# Patient Record
Sex: Female | Born: 1995 | Race: Black or African American | Hispanic: No | Marital: Single
Health system: Midwestern US, Community
[De-identification: ages and names within clinical notes are randomized; demographics above are authoritative.]

## PROBLEM LIST (undated history)

## (undated) DIAGNOSIS — F319 Bipolar disorder, unspecified: Secondary | ICD-10-CM

## (undated) DIAGNOSIS — F329 Major depressive disorder, single episode, unspecified: Secondary | ICD-10-CM

## (undated) DIAGNOSIS — F32A Depression, unspecified: Secondary | ICD-10-CM

## (undated) DIAGNOSIS — F913 Oppositional defiant disorder: Secondary | ICD-10-CM

## (undated) DIAGNOSIS — F909 Attention-deficit hyperactivity disorder, unspecified type: Secondary | ICD-10-CM

## (undated) DIAGNOSIS — I10 Essential (primary) hypertension: Secondary | ICD-10-CM

## (undated) DIAGNOSIS — F419 Anxiety disorder, unspecified: Secondary | ICD-10-CM

## (undated) DIAGNOSIS — K219 Gastro-esophageal reflux disease without esophagitis: Secondary | ICD-10-CM

## (undated) HISTORY — PX: NO PAST SURGERIES: SHX2092

## (undated) HISTORY — DX: Gastro-esophageal reflux disease without esophagitis: K21.9

## (undated) HISTORY — DX: Essential (primary) hypertension: I10

---
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
## 2017-05-03 ENCOUNTER — Ambulatory Visit: Payer: Medicaid Other | Admitting: Family Medicine

## 2017-06-01 ENCOUNTER — Encounter: Payer: Self-pay | Admitting: Emergency Medicine

## 2017-06-01 ENCOUNTER — Other Ambulatory Visit: Payer: Self-pay

## 2017-06-01 ENCOUNTER — Emergency Department
Admission: EM | Admit: 2017-06-01 | Discharge: 2017-06-02 | Disposition: A | Discharge status: Home / Self Care | Payer: Medicaid Other | Attending: Emergency Medicine | Admitting: Emergency Medicine

## 2017-06-01 DIAGNOSIS — Z87891 Personal history of nicotine dependence: Secondary | ICD-10-CM | POA: Diagnosis not present

## 2017-06-01 DIAGNOSIS — R4689 Other symptoms and signs involving appearance and behavior: Secondary | ICD-10-CM | POA: Insufficient documentation

## 2017-06-01 DIAGNOSIS — F909 Attention-deficit hyperactivity disorder, unspecified type: Secondary | ICD-10-CM | POA: Diagnosis not present

## 2017-06-01 DIAGNOSIS — F3132 Bipolar disorder, current episode depressed, moderate: Secondary | ICD-10-CM | POA: Insufficient documentation

## 2017-06-01 DIAGNOSIS — Z008 Encounter for other general examination: Secondary | ICD-10-CM | POA: Diagnosis present

## 2017-06-01 LAB — URINE DRUG SCREEN, QUALITATIVE (ARMC ONLY)
Amphetamines, Ur Screen: NOT DETECTED
BARBITURATES, UR SCREEN: NOT DETECTED
Benzodiazepine, Ur Scrn: NOT DETECTED
CANNABINOID 50 NG, UR ~~LOC~~: NOT DETECTED
Cocaine Metabolite,Ur ~~LOC~~: NOT DETECTED
MDMA (Ecstasy)Ur Screen: NOT DETECTED
METHADONE SCREEN, URINE: NOT DETECTED
Opiate, Ur Screen: NOT DETECTED
Phencyclidine (PCP) Ur S: NOT DETECTED
TRICYCLIC, UR SCREEN: NOT DETECTED

## 2017-06-01 LAB — BASIC METABOLIC PANEL
Anion gap: 10 (ref 5–15)
BUN: 12 mg/dL (ref 6–20)
CALCIUM: 9.1 mg/dL (ref 8.9–10.3)
CHLORIDE: 103 mmol/L (ref 101–111)
CO2: 23 mmol/L (ref 22–32)
Creatinine, Ser: 0.71 mg/dL (ref 0.44–1.00)
GFR calc non Af Amer: >60 mL/min (ref >60)
Glucose, Bld: 94 mg/dL (ref 65–99)
Potassium: 3.9 mmol/L (ref 3.5–5.1)
Sodium: 136 mmol/L (ref 135–145)

## 2017-06-01 LAB — CBC WITH DIFFERENTIAL/PLATELET
BASOS PCT: 1 %
Basophils Absolute: 0 10*3/uL (ref 0–0.1)
Eosinophils Absolute: 0 10*3/uL (ref 0–0.7)
Eosinophils Relative: 0 %
HEMATOCRIT: 37 % (ref 35.0–47.0)
HEMOGLOBIN: 11.9 g/dL — AB (ref 12.0–16.0)
Lymphocytes Relative: 23 %
Lymphs Abs: 1.5 10*3/uL (ref 1.0–3.6)
MCH: 24.5 pg — ABNORMAL LOW (ref 26.0–34.0)
MCHC: 32.3 g/dL (ref 32.0–36.0)
MCV: 76.1 fL — ABNORMAL LOW (ref 80.0–100.0)
MONOS PCT: 8 %
Monocytes Absolute: 0.5 10*3/uL (ref 0.2–0.9)
NEUTROS ABS: 4.6 10*3/uL (ref 1.4–6.5)
NEUTROS PCT: 68 %
Platelets: 324 10*3/uL (ref 150–440)
RBC: 4.87 MIL/uL (ref 3.80–5.20)
RDW: 17.9 % — AB (ref 11.5–14.5)
WBC: 6.7 10*3/uL (ref 3.6–11.0)

## 2017-06-01 LAB — ETHANOL: Alcohol, Ethyl (B): <10 mg/dL (ref <10)

## 2017-06-01 NOTE — ED Provider Notes (Signed)
[facility] Emergency Department Provider Note ____________________________________________   First MD Initiated Contact with Patient 06/01/17 2225     (approximate)  I have reviewed the triage vital signs and the nursing notes.   HISTORY  Chief Complaint Behavioral Med evaluation    HPI Barbara Benton is a 22 y.o. female with history of bipolar, depression, and ADHD who presents a for evaluation of a behavioral disturbance.  The patient states she has been increasingly irritated and had an outburst today where she was yelling and being on the walls and counter at home due to frustration with her mother.  Patient states that her mother thinks she is not taking her medications, although she states she is taking them although some of them not as often as she is supposed to be.    She is here voluntarily and states she would like to be evaluated to help make sure that she is on appropriate doses of her medications.  She denies SI or HI.  She denies any acute medical symptoms.   Past Medical History:  Diagnosis Date  . ADHD (attention deficit hyperactivity disorder)   . Anxiety   . Bipolar disorder (HCC)   . Depression   . ODD (oppositional defiant disorder)     Patient Active Problem List   Diagnosis Date Noted  . 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 No pertinent surgical history.  Prior to Admission medications   Not on File    Allergies Patient has no known allergies.  No family history on file.  Social History Social History   Tobacco Use  . Smoking status: Former Games developer  . Smokeless tobacco: Never Used  Substance Use Topics  . Alcohol use: No  . Drug use: Yes    Types: Marijuana, Methamphetamines, MDMA (Ecstacy)    Comment: Per mom past hx: No use since 11/2012(?)    Review of Systems  Constitutional: No fever. Eyes: No redness. ENT: No neck pain. Cardiovascular:  Denies chest pain. Respiratory: Denies shortness of breath. Gastrointestinal: No vomiting.  Genitourinary: Negative for dysuria.  Musculoskeletal: Negative for back pain. Skin: Negative for rash. Neurological: Negative for headache.   ____________________________________________   PHYSICAL EXAM:  VITAL SIGNS: ED Triage Vitals  Enc Vitals Group     BP 06/01/17 2150 140/79     Pulse Rate 06/01/17 2150 82     Resp 06/01/17 2150 18     Temp 06/01/17 2150 98 F (36.7 C)     Temp Source 06/01/17 2150 Oral     SpO2 06/01/17 2150 99 %     Weight 06/01/17 2143 250 lb (113.4 kg)     Height 06/01/17 2143  (1.549 m)     Head Circumference --      Peak Flow --      Pain Score 06/01/17 2143 0     Pain Loc --      Pain Edu? --      Excl. in GC? --     Constitutional: Alert and oriented. Well appearing and in no acute distress. Eyes: Conjunctivae are normal.  Head: Atraumatic. Nose: No congestion/rhinnorhea. Mouth/Throat: Mucous membranes are moist.   Neck: Normal range of motion.  Cardiovascular:  Good peripheral circulation. Respiratory: Normal respiratory effort.  Gastrointestinal:  No distention.  Musculoskeletal:  Extremities warm and well perfused.  Neurologic:  Normal speech and language. No gross focal neurologic deficits are appreciated.  Skin:  Skin is  warm and dry. No rash noted. Psychiatric: Speech and behavior are normal.  ____________________________________________   LABS (all labs ordered are listed, but only abnormal results are displayed)  Labs Reviewed  CBC WITH DIFFERENTIAL/PLATELET - Abnormal; Notable for the following components:      Result Value   Hemoglobin 11.9 (*)    MCV 76.1 (*)    MCH 24.5 (*)    RDW 17.9 (*)    All other components within normal limits  BASIC METABOLIC PANEL  ETHANOL  URINE DRUG SCREEN, QUALITATIVE ([facility] ONLY)    ____________________________________________  EKG   ____________________________________________  RADIOLOGY    ____________________________________________   PROCEDURES  Procedure(s) performed: No  Procedures  Critical Care performed: No ____________________________________________   INITIAL IMPRESSION / ASSESSMENT AND PLAN / ED COURSE  Pertinent labs & imaging results that were available during my care of the patient were reviewed by me and considered in my medical decision making (see chart for details).  22 year old female with past medical history as noted above presents voluntarily for evaluation after a behavioral disturbance at home, and there is a question of whether she is fully compliant with her medications.  On exam, the patient is comfortable appearing, vital signs are normal, and the exam is unremarkable.  She is calm and cooperative at this time.  She denies SI or HI.  There is no evidence of acute danger to self or others.  Plan: Labs for medical clearance, [facility] tele-psych evaluation, and reassess.  ----------------------------------------- 10:56 PM on 06/01/2017 -----------------------------------------  Lab work-up is unremarkable.  Patient still pending UDS.  I am signing the patient out to the oncoming physician Dr. Dolores Frame.      ____________________________________________   FINAL CLINICAL IMPRESSION(S) / ED DIAGNOSES  Final diagnoses:  Behavior concern      NEW MEDICATIONS STARTED DURING THIS VISIT:  New Prescriptions   No medications on file     Note:  This document was prepared using Dragon voice recognition software and may include unintentional dictation errors.    Dionne Bucy, MD 06/01/17 2257

## 2017-06-01 NOTE — ED Notes (Signed)
BEHAVIORAL HEALTH ROUNDING  Patient sleeping: No.  Patient alert and oriented: yes  Behavior appropriate: Yes. ; If no, describe:  Nutrition and fluids offered: Yes  Toileting and hygiene offered: Yes  Sitter present: not applicable, Q 15 min safety rounds and observation.  Law enforcement present: Yes ODS  

## 2017-06-01 NOTE — ED Notes (Signed)

## 2017-06-01 NOTE — ED Notes (Signed)
Pt dressed out in triage at this time by this RN and Alyssa EDT. Pt has one pair of yellow sandals, one pair of gray hoop earrings, gray shirt with sports bra, and one pair jean Capris.

## 2017-06-01 NOTE — ED Triage Notes (Signed)
Pt arrives ambulatory to triage with c/o needing blood work to prove to her mother that she is taking her psych med's as scheduled. Per [employer], pt was having an outburst where she was yelling and banging on the walls at home. Pt is acting calm and cooperative at this time in triage. Pt is voluntary at this time.

## 2017-06-02 NOTE — Discharge Instructions (Addendum)
Continue all medicines as directed by your doctor.  Return to the ER for worsening symptoms, feelings of hurting yourself or others, or other concerns. °

## 2017-06-02 NOTE — ED Notes (Signed)
BEHAVIORAL HEALTH ROUNDING  Patient sleeping: No.  Patient alert and oriented: yes  Behavior appropriate: Yes. ; If no, describe:  Nutrition and fluids offered: Yes  Toileting and hygiene offered: Yes  Sitter present: not applicable, Q 15 min safety rounds and observation.  Law enforcement present: Yes ODS  

## 2017-06-02 NOTE — ED Provider Notes (Signed)
-----------------------------------------   2:28 AM on 06/02/2017 -----------------------------------------  Patient was evaluated by [facility] psychiatrist Dr. Rob Bunting who deems her psychiatrically stable for discharge with outpatient mental health follow-up.  No new medication recommendations.  Strict return precautions given.  Patient verbalizes understanding and agrees with plan of care.   Irean Hong, MD 06/02/17 [phone]

## 2017-06-02 NOTE — ED Notes (Signed)
Report given to SOC MD.  

## 2017-06-06 ENCOUNTER — Ambulatory Visit: Payer: Medicaid Other | Admitting: Family Medicine

## 2017-07-18 ENCOUNTER — Other Ambulatory Visit: Payer: Self-pay

## 2017-07-18 ENCOUNTER — Emergency Department
Admission: EM | Admit: 2017-07-18 | Discharge: 2017-07-18 | Disposition: A | Discharge status: Home / Self Care | Payer: Medicaid Other | Attending: Emergency Medicine | Admitting: Emergency Medicine

## 2017-07-18 ENCOUNTER — Ambulatory Visit (HOSPITAL_COMMUNITY)
Admission: EM | Admit: 2017-07-18 | Discharge: 2017-07-18 | Disposition: A | Discharge status: Home / Self Care | Payer: No Typology Code available for payment source | Source: Ambulatory Visit | Attending: Emergency Medicine | Admitting: Emergency Medicine

## 2017-07-18 DIAGNOSIS — F909 Attention-deficit hyperactivity disorder, unspecified type: Secondary | ICD-10-CM | POA: Diagnosis not present

## 2017-07-18 DIAGNOSIS — F419 Anxiety disorder, unspecified: Secondary | ICD-10-CM | POA: Diagnosis not present

## 2017-07-18 DIAGNOSIS — F4325 Adjustment disorder with mixed disturbance of emotions and conduct: Secondary | ICD-10-CM | POA: Diagnosis not present

## 2017-07-18 DIAGNOSIS — T7421XA Adult sexual abuse, confirmed, initial encounter: Secondary | ICD-10-CM

## 2017-07-18 DIAGNOSIS — F913 Oppositional defiant disorder: Secondary | ICD-10-CM | POA: Diagnosis not present

## 2017-07-18 DIAGNOSIS — F319 Bipolar disorder, unspecified: Secondary | ICD-10-CM | POA: Diagnosis not present

## 2017-07-18 DIAGNOSIS — T7621XA Adult sexual abuse, suspected, initial encounter: Secondary | ICD-10-CM | POA: Diagnosis present

## 2017-07-18 DIAGNOSIS — F17210 Nicotine dependence, cigarettes, uncomplicated: Secondary | ICD-10-CM | POA: Diagnosis not present

## 2017-07-18 DIAGNOSIS — Z0441 Encounter for examination and observation following alleged adult rape: Secondary | ICD-10-CM | POA: Insufficient documentation

## 2017-07-18 LAB — COMPREHENSIVE METABOLIC PANEL
ALBUMIN: 4.2 g/dL (ref 3.5–5.0)
ALT: 29 U/L (ref 0–44)
AST: 29 U/L (ref 15–41)
Alkaline Phosphatase: 125 U/L (ref 38–126)
Anion gap: 9 (ref 5–15)
BUN: 11 mg/dL (ref 6–20)
CHLORIDE: 101 mmol/L (ref 98–111)
CO2: 26 mmol/L (ref 22–32)
Calcium: 9.3 mg/dL (ref 8.9–10.3)
Creatinine, Ser: 0.76 mg/dL (ref 0.44–1.00)
GFR calc Af Amer: >60 mL/min (ref >60)
GLUCOSE: 115 mg/dL — AB (ref 70–99)
Potassium: 3.1 mmol/L — ABNORMAL LOW (ref 3.5–5.1)
Sodium: 136 mmol/L (ref 135–145)
Total Bilirubin: 1.2 mg/dL (ref 0.3–1.2)
Total Protein: 8.7 g/dL — ABNORMAL HIGH (ref 6.5–8.1)

## 2017-07-18 LAB — RAPID HIV SCREEN (HIV 1/2 AB+AG)
HIV 1/2 Antibodies: NONREACTIVE
HIV-1 P24 Antigen - HIV24: NONREACTIVE

## 2017-07-18 LAB — POCT PREGNANCY, URINE: PREG TEST UR: NEGATIVE

## 2017-07-18 MED ORDER — PROMETHAZINE HCL 25 MG PO TABS
25.0000 mg | ORAL_TABLET | Freq: Four times a day (QID) | ORAL | Status: DC | PRN
  Filled 2017-07-18: qty 1

## 2017-07-18 MED ORDER — ELVITEG-COBIC-EMTRICIT-TENOFAF 150-150-200-10 MG PO TABS: 1.0000 | ORAL_TABLET | Freq: Every day | ORAL | 0 refills | Status: DC

## 2017-07-18 MED ORDER — ONDANSETRON HCL 4 MG PO TABS
4.0000 mg | ORAL_TABLET | Freq: Once | ORAL | Status: AC
  Administered 2017-07-18: 4 mg via ORAL
  Filled 2017-07-18: qty 1

## 2017-07-18 MED ORDER — ULIPRISTAL ACETATE 30 MG PO TABS
30.0000 mg | ORAL_TABLET | Freq: Once | ORAL | Status: AC
  Administered 2017-07-18: 30 mg via ORAL
  Filled 2017-07-18: qty 1

## 2017-07-18 MED ORDER — METRONIDAZOLE 500 MG PO TABS
2000.0000 mg | ORAL_TABLET | Freq: Once | ORAL | Status: AC
  Administered 2017-07-18: 2000 mg via ORAL
  Filled 2017-07-18: qty 4

## 2017-07-18 MED ORDER — CEFTRIAXONE SODIUM 250 MG IJ SOLR
250.0000 mg | Freq: Once | INTRAMUSCULAR | Status: AC
  Administered 2017-07-18: 250 mg via INTRAMUSCULAR
  Filled 2017-07-18: qty 250

## 2017-07-18 MED ORDER — LIDOCAINE HCL (PF) 1 % IJ SOLN
0.9000 mL | Freq: Once | INTRAMUSCULAR | Status: AC
  Administered 2017-07-18: 0.9 mL
  Filled 2017-07-18: qty 5

## 2017-07-18 MED ORDER — AZITHROMYCIN 500 MG PO TABS
1000.0000 mg | ORAL_TABLET | Freq: Once | ORAL | Status: AC
  Administered 2017-07-18: 1000 mg via ORAL
  Filled 2017-07-18: qty 2

## 2017-07-18 MED ORDER — ELVITEG-COBIC-EMTRICIT-TENOFAF 150-150-200-10 MG PO TABS
1.0000 | ORAL_TABLET | Freq: Every day | ORAL | Status: DC
  Administered 2017-07-18: 1 via ORAL
  Filled 2017-07-18: qty 1
  Filled 2017-07-18: qty 28
  Filled 2017-07-18 (×2): qty 1

## 2017-07-18 MED ORDER — PROPRANOLOL HCL 20 MG PO TABS
20.0000 mg | ORAL_TABLET | Freq: Once | ORAL | Status: DC
  Filled 2017-07-18: qty 1

## 2017-07-18 NOTE — ED Notes (Signed)
Pt informed that SANE nurse will be to [facility] ED once finishing another case on another campus. Pt verbalized understanding. [employer] remains at bedside.

## 2017-07-18 NOTE — ED Notes (Signed)
SANE nurse reviewed discharge paperwork with pt. Pt verbalized understanding follow ups and need to to take medications.
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
## 2017-07-18 NOTE — SANE Note (Signed)
ON 07/18/2017, AT APPROXIMATELY 1616 HOURS, THE FOLLOWING VOUCHER NUMBERS WERE OBTAINED FROM THE ADVANCING ACCESS PROGRAM (AAP) / [facility] iASSIST PORTAL.  THE VOUCHER NUMBERS WERE THEN EMAILED TO THE [facility] FOR THE 28-DAY SUPPLY OF GENVOYA (HIV nPEP):  BIN:  [id number]  PCN:  [id number]  GROUP:  [id number]  MEMBER #:  [id number]

## 2017-07-18 NOTE — ED Notes (Signed)
SANE at bedside

## 2017-07-18 NOTE — ED Notes (Signed)
Pt inquiring about what medications she took. This RN contacted SANE RN, Vernona RiegerLaura, who is compiling med list to give to pt.

## 2017-07-18 NOTE — ED Notes (Signed)
Labs collected by SANE nurse.
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
## 2017-07-18 NOTE — Discharge Instructions (Signed)
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 treatment. Having sex can infect your sexual partner. Calcium may bind to this medicine and cause lung or kidney problems. Avoid calcium products while taking this medicine and for 48 hours after taking the last dose of this medicine. What side effects may I notice from receiving this medicine? Side effects that you should report to your doctor  or health care professional as soon as possible: -allergic reactions like skin rash, itching or hives, swelling of the face, lips, or tongue -breathing problems -fever, chills -irregular heartbeat -pain when passing urine -seizures -stomach pain, cramps -unusual bleeding, bruising -unusually weak or tired Side effects that usually do not require medical attention (report to your doctor or health care professional if they continue or are bothersome): -diarrhea -dizzy, drowsy -headache -nausea,  vomiting -pain, swelling, irritation where injected -stomach upset -sweating This list may not describe all possible side effects. Call your doctor for medical advice about side effects. You may report side effects to FDA at 1-800-FDA-1088. Where should I keep my medicine? Keep out of the reach of children. Store at room temperature below 25 degrees C (77 degrees F). Protect from light. Throw away any unused vials after the expiration date. NOTE: This sheet is a summary. It may not cover all possible information. If you have questions about this medicine, talk to your doctor, pharmacist, or health care provider.  2017 Elsevier/Gold Standard (2013-07-20 09:14:54)     Cobicistat; Elvitegravir; Emtricitabine; Tenofovir Alafenamide oral tablets   What is this medicine? COBICISTAT; ELVITEGRAVIR; EMTRICITABINE; TENOFOVIR ALAFENAMIDE (koe BIS i stat; el vye TEG ra veer; em tri SIT uh bean; te NOE fo veer) is three antiretroviral medicines and a medication booster in one tablet. It is used to treat HIV. This medicine is not a cure for HIV. It will not stop the spread of HIV to others. This medicine may be used for other purposes; ask your health care provider or pharmacist if you have questions. COMMON BRAND NAME(S): Genvoya  What should I tell my health care provider before I take this medicine? They need to know if you have any of these conditions: -kidney disease -liver disease -an unusual or allergic reaction to cobicistat, elvitegravir, emtricitabine, tenofovir, other medicines, foods, dyes, or preservatives -pregnant or trying to get pregnant -breast-feeding  How should I use this medicine? Take this medicine by mouth with a glass of water. Follow the directions on the prescription label. Take this medicine with food. Take your medicine at regular intervals. Do not take your medicine more often than directed. For your anti-HIV therapy to work as well as possible, take each dose exactly  as prescribed. Do not skip doses or stop your medicine even if you feel better. Skipping doses may make the HIV virus resistant to this medicine and other medicines. Do not stop taking except on your doctor's advice. Talk to your pediatrician regarding the use of this medicine in children. While this drug may be prescribed for selected conditions, precautions do apply. Overdosage: If you think you have taken too much of this medicine contact a poison control center or emergency room at once. NOTE: This medicine is only for you. Do not share this medicine with others.  What if I miss a dose? If you miss a dose, take it as soon as you can. If it is almost time for your next dose, take only that dose. Do not take double or extra doses.  What may interact with this medicine? Do not take this medicine with any of the following medications: -adefovir -alfuzosin -certain medicines for seizures like carbamazepine, phenobarbital, phenytoin -cisapride -lumacaftor; ivacaftor -lurasidone -medicines for cholesterol like lovastatin, simvastatin -medicines for headaches like dihydroergotamine, ergotamine, methylergonovine -midazolam -other antiviral medicines for HIV or AIDS -pimozide -rifampin -sildenafil -St. John's wort -triazolam This medicine may also interact with the following medications: -antacids -atorvastatin -bosentan -buprenorphine; naloxone -  certain antibiotics like clarithromycin, telithromycin, rifabutin, rifapentine -certain medications for anxiety or sleep like buspirone, clorazepate, diazepam, estazolam, flurazepam, zolpidem -certain medicines for blood pressure or heart disease like amlodipine, diltiazem, felodipine, metoprolol, nicardipine, nifedipine, timolol, verapamil -certain medicines for depression, anxiety, or psychiatric disturbances -certain medicines for erectile dysfunction like avanafil, sildenafil, tadalafil, vardenafil -certain medicines for fungal infection  like itraconazole, ketoconazole, voriconazole -colchicine -cyclosporine -dexamethasone -female hormones, like estrogens and progestins and birth control pills -fluticasone -medicines for infection like acyclovir, cidofovir, valacyclovir, ganciclovir, valganciclovir -medicines for irregular heart beat like amiodarone, bepridil, digoxin, disopyramide, dofetilide, flecainide, lidocaine, mexiletine, propafenone, quinidine -metformin -oxcarbazepine -phenothiazines like perphenazine, risperidone, thioridazine -salmeterol -sirolimus -tacrolimus -warfarin This list may not describe all possible interactions. Give your health care provider a list of all the medicines, herbs, non-prescription drugs, or dietary supplements you use. Also tell them if you smoke, drink alcohol, or use illegal drugs. Some items may interact with your medicine.  What should I watch for while using this medicine? Visit your doctor or health care professional for regular check ups. Discuss any new symptoms with your doctor. You will need to have important blood work done while on this medicine. HIV is spread to others through sexual or blood contact. Talk to your doctor about how to stop the spread of HIV. If you have hepatitis B, talk to your doctor if you plan to stop this medicine. The symptoms of hepatitis B may get worse if you stop this medicine. Birth control pills may not work properly while you are taking this medicine. Talk to your doctor about using an extra method of birth control. Women who can still have children must use a reliable form of barrier contraception, like a condom.  What side effects may I notice from receiving this medicine? Side effects that you should report to your doctor or health care professional as soon as possible: -allergic reactions like skin rash, itching or hives, swelling of the face, lips, or tongue -breathing problems -fast, irregular heartbeat -muscle pain or weakness -signs and  symptoms of kidney injury like trouble passing urine or change in the amount of urine -signs and symptoms of liver injury like dark yellow or brown urine; general ill feeling or flu-like symptoms; light-colored stools; loss of appetite; right upper belly pain; unusually weak or tired; yellowing of the eyes or skin Side effects that usually do not require medical attention (report to your doctor or health care professional if they continue or are bothersome): -diarrhea -headache -nausea -tiredness This list may not describe all possible side effects. Call your doctor for medical advice about side effects. You may report side effects to FDA at 1-800-FDA-1088.  Where should I keep my medicine? Keep out of the reach of children. Store at room temperature below 30 degrees C (86 degrees F). Throw away any unused medicine after the expiration date. NOTE: This sheet is a summary. It may not cover all possible information. If you have questions about this medicine, talk to your doctor, pharmacist, or health care provider.  2018 Elsevier/Gold Standard (2015-10-17 12:54:04)
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
## 2017-07-18 NOTE — Care Management (Signed)
[place] telephone call from [place], [phone]) SANE nurse representative.  Requesting the 21 day supply of medication from [facility]. Discussed with Shawnee KnappSheema, [facility] pharmacy representative. [facility] will dispense and be reimbursed. Gwenette GreetBrenda S Shondrika Hoque RN MSN CCM Care Management [phone]
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
## 2017-07-20 LAB — HEPATITIS C ANTIBODY

## 2017-07-20 LAB — RPR: RPR: NONREACTIVE

## 2017-07-20 LAB — HEPATITIS B SURFACE ANTIGEN: HEP B S AG: NEGATIVE

## 2017-07-27 DIAGNOSIS — F333 Major depressive disorder, recurrent, severe with psychotic symptoms: Secondary | ICD-10-CM | POA: Insufficient documentation

## 2017-07-27 DIAGNOSIS — F411 Generalized anxiety disorder: Secondary | ICD-10-CM | POA: Insufficient documentation

## 2017-09-10 ENCOUNTER — Encounter (HOSPITAL_COMMUNITY): Payer: Self-pay

## 2017-09-10 DIAGNOSIS — F17210 Nicotine dependence, cigarettes, uncomplicated: Secondary | ICD-10-CM | POA: Insufficient documentation

## 2017-09-10 DIAGNOSIS — N898 Other specified noninflammatory disorders of vagina: Secondary | ICD-10-CM | POA: Diagnosis present

## 2017-09-10 DIAGNOSIS — Z79899 Other long term (current) drug therapy: Secondary | ICD-10-CM | POA: Diagnosis not present

## 2017-09-10 NOTE — ED Triage Notes (Signed)
Pt also hasn't had a period on two months

## 2017-09-10 NOTE — ED Triage Notes (Signed)
Pt complains of vaginal itching a burning for several days Pt wants tested for everything including herpes, BV, yeast infection, and STD's

## 2017-09-11 ENCOUNTER — Emergency Department (HOSPITAL_COMMUNITY)
Admission: EM | Admit: 2017-09-11 | Discharge: 2017-09-11 | Disposition: A | Discharge status: Home / Self Care | Payer: Medicaid Other | Attending: Emergency Medicine | Admitting: Emergency Medicine

## 2017-09-11 DIAGNOSIS — N898 Other specified noninflammatory disorders of vagina: Secondary | ICD-10-CM

## 2017-09-11 LAB — WET PREP, GENITAL
Trich, Wet Prep: NONE SEEN
Yeast Wet Prep HPF POC: NONE SEEN

## 2017-09-11 MED ORDER — METRONIDAZOLE 0.75 % VA GEL: 1.0000 | Freq: Two times a day (BID) | VAGINAL | 0 refills | Status: DC

## 2017-09-11 NOTE — ED Provider Notes (Signed)
[facility]-EMERGENCY DEPT Provider Note   CSN: [id number] Arrival date & time: 09/10/17  2253     History   Chief Complaint Chief Complaint  Patient presents with  . Vaginal Itching    HPI Barbara RodriguezHeaven Benton is a 22 y.o. female.   22 year old female with a history of depression, anxiety, bipolar disorder, ODD, ADHD presents to the emergency department for complaints of vaginal itching and burning.  Triage note reports this has been present for the past several days.  Is requesting testing for STDs.  Of note, the patient has presented on 4 previous occasions this month to outside hospitals for similar complaints.  She was prophylactically treated for gonorrhea, chlamydia, trichomonas on 08/21/2017.  Testing from this evaluation was all negative.  States that she has not had a period in 2 months, but has had 4 negative pregnancy tests since the end of July.  Patient unable to participate in much of the initial encounter; desires to sleep instead.     Past Medical History:  Diagnosis Date  . ADHD (attention deficit hyperactivity disorder)   . Anxiety   . Bipolar disorder ([facility])   . Depression   . ODD (oppositional defiant disorder)     Patient Active Problem List   Diagnosis Date Noted  . 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 No pertinent surgical history.   OB History   None      Home Medications    Prior to Admission medications   Medication Sig Start Date End Date Taking? Authorizing Provider  elvitegravir-cobicistat-emtricitabine-tenofovir (GENVOYA) 150-150-200-10 MG TABS tablet Take 1 tablet by mouth daily with breakfast. 07/18/17  Yes Sharman CheekStafford, Phillip, MD  metroNIDAZOLE (METROGEL) 0.75 % vaginal gel Place 1 Applicatorful vaginally 2 (two) times daily. 09/11/17   Antony MaduraHumes, Pranshu Lyster, PA-C    Family History History reviewed. No pertinent family history.  Social History Social History    Tobacco Use  . Smoking status: Current Every Day Smoker    Packs/day: 0.50    Types: Cigarettes  . Smokeless tobacco: Never Used  Substance Use Topics  . Alcohol use: Yes    Comment: occ  . Drug use: Not Currently    Types: Marijuana, Methamphetamines, MDMA (Ecstacy)    Comment: Per mom past hx: No use since 11/2012(?)     Allergies   Patient has no known allergies.   Review of Systems Review of Systems Ten systems reviewed and are negative for acute change, except as noted in the HPI.    Physical Exam Updated Vital Signs BP 122/74 (BP Location: Right Arm)   Pulse 79   Temp 98.2 F (36.8 C) (Oral)   Resp 16   LMP 06/22/2017   SpO2 98%   Physical Exam  Constitutional: She is oriented to person, place, and time. She appears well-developed and well-nourished. No distress.  Obese, nontoxic. Will fall back to sleep when not stimulated.  HENT:  Head: Normocephalic and atraumatic.  Eyes: Conjunctivae and EOM are normal. No scleral icterus.  Neck: Normal range of motion.  Pulmonary/Chest: Effort normal. No respiratory distress.  Respirations even and unlabored  Genitourinary:  Genitourinary Comments: No external genital lesions.  No vaginal discharge at the introitus.  Musculoskeletal: Normal range of motion.  Neurological: She is alert and oriented to person, place, and time. She exhibits normal muscle tone. Coordination normal.  Moving all extremities spontaneously.  Skin: Skin is warm and dry. No rash noted. She is not  diaphoretic. No erythema. No pallor.  Psychiatric: She has a normal mood and affect. Her behavior is normal.  Nursing note and vitals reviewed.    ED Treatments / Results  Labs (all labs ordered are listed, but only abnormal results are displayed) Labs Reviewed  WET PREP, GENITAL - Abnormal; Notable for the following components:      Result Value   Clue Cells Wet Prep HPF POC PRESENT (*)    WBC, Wet Prep HPF POC FEW (*)    All other  components within normal limits  GC/CHLAMYDIA PROBE AMP ([place]) NOT AT [facility]    EKG None  Radiology No results found.  Procedures Procedures (including critical care time)  Medications Ordered in ED Medications - No data to display   Initial Impression / Assessment and Plan / ED Course  I have reviewed the triage vital signs and the nursing notes.  Pertinent labs & imaging results that were available during my care of the patient were reviewed by me and considered in my medical decision making (see chart for details).     22 year old female presents for complaints of vaginal irritation.  This is her fifth visit to an emergency department in the past month for similar complaints.  She has had 3 previously negative pregnancy tests, most recently on 08/30/2017.  She has no complaints of lower abdominal pain.  She does not contribute much to her history as she is only interested in sleeping.  Patient afebrile with stable, reassuring vital signs.  She has clue cells on her wet prep consistent with likely bacterial vaginosis.  No other concerning features to indicate need for prophylactic STD coverage.  She was treated for gonorrhea, chlamydia, trichomonas on 08/21/2017.  We will have her follow-up with a primary care doctor.  Return precautions provided. Patient discharged in stable condition with no unaddressed concerns.   Final Clinical Impressions(s) / ED Diagnoses   Final diagnoses:  Vaginal irritation    ED Discharge Orders         Ordered    metroNIDAZOLE (METROGEL) 0.75 % vaginal gel  2 times daily     09/11/17 0145           Antony Madura, PA-C 09/11/17 0153    Molpus, Jonny Ruiz, MD 09/11/17 [phone]

## 2017-09-12 ENCOUNTER — Encounter (HOSPITAL_COMMUNITY): Payer: Self-pay

## 2017-09-12 ENCOUNTER — Emergency Department (HOSPITAL_COMMUNITY)
Admission: EM | Admit: 2017-09-12 | Discharge: 2017-09-12 | Disposition: A | Discharge status: Home / Self Care | Payer: Medicaid Other | Attending: Emergency Medicine | Admitting: Emergency Medicine

## 2017-09-12 ENCOUNTER — Emergency Department (HOSPITAL_COMMUNITY): Payer: Medicaid Other

## 2017-09-12 ENCOUNTER — Other Ambulatory Visit: Payer: Self-pay

## 2017-09-12 DIAGNOSIS — Y929 Unspecified place or not applicable: Secondary | ICD-10-CM | POA: Insufficient documentation

## 2017-09-12 DIAGNOSIS — Z87891 Personal history of nicotine dependence: Secondary | ICD-10-CM | POA: Diagnosis not present

## 2017-09-12 DIAGNOSIS — Y939 Activity, unspecified: Secondary | ICD-10-CM | POA: Diagnosis not present

## 2017-09-12 DIAGNOSIS — Y999 Unspecified external cause status: Secondary | ICD-10-CM | POA: Insufficient documentation

## 2017-09-12 DIAGNOSIS — M542 Cervicalgia: Secondary | ICD-10-CM | POA: Insufficient documentation

## 2017-09-12 DIAGNOSIS — Z79899 Other long term (current) drug therapy: Secondary | ICD-10-CM | POA: Diagnosis not present

## 2017-09-12 NOTE — Discharge Instructions (Signed)
Take over the counter tylenol, as directed on packaging, as needed for discomfort. Call your regular medical doctor tomorrow to schedule a follow up appointment this week.  Return to the Emergency Department immediately if worsening.
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
## 2017-09-13 LAB — GC/CHLAMYDIA PROBE AMP (~~LOC~~) NOT AT ARMC
CHLAMYDIA, DNA PROBE: NEGATIVE
NEISSERIA GONORRHEA: NEGATIVE

## 2017-10-10 ENCOUNTER — Other Ambulatory Visit: Payer: Self-pay

## 2017-10-10 ENCOUNTER — Emergency Department (HOSPITAL_COMMUNITY)
Admission: EM | Admit: 2017-10-10 | Discharge: 2017-10-11 | Disposition: A | Discharge status: Home / Self Care | Payer: Medicaid Other | Attending: Emergency Medicine | Admitting: Emergency Medicine

## 2017-10-10 ENCOUNTER — Emergency Department (HOSPITAL_COMMUNITY): Payer: Medicaid Other

## 2017-10-10 DIAGNOSIS — Y939 Activity, unspecified: Secondary | ICD-10-CM | POA: Insufficient documentation

## 2017-10-10 DIAGNOSIS — S022XXA Fracture of nasal bones, initial encounter for closed fracture: Secondary | ICD-10-CM | POA: Insufficient documentation

## 2017-10-10 DIAGNOSIS — Y999 Unspecified external cause status: Secondary | ICD-10-CM | POA: Diagnosis not present

## 2017-10-10 DIAGNOSIS — S0083XA Contusion of other part of head, initial encounter: Secondary | ICD-10-CM | POA: Diagnosis not present

## 2017-10-10 DIAGNOSIS — S0003XA Contusion of scalp, initial encounter: Secondary | ICD-10-CM | POA: Diagnosis not present

## 2017-10-10 DIAGNOSIS — Z87891 Personal history of nicotine dependence: Secondary | ICD-10-CM | POA: Diagnosis not present

## 2017-10-10 DIAGNOSIS — Y929 Unspecified place or not applicable: Secondary | ICD-10-CM | POA: Diagnosis not present

## 2017-10-10 DIAGNOSIS — S00511A Abrasion of lip, initial encounter: Secondary | ICD-10-CM | POA: Insufficient documentation

## 2017-10-10 DIAGNOSIS — M7989 Other specified soft tissue disorders: Secondary | ICD-10-CM | POA: Diagnosis not present

## 2017-10-10 DIAGNOSIS — Z79899 Other long term (current) drug therapy: Secondary | ICD-10-CM | POA: Diagnosis not present

## 2017-10-10 DIAGNOSIS — S0990XA Unspecified injury of head, initial encounter: Secondary | ICD-10-CM | POA: Diagnosis present
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
## 2017-10-10 NOTE — ED Notes (Signed)
[employer] with pt at this time
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
## 2017-10-10 NOTE — ED Notes (Signed)
ED Provider at bedside. 

## 2017-10-11 ENCOUNTER — Encounter (HOSPITAL_COMMUNITY): Payer: Self-pay | Admitting: Emergency Medicine

## 2017-10-11 LAB — URINALYSIS, ROUTINE W REFLEX MICROSCOPIC
BILIRUBIN URINE: NEGATIVE
Glucose, UA: NEGATIVE mg/dL
Hgb urine dipstick: NEGATIVE
KETONES UR: 20 mg/dL — AB
Leukocytes, UA: NEGATIVE
NITRITE: NEGATIVE
PROTEIN: 30 mg/dL — AB
Specific Gravity, Urine: 1.029 (ref 1.005–1.030)
pH: 5 (ref 5.0–8.0)

## 2017-10-11 LAB — RAPID URINE DRUG SCREEN, HOSP PERFORMED
Amphetamines: NOT DETECTED
BARBITURATES: NOT DETECTED
Benzodiazepines: NOT DETECTED
COCAINE: NOT DETECTED
Opiates: NOT DETECTED
Tetrahydrocannabinol: NOT DETECTED

## 2017-10-11 MED ORDER — IBUPROFEN 800 MG PO TABS
800.0000 mg | ORAL_TABLET | Freq: Once | ORAL | Status: AC
  Administered 2017-10-11: 800 mg via ORAL
  Filled 2017-10-11: qty 1

## 2017-10-11 NOTE — ED Notes (Signed)
Discharge instructions discussed with Pt. Pt verbalized understanding. Pt stable and ambulatory.    
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
## 2017-10-11 NOTE — ED Notes (Signed)
Pt given sandwich and soda.  
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
## 2017-10-11 NOTE — Discharge Instructions (Signed)
[facility], [facility]cher provided.

## 2017-10-15 NOTE — SANE Note (Signed)
FNE attempted to contact patient for follow-up phone call.  FNE received automated message that the phone call could not be completed at this time and to try again later.

## 2017-10-28 ENCOUNTER — Emergency Department (HOSPITAL_COMMUNITY)
Admission: EM | Admit: 2017-10-28 | Discharge: 2017-10-28 | Disposition: A | Discharge status: Home / Self Care | Payer: Medicaid Other | Attending: Emergency Medicine | Admitting: Emergency Medicine

## 2017-10-28 ENCOUNTER — Encounter (HOSPITAL_COMMUNITY): Payer: Self-pay | Admitting: Emergency Medicine

## 2017-10-28 ENCOUNTER — Other Ambulatory Visit: Payer: Self-pay

## 2017-10-28 DIAGNOSIS — F909 Attention-deficit hyperactivity disorder, unspecified type: Secondary | ICD-10-CM | POA: Diagnosis not present

## 2017-10-28 DIAGNOSIS — F419 Anxiety disorder, unspecified: Secondary | ICD-10-CM

## 2017-10-28 DIAGNOSIS — G479 Sleep disorder, unspecified: Secondary | ICD-10-CM

## 2017-10-28 DIAGNOSIS — F17210 Nicotine dependence, cigarettes, uncomplicated: Secondary | ICD-10-CM | POA: Diagnosis not present

## 2017-10-28 MED ORDER — HYDROXYZINE HCL 25 MG PO TABS: 25.0000 mg | ORAL_TABLET | Freq: Three times a day (TID) | ORAL | 0 refills | Status: DC | PRN

## 2017-10-28 MED ORDER — HYDROXYZINE HCL 25 MG PO TABS
25.0000 mg | ORAL_TABLET | Freq: Once | ORAL | Status: AC
  Administered 2017-10-28: 25 mg via ORAL
  Filled 2017-10-28: qty 1

## 2017-10-28 NOTE — ED Triage Notes (Addendum)
Pt very vague with answers.  Reports anxiety for "a while."  Denies SI.  Denies auditory and visual hallucinations.  Friend with pt states she hasn't slept in 2 days and wants him to stay close to her.  States she didn't want the lights turned out at home.  Unsure of LMP.

## 2017-10-28 NOTE — Discharge Instructions (Signed)
Take the prescribed medication as directed.  Try and make sure to get some sleep. Follow-up with one the counseling centers if you want some additional help with your anxiety.  Can call to make an appt. Return to the ED for new or worsening symptoms, specifically if you have any thoughts of wanting to harm yourself or others, start seeing things or hearing voices, etc.
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
## 2017-11-01 ENCOUNTER — Emergency Department (HOSPITAL_COMMUNITY)
Admission: EM | Admit: 2017-11-01 | Discharge: 2017-11-01 | Disposition: A | Discharge status: Home / Self Care | Payer: Medicaid Other | Attending: Emergency Medicine | Admitting: Emergency Medicine

## 2017-11-01 ENCOUNTER — Encounter (HOSPITAL_COMMUNITY): Payer: Self-pay | Admitting: Emergency Medicine

## 2017-11-01 DIAGNOSIS — F17210 Nicotine dependence, cigarettes, uncomplicated: Secondary | ICD-10-CM | POA: Diagnosis not present

## 2017-11-01 DIAGNOSIS — Z711 Person with feared health complaint in whom no diagnosis is made: Secondary | ICD-10-CM

## 2017-11-01 DIAGNOSIS — Z79899 Other long term (current) drug therapy: Secondary | ICD-10-CM | POA: Diagnosis not present

## 2017-11-01 DIAGNOSIS — R4689 Other symptoms and signs involving appearance and behavior: Secondary | ICD-10-CM | POA: Diagnosis not present

## 2017-11-01 DIAGNOSIS — R1084 Generalized abdominal pain: Secondary | ICD-10-CM | POA: Diagnosis present

## 2017-11-01 LAB — CBC
HCT: 46.3 % — ABNORMAL HIGH (ref 36.0–46.0)
HEMOGLOBIN: 14.1 g/dL (ref 12.0–15.0)
MCH: 27.3 pg (ref 26.0–34.0)
MCHC: 30.5 g/dL (ref 30.0–36.0)
MCV: 89.7 fL (ref 80.0–100.0)
NRBC: 0 % (ref 0.0–0.2)
Platelets: 360 10*3/uL (ref 150–400)
RBC: 5.16 MIL/uL — ABNORMAL HIGH (ref 3.87–5.11)
RDW: 16 % — ABNORMAL HIGH (ref 11.5–15.5)
WBC: 9.7 10*3/uL (ref 4.0–10.5)

## 2017-11-01 LAB — COMPREHENSIVE METABOLIC PANEL
ALBUMIN: 4 g/dL (ref 3.5–5.0)
ALT: 20 U/L (ref 0–44)
ANION GAP: 10 (ref 5–15)
AST: 19 U/L (ref 15–41)
Alkaline Phosphatase: 114 U/L (ref 38–126)
BILIRUBIN TOTAL: 0.4 mg/dL (ref 0.3–1.2)
BUN: 11 mg/dL (ref 6–20)
CO2: 25 mmol/L (ref 22–32)
Calcium: 9.2 mg/dL (ref 8.9–10.3)
Chloride: 106 mmol/L (ref 98–111)
Creatinine, Ser: 0.76 mg/dL (ref 0.44–1.00)
GFR calc non Af Amer: >60 mL/min (ref >60)
Glucose, Bld: 91 mg/dL (ref 70–99)
POTASSIUM: 4.1 mmol/L (ref 3.5–5.1)
Sodium: 141 mmol/L (ref 135–145)
TOTAL PROTEIN: 7.9 g/dL (ref 6.5–8.1)

## 2017-11-01 LAB — I-STAT BETA HCG BLOOD, ED (MC, WL, AP ONLY)

## 2017-11-01 LAB — LIPASE, BLOOD: Lipase: 43 U/L (ref 11–51)
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
## 2017-11-01 NOTE — ED Triage Notes (Signed)
Per EMS-multiple complains-states she has worms and she has vomited and "pooped" them out-states she has multiple colors of poop, thinks she is pregnant and lactating-wants a pap smear-states she wants saline to get the rest of the worms out

## 2017-11-01 NOTE — ED Notes (Addendum)
This patient is very upset with the way registration has treated her, but registration has not been in the room and a card was given to her to file a grievance. She repeatedly used this RN's name to describe the person that registered her, but I she did verbally tell me that the problem is with registration. Prior to this event, this RN had a brief, but pleasant interaction with the patient when I introduced myself and told her the PA would be in shortly. I offered a warm blanket and gave her the call bell.

## 2017-11-01 NOTE — ED Notes (Signed)
Pt called from the lobby with no response 

## 2017-11-01 NOTE — ED Provider Notes (Signed)
[facility]-EMERGENCY DEPT Provider Note   CSN: [id number] Arrival date & time: 11/01/17  1225     History   Chief Complaint Chief Complaint  Patient presents with  . multple complaints    HPI Barbara Benton is a 22 y.o. female who presents with abdominal pain, vomiting, and concern about having worms. PMH significant for multiple psychiatric issues. She states that she has abdominal pain and vomiting worms over the past couple days. She is a difficulty historian but states that she went to a hospital in [facility] and they gave her an "antibacterial" through "[facility]" for worms and then was told to take a laxative which she says she didn't take. She states that she vomited worms and defecated worms over the past couple days so she is here to receive treatment for worms and receive IV fluids to "flush out the worms". She does not have any confirmatory testing for worms in the ED. Additionally she complains of galactorrhea and thinks she is pregnant. She is also requesting STD testing. When asked if she was having any symptoms regarding this she states yes - she has black stool.   HPI  Past Medical History:  Diagnosis Date  . ADHD (attention deficit hyperactivity disorder)   . Anxiety   . Bipolar disorder (HCC)   . Depression   . ODD (oppositional defiant disorder)     Patient Active Problem List   Diagnosis Date Noted  . 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 No pertinent surgical history.   OB History   None      Home Medications    Prior to Admission medications   Medication Sig Start Date End Date Taking? Authorizing Provider  caffeine 200 MG TABS tablet Take 200 mg by mouth as needed (bloating).   Yes [provider]  Ferrous Sulfate (IRON) 325 (65 Fe) MG TABS Take 325 mg by mouth daily.   Yes [provider]  hydrOXYzine (ATARAX/VISTARIL) 25 MG tablet Take 1 tablet (25  mg total) by mouth every 8 (eight) hours as needed for anxiety. 10/28/17  Yes Garlon Hatchet, PA-C    Family History No family history on file.  Social History Social History   Tobacco Use  . Smoking status: Current Every Day Smoker    Packs/day: 0.50    Types: Cigarettes  . Smokeless tobacco: Never Used  Substance Use Topics  . Alcohol use: Yes    Comment: occ  . Drug use: Yes    Types: Marijuana, MDMA (Ecstacy)     Allergies   Patient has no known allergies.   Review of Systems Review of Systems  Gastrointestinal: Positive for abdominal pain and vomiting. Negative for constipation and diarrhea.  Genitourinary: Negative for dysuria and vaginal discharge.       +galactorrhea  Psychiatric/Behavioral: Positive for behavioral problems.  All other systems reviewed and are negative.    Physical Exam Updated Vital Signs BP 136/79 (BP Location: Left Arm)   Pulse 86   Temp 97.6 F (36.4 C) (Oral)   Resp 16   Ht 5\' 2"  (1.575 m)   Wt 103 kg   SpO2 100%   BMI 41.52 kg/m   Physical Exam  Constitutional: She is oriented to person, place, and time. She appears well-developed and well-nourished. No distress.  Obese. Initially calm and cooperative but then becomes verbally aggressive.  HENT:  Head: Normocephalic and atraumatic.  Eyes: Pupils are equal, round,  and reactive to light. Conjunctivae are normal. Right eye exhibits no discharge. Left eye exhibits no discharge. No scleral icterus.  Neck: Normal range of motion.  Cardiovascular: Normal rate and regular rhythm.  Pulmonary/Chest: Effort normal and breath sounds normal. No respiratory distress.  Abdominal: Soft. Bowel sounds are normal. She exhibits no distension. There is no tenderness.  Neurological: She is alert and oriented to person, place, and time.  Skin: Skin is warm and dry.  Psychiatric: Her speech is normal. Her mood appears anxious. Her affect is labile. She is aggressive. Thought content is paranoid and  delusional. She expresses impulsivity and inappropriate judgment. She expresses no homicidal and no suicidal ideation. She expresses no suicidal plans and no homicidal plans.  Nursing note and vitals reviewed.    ED Treatments / Results  Labs (all labs ordered are listed, but only abnormal results are displayed) Labs Reviewed  CBC - Abnormal; Notable for the following components:      Result Value   RBC 5.16 (*)    HCT 46.3 (*)    RDW 16.0 (*)    All other components within normal limits  LIPASE, BLOOD  COMPREHENSIVE METABOLIC PANEL  URINALYSIS, ROUTINE W REFLEX MICROSCOPIC  ETHANOL  RAPID URINE DRUG SCREEN, HOSP PERFORMED  I-STAT BETA HCG BLOOD, ED ([facility], [facility], [facility] ONLY)    EKG None  Radiology No results found.  Procedures Procedures (including critical care time)  Medications Ordered in ED Medications - No data to display   Initial Impression / Assessment and Plan / ED Course  I have reviewed the triage vital signs and the nursing notes.  Pertinent labs & imaging results that were available during my care of the patient were reviewed by me and considered in my medical decision making (see chart for details).  22 year old female presents with multiple complaints - mostly non-urgent and somewhat nonsensical. She insists she has worms in her throat and her abdomen and has been treated for this in the past. On review of EMR I do not see any record of this. I do see her last visit at [facility] in August when she was treated for a UTI but when I discussed this with her she became upset with me stating that this was a visit for treatment for worms. She also became very upset when I told her she wasn't pregnant and started to shout at me "why is milk coming out of my titties then". When I tried to discuss alternative reasons for this she would not listen and continued being verbally aggressive therefore I removed myself from the room at that point. Nursing staff informed me that she wants  to file a complaint that I called her a "N-word" under my breath as I left the room. Labs were reviewed and appear overall normal. Her abdominal exam is benign. I did offer stool studies but at this point since she is being aggressive towards me, I think she should be discharged. Although her requests are odd and confusing, I do not think she is acutely psychotic. She is not SI/HI. I discussed with my attending Dr. Broadus John. Will d/c.   Final Clinical Impressions(s) / ED Diagnoses   Final diagnoses:  Generalized abdominal pain  Aggressive behavior  Concern about digestive disease without diagnosis    ED Discharge Orders    None       Bethel Born, PA-C 11/01/17 1631    Arby Barrette, MD 11/08/17 [phone]

## 2017-11-26 DIAGNOSIS — F314 Bipolar disorder, current episode depressed, severe, without psychotic features: Secondary | ICD-10-CM | POA: Insufficient documentation

## 2017-11-26 DIAGNOSIS — F603 Borderline personality disorder: Secondary | ICD-10-CM | POA: Insufficient documentation

## 2017-11-26 DIAGNOSIS — Z8659 Personal history of other mental and behavioral disorders: Secondary | ICD-10-CM | POA: Insufficient documentation

## 2017-12-21 NOTE — SANE Note (Signed)
At 18:15 on 12/21/17, Ms. 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 She stated she needs a note in reference to her forensic case that states she can work at [employer]. "They Market researcher([employer]) are trying to play little funny games and trying to be nosy, I think. They are saying I have to bring them a note saying it's safe for me to work in that environment."  I explained to [name] that I could not provide her with a note of that nature and suggested she reach [facility] to [facility] to ascertain what information or note they could provide for her to give to her employer. She vocalized agreement and stated she would call back if she has any more questions or concerns.

## 2018-01-13 ENCOUNTER — Other Ambulatory Visit: Payer: Self-pay

## 2018-01-13 ENCOUNTER — Encounter (HOSPITAL_COMMUNITY): Payer: Self-pay | Admitting: Emergency Medicine

## 2018-01-13 ENCOUNTER — Emergency Department (HOSPITAL_COMMUNITY)
Admission: EM | Admit: 2018-01-13 | Discharge: 2018-01-14 | Disposition: A | Discharge status: Home / Self Care | Payer: Medicaid Other | Attending: Emergency Medicine | Admitting: Emergency Medicine

## 2018-01-13 DIAGNOSIS — F29 Unspecified psychosis not due to a substance or known physiological condition: Secondary | ICD-10-CM | POA: Insufficient documentation

## 2018-01-13 DIAGNOSIS — F17210 Nicotine dependence, cigarettes, uncomplicated: Secondary | ICD-10-CM | POA: Insufficient documentation

## 2018-01-13 DIAGNOSIS — F23 Brief psychotic disorder: Secondary | ICD-10-CM

## 2018-01-13 DIAGNOSIS — F4325 Adjustment disorder with mixed disturbance of emotions and conduct: Secondary | ICD-10-CM | POA: Diagnosis present

## 2018-01-13 DIAGNOSIS — F319 Bipolar disorder, unspecified: Secondary | ICD-10-CM | POA: Diagnosis not present

## 2018-01-13 LAB — COMPREHENSIVE METABOLIC PANEL WITH GFR
ALT: 29 U/L (ref 0–44)
AST: 24 U/L (ref 15–41)
Albumin: 4.2 g/dL (ref 3.5–5.0)
Alkaline Phosphatase: 111 U/L (ref 38–126)
Anion gap: 11 (ref 5–15)
BUN: 15 mg/dL (ref 6–20)
CO2: 24 mmol/L (ref 22–32)
Calcium: 9.2 mg/dL (ref 8.9–10.3)
Chloride: 104 mmol/L (ref 98–111)
Creatinine, Ser: 0.67 mg/dL (ref 0.44–1.00)
GFR calc Af Amer: >60 mL/min
GFR calc non Af Amer: >60 mL/min
Glucose, Bld: 98 mg/dL (ref 70–99)
Potassium: 4.2 mmol/L (ref 3.5–5.1)
Sodium: 139 mmol/L (ref 135–145)
Total Bilirubin: 0.4 mg/dL (ref 0.3–1.2)
Total Protein: 7.9 g/dL (ref 6.5–8.1)

## 2018-01-13 LAB — CBC
HEMATOCRIT: 46.7 % — AB (ref 36.0–46.0)
Hemoglobin: 14.4 g/dL (ref 12.0–15.0)
MCH: 27.6 pg (ref 26.0–34.0)
MCHC: 30.8 g/dL (ref 30.0–36.0)
MCV: 89.6 fL (ref 80.0–100.0)
NRBC: 0 % (ref 0.0–0.2)
Platelets: 310 10*3/uL (ref 150–400)
RBC: 5.21 MIL/uL — ABNORMAL HIGH (ref 3.87–5.11)
RDW: 14.3 % (ref 11.5–15.5)
WBC: 7.4 10*3/uL (ref 4.0–10.5)

## 2018-01-13 LAB — I-STAT BETA HCG BLOOD, ED (MC, WL, AP ONLY)

## 2018-01-13 LAB — ACETAMINOPHEN LEVEL: Acetaminophen (Tylenol), Serum: <10 ug/mL — ABNORMAL LOW (ref 10–30)

## 2018-01-13 LAB — SALICYLATE LEVEL: Salicylate Lvl: <7 mg/dL (ref 2.8–30.0)

## 2018-01-13 LAB — ETHANOL: Alcohol, Ethyl (B): <10 mg/dL (ref <10)

## 2018-01-13 MED ORDER — ZIPRASIDONE HCL 20 MG PO CAPS
20.0000 mg | ORAL_CAPSULE | Freq: Two times a day (BID) | ORAL | Status: DC
  Administered 2018-01-14: 20 mg via ORAL
  Filled 2018-01-13 (×2): qty 1

## 2018-01-13 MED ORDER — LORAZEPAM 2 MG/ML IJ SOLN
2.0000 mg | Freq: Once | INTRAMUSCULAR | Status: AC
  Administered 2018-01-13: 2 mg via INTRAVENOUS
  Filled 2018-01-13: qty 1

## 2018-01-13 MED ORDER — ZIPRASIDONE MESYLATE 20 MG IM SOLR
20.0000 mg | Freq: Once | INTRAMUSCULAR | Status: AC
  Administered 2018-01-13: 20 mg via INTRAMUSCULAR
  Filled 2018-01-13: qty 20

## 2018-01-13 MED ORDER — PROMETHAZINE HCL 25 MG PO TABS: 25.0000 mg | ORAL_TABLET | Freq: Once | ORAL | Status: DC

## 2018-01-13 MED ORDER — STERILE WATER FOR INJECTION IJ SOLN
INTRAMUSCULAR | Status: AC
  Administered 2018-01-13: 10 mL
  Filled 2018-01-13: qty 10

## 2018-01-13 NOTE — ED Notes (Signed)
Patient is cussing and yelling.
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
## 2018-01-13 NOTE — ED Notes (Signed)
[facility] Expected date:  Expected time:  Means of arrival:  Comments: ROOM 23

## 2018-01-13 NOTE — BH Assessment (Signed)
[facility]   01/13/18 - This writer attempted to see patient at 0745 patient is observed to be medicated due to agitation will be seen later this date.

## 2018-01-13 NOTE — ED Notes (Signed)
Pt dressed out in purple scrubs.  

## 2018-01-13 NOTE — ED Notes (Addendum)
Pt called this RN a "fucking bitch".  Stated she was on death row, that this RN was on execution row, and that I was going to die. After administering ativan, pt flinched and acted like she was going to hit this Charity fundraiserN.

## 2018-01-13 NOTE — BH Assessment (Signed)
[facility] Assessment Progress Note  Case was staffed with Lord DNP who recommended patient be observed and monitored for safety.     

## 2018-01-13 NOTE — ED Notes (Signed)
Pt woke up agitated and refused her medication.

## 2018-01-13 NOTE — ED Notes (Signed)
Pt escorted to room 35 by security.  Pt was somewhat cooperative.  She is very delusional and paranoid.  She believes she has been poisoned and that she is on death row.  Pt is beginning to get drowsy from her previous medications.

## 2018-01-13 NOTE — BH Assessment (Addendum)
Assessment Note  Barbara Benton is an 22 y.o. female that presents this date with psychosis reporting she was "poisoned with perfume." Patient is speaking in a loud pressured voice and is observed to be very angry. Patient is making threats to staff on admission and is refusing to participate in the assessment process. Patient renders limited history and keeps stating, "No, No, No" when this writer attempts to assess. Patient informs this Clinical research associate that she is "being held by the government" and ruminates on "agents of the government after her."  Patient denies any S/I, H/I or AVH and reports she is currently presenting for concerns associated with being poisoned. It is unclear if patient processes the content of this writer's questions. Patient is unable to be redirected and is unwilling to answer questions associated with assessment. Information to complete assessment was obtained from history per notes this date, "Patient has a history of psychosis not on medications who presents to the emergency department severely agitated, threatening multiple patients and staff. She states "people are trying to kill me", "people put perfume in my food to try to kill me". "I am in the process of getting my private gun 16-XWR license. " Patient informs staff that , " You are part of a government plot to try to kill young black African-American women." Per chart review patient was last assessed on 04/23/13. Per that note on 04/23/16, "Patient is a female with past medical history of depression, anxiety, bipolar disorder, ADHD and ODD who is brought in to the emergency department by police from her group home under her IVC with behavior problems" (04/23/13). History is limited on this patient if whether she has been receiving OP services at this time or on any mental health medication/s. Patient per history has a history of Cannabis use although UDS is pending this date. Case was staffed with Shaune Pollack DNP who recommended patient be observed  and monitored for safety.       Diagnosis: Unspecified psychosis   Past Medical History:  Past Medical History:  Diagnosis Date  . ADHD (attention deficit hyperactivity disorder)   . Anxiety   . Bipolar disorder (HCC)   . Depression   . ODD (oppositional defiant disorder)     History reviewed. No pertinent surgical history.  Family History: History reviewed. No pertinent family history.  Social History:  reports that she has been smoking cigarettes. She has been smoking about 0.50 packs per day. She has never used smokeless tobacco. She reports current alcohol use. She reports current drug use. Drugs: Marijuana and MDMA (Ecstacy).  Additional Social History:  Alcohol / Drug Use Pain Medications: None  Prescriptions: None  Over the Counter: None  History of alcohol / drug use?: Yes Longest period of sobriety (when/how long): (UTA) Negative Consequences of Use: (UTA) Withdrawal Symptoms: (Denies) Substance #1 Name of Substance 1: Cannabis per hx 1 - Age of First Use: UTA  1 - Amount (size/oz): UTA 1 - Frequency: UTA 1 - Duration: UTA 1 - Last Use / Amount: UTA UDS pending  CIWA: CIWA-Ar BP: (!) 157/98 Pulse Rate: 91 COWS:    Allergies: No Known Allergies  Home Medications: (Not in a hospital admission)   OB/GYN Status:  No LMP recorded.  General Assessment Data Assessment unable to be completed: Yes Reason for not completing assessment: pt is agitated and was medicated [facility] of Assessment: [facility] TTS Assessment: In system Is this a Tele or Face-to-Face Assessment?: Face-to-Face Is this an Initial Assessment or a Re-assessment  for this encounter?: Initial Assessment Patient Accompanied by:: (NA) Language Other than English: No Living Arrangements: Other (Comment)(UTA) What gender do you identify as?: Female Marital status: Single Maiden name: Slivka Pregnancy Status: No Living Arrangements: Other relatives Can pt return to current living arrangement?:  Yes Admission Status: Voluntary Is patient capable of signing voluntary admission?: Yes Referral Source: Self/Family/Friend Insurance type: Medicaid     Crisis Care Plan Living Arrangements: Other relatives Legal Guardian: (NA) Name of Psychiatrist: UTA Name of Therapist: UTA  Education Status Is patient currently in school?: No Is the patient employed, unemployed or receiving disability?: Unemployed  Risk to self with the past 6 months Suicidal Ideation: No Has patient been a risk to self within the past 6 months prior to admission? : No Suicidal Intent: No Has patient had any suicidal intent within the past 6 months prior to admission? : No Is patient at risk for suicide?: No, but patient needs Medical Clearance Suicidal Plan?: No Has patient had any suicidal plan within the past 6 months prior to admission? : No Access to Means: No What has been your use of drugs/alcohol within the last 12 months?: NA Previous Attempts/Gestures: No How many times?: 0 Other Self Harm Risks: NA Triggers for Past Attempts: Unknown Intentional Self Injurious Behavior: None Family Suicide History: No Recent stressful life event(s): Other (Comment)(Living in homeless shelter) Persecutory voices/beliefs?: No Depression: No Depression Symptoms: (Denies) Substance abuse history and/or treatment for substance abuse?: No Suicide prevention information given to non-admitted patients: Not applicable  Risk to Others within the past 6 months Homicidal Ideation: No Does patient have any lifetime risk of violence toward others beyond the six months prior to admission? : No Thoughts of Harm to Others: No Current Homicidal Intent: No Current Homicidal Plan: No Access to Homicidal Means: No Identified Victim: NA History of harm to others?: No Assessment of Violence: On admission Violent Behavior Description: Threats to staff Does patient have access to weapons?: No Criminal Charges Pending?:  (UTA) Does patient have a court date: (Unknown) Is patient on probation?: Unknown  Psychosis Hallucinations: None noted Delusions: None noted  Mental Status Report Appearance/Hygiene: In scrubs Eye Contact: Fair Motor Activity: Agitation Speech: Pressured, Loud Level of Consciousness: Irritable Mood: Threatening Affect: Angry Anxiety Level: Severe Thought Processes: Unable to Assess Judgement: Unable to Assess Orientation: Unable to assess Obsessive Compulsive Thoughts/Behaviors: None  Cognitive Functioning Concentration: Unable to Assess Memory: Unable to Assess Is patient IDD: No Insight: Unable to Assess Impulse Control: Unable to Assess Appetite: (UTA) Have you had any weight changes? : (UTA) Sleep: (UTA) Total Hours of Sleep: (UTA) Vegetative Symptoms: None  ADLScreening [facility] Assessment Services) Patient's cognitive ability adequate to safely complete daily activities?: Yes Patient able to express need for assistance with ADLs?: Yes Independently performs ADLs?: Yes (appropriate for developmental age)  Prior Inpatient Therapy Prior Inpatient Therapy: Yes(Per notes) Prior Therapy Dates: 2015, 2014 Prior Therapy [facility]ilty/Provider(s[facility](Per hx) Reason for Treatment: MH issues  Prior Outpatient Therapy Prior Outpatient Therapy: Yes(Per hx) Prior Therapy Dates: 2015 Prior Therapy Facilty/Provider(s): [facility](Per hx) Reason for Treatment: Counseling(Per notes) Does patient have an ACCT team?: No Does patient have Intensive In-House Services?  : No Does patient have [facility] services? : No Does patient have P4CC services?: No  ADL Screening (condition at time of admission) Patient's cognitive ability adequate to safely complete daily activities?: Yes Is the patient deaf or have difficulty hearing?: No Does the patient have difficulty seeing, even when wearing glasses/contacts?: No Does  the patient have difficulty concentrating, remembering,  or making decisions?: No Patient able to express need for assistance with ADLs?: Yes Does the patient have difficulty dressing or bathing?: No Independently performs ADLs?: Yes (appropriate for developmental age) Does the patient have difficulty walking or climbing stairs?: No Weakness of Legs: None Weakness of Arms/Hands: None  Home Assistive Devices/Equipment Home Assistive Devices/Equipment: None  Therapy Consults (therapy consults require a physician order) PT Evaluation Needed: No OT Evalulation Needed: No SLP Evaluation Needed: No Abuse/Neglect Assessment (Assessment to be complete while patient is alone) Physical Abuse: Denies Verbal Abuse: Denies Sexual Abuse: Denies Exploitation of patient/patient's resources: Denies Self-Neglect: Denies Values / Beliefs Cultural Requests During Hospitalization: None Spiritual Requests During Hospitalization: None Consults Spiritual Care Consult Needed: No Social Work Consult Needed: No Merchant navy officerAdvance Directives (For Healthcare) Does Patient Have a Medical Advance Directive?: No Would patient like information on creating a medical advance directive?: No - Patient declined          Disposition: Case was staffed with Shaune PollackLord DNP who recommended patient be observed and monitored for safety.       Disposition Initial Assessment Completed for this Encounter: Yes Disposition of Patient: (Observe and monitor) Patient refused recommended treatment: No Mode of transportation if patient is discharged/movement?: Loreli Slot(UNK)  On Site Evaluation by:   Reviewed with Physician:    Alfredia Fergusonavid L Soumya Colson 01/13/2018 9:58 AM

## 2018-01-13 NOTE — ED Provider Notes (Signed)
Emergency Department Provider Note   I have reviewed the triage vital signs and the nursing notes.   HISTORY  Chief Complaint Medical Clearance   HPI Barbara Benton is a 22 y.o. female with a history of psychosis not on medications who presents to the emergency department severely agitated, threatening multiple patients and staff.  She states  "people are trying to kill me",  "people put perfume in my food to try to kill me".  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   LEVEL V Caveat 2/2 psychiatric condition  Past Medical History:  Diagnosis Date  . ADHD (attention deficit hyperactivity disorder)   . Anxiety   . Bipolar disorder ([place])   . Depression   . ODD (oppositional defiant disorder)     Patient Active Problem List   Diagnosis Date Noted  . 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 No pertinent surgical history.  Current Outpatient Rx  . Order #: [id number] Class: Historical Med  . Order #: [id number] Class: Historical Med  . Order #: [id number] Class: Print    Allergies Patient has no known allergies.  History reviewed. No pertinent family history.  Social History Social History   Tobacco Use  . Smoking status: Current Every Day Smoker    Packs/day: 0.50    Types: Cigarettes  . Smokeless tobacco: Never Used  Substance Use Topics  . Alcohol use: Yes    Comment: occ  . Drug use: Yes    Types: Marijuana, MDMA (Ecstacy)    Review of Systems  LEVEL V Caveat  2/2 psychiatric condition ____________________________________________   PHYSICAL EXAM:  VITAL SIGNS: ED Triage Vitals  Enc Vitals Group     BP 01/13/18 0707 (!) 157/98     Pulse Rate 01/13/18 0707 91     Resp 01/13/18 0707 16     Temp 01/13/18 0707 97.6 F (36.4 C)     Temp Source 01/13/18 0707 Oral     SpO2 01/13/18 0707 100 %     Weight 01/13/18 0653 220 lb (99.8 kg)     Height 01/13/18 0653 5' 2"  (1.575 m)    Constitutional/Psych: Significant agitation. persectuory delusions. Coming out in hallway, threatening staff. Agitating other patients.  Eyes: Conjunctivae are normal. PERRL. EOMI. Head: Atraumatic. Nose: Not able to fully assess.  Mouth/Throat: Mucous membranes are moist.  Oropharynx non-erythematous. Neck: No stridor.  No meningeal signs.   Cardiovascular: Normal rate, regular rhythm. Good peripheral circulation. Grossly normal heart sounds.   Respiratory: Normal respiratory effort.  No retractions. Lungs CTAB. Gastrointestinal: Soft and nontender. No distention.  Musculoskeletal: Not able to fully assess. Neurologic:  Normal speech and language. No gross focal neurologic deficits are appreciated.  Skin:  Skin is warm, dry and intact. No rash noted.  ____________________________________________   LABS (all labs ordered are listed, but only abnormal results are displayed)  Labs Reviewed  CBC - Abnormal; Notable for the following components:  Result Value   RBC 5.21 (*)    HCT 46.7 (*)    All other components within normal limits  ACETAMINOPHEN LEVEL - Abnormal; Notable for the following components:   Acetaminophen (Tylenol), Serum <10 (*)    All other components within normal limits  COMPREHENSIVE METABOLIC PANEL  ETHANOL  SALICYLATE LEVEL  RAPID URINE DRUG SCREEN, HOSP PERFORMED  I-STAT BETA HCG BLOOD, ED ([facility], [facility], [facility] ONLY)   ____________________________________________  PROCEDURES  Procedure(s) performed:   Procedures  CRITICAL  CARE Performed by: Merrily Pew Total critical care time: 35 minutes Critical care time was exclusive of separately billable procedures and treating other patients. Critical care was necessary to treat or prevent imminent or life-threatening deterioration. Critical care was time spent personally by me on the following activities: development of treatment plan with patient and/or surrogate as well as nursing, discussions with consultants, evaluation of patient's response to treatment, examination of patient, obtaining history from patient or surrogate, ordering and performing treatments and interventions, ordering and review of laboratory studies, ordering and review of radiographic studies, pulse oximetry and re-evaluation of patient's condition.  ____________________________________________   INITIAL IMPRESSION / ASSESSMENT AND PLAN / ED COURSE  Acute psychosis. Tried to reason with her however she feels she is being persecuted and I am not doing what I should be doing to help her. Offered PO medications to help her calm down however she continues to be agitated, making statements as above. I feel she is acutely ill with her delusions and general psychosis and not on her medications. She likely needs those restarted and is not complying with instructions to do that. She will be involuntarily committed and sedated for her and our safety while we complete her medical screening exam and ensure that she has no acute organ damage or emergencies requiring medical attention prior to psychiatric care and hospitalization.     Pertinent labs & imaging results that were available during my care of the patient were reviewed by me and considered in my medical decision making (see chart for details).  ____________________________________________  FINAL CLINICAL IMPRESSION(S) / ED DIAGNOSES  Final diagnoses:  Psychosis, unspecified psychosis type ([place])    MEDICATIONS GIVEN DURING THIS  VISIT:  Medications  ziprasidone (GEODON) injection 20 mg (20 mg Intramuscular Given 01/13/18 0748)  sterile water (preservative free) injection (10 mLs  Given 01/13/18 0748)  LORazepam (ATIVAN) injection 2 mg (2 mg Intravenous Given 01/13/18 0751)    NEW OUTPATIENT MEDICATIONS STARTED DURING THIS VISIT:  New Prescriptions   No medications on file    Note:  This note was prepared with assistance of Dragon voice recognition software. Occasional wrong-word or sound-a-like substitutions may have occurred due to the inherent limitations of voice recognition software.   Sheketa Ende, Corene Cornea, MD 01/14/18 [phone]

## 2018-01-13 NOTE — Progress Notes (Signed)
Received Barbara Benton in her room asleep, she got up to use the bathroom and asked this writer to call the doctor because she is ready to go home. The IVC policy was explained to her, she accepted the explanation and returned to her room. She was given snacks and drifted back off to sleep. She denied all of the psychiatric symptoms included feeling suicidal. She was was OOB several times throughout the night to use the bathroom. She has remained calm and cooperative. She is hoping to go home today.

## 2018-01-13 NOTE — ED Triage Notes (Signed)
Patient states that she thinks somebody is poisoning her food with perfume and she ate it at 1 am. Patient is homeless.

## 2018-01-14 DIAGNOSIS — F23 Brief psychotic disorder: Secondary | ICD-10-CM

## 2018-01-14 LAB — RAPID URINE DRUG SCREEN, HOSP PERFORMED
Amphetamines: NOT DETECTED
BENZODIAZEPINES: POSITIVE — AB
Barbiturates: NOT DETECTED
Cocaine: NOT DETECTED
Opiates: NOT DETECTED
TETRAHYDROCANNABINOL: NOT DETECTED

## 2018-01-14 MED ORDER — ZIPRASIDONE HCL 20 MG PO CAPS: 20.0000 mg | ORAL_CAPSULE | Freq: Two times a day (BID) | ORAL | 0 refills | Status: DC

## 2018-01-14 NOTE — ED Notes (Signed)
Pt discharged home. Discharged instructions read to pt who verbalized understanding. All belongings returned to pt who signed for same. Denies SI/HI, is not delusional and not responding to internal stimuli. Escorted pt to the ED exit.    

## 2018-01-14 NOTE — BH Assessment (Signed)
[facility] Assessment Progress Note  Per Cristino MartesNadeem Aktar, MD, this pt does not require psychiatric hospitalization at this time.  Pt presents under IVC initiated by EDP Marily MemosJason Mesner, MD, which Dr Fredda HammedAktar has rescinded.  [facility] with [facility].  This has been included in pt's discharge instructions.  Pt's nurse, Diane, has been notified.  Doylene Canninghomas Sahiti Joswick, MA Triage Specialist [phone]

## 2018-01-14 NOTE — Discharge Instructions (Signed)
For your behavioral health needs, you are advised to continue treatment with [facility]: ° °     [facility] °     [address] °     [place], [place] [address]  °     [phone] °

## 2018-01-14 NOTE — ED Notes (Signed)
Requested information about Geodon from pharmacy for pt per pt request.
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
## 2018-01-14 NOTE — ED Notes (Signed)
Pt given information about Geodon sent by pharmacy.

## 2018-02-07 ENCOUNTER — Ambulatory Visit: Payer: Self-pay | Admitting: Family Medicine

## 2018-02-12 ENCOUNTER — Ambulatory Visit: Payer: Self-pay | Admitting: Family Medicine

## 2018-02-17 ENCOUNTER — Ambulatory Visit: Payer: Self-pay | Admitting: Family Medicine

## 2018-02-17 ENCOUNTER — Ambulatory Visit (INDEPENDENT_AMBULATORY_CARE_PROVIDER_SITE_OTHER): Payer: Medicaid Other | Admitting: Family Medicine

## 2018-02-17 ENCOUNTER — Encounter: Payer: Self-pay | Admitting: Family Medicine

## 2018-02-17 VITALS — BP 138/76 | HR 85 | Temp 97.7°F | Resp 16 | Ht 62.0 in | Wt 229.0 lb

## 2018-02-17 DIAGNOSIS — Z23 Encounter for immunization: Secondary | ICD-10-CM | POA: Diagnosis not present

## 2018-02-17 DIAGNOSIS — K219 Gastro-esophageal reflux disease without esophagitis: Secondary | ICD-10-CM | POA: Diagnosis not present

## 2018-02-17 DIAGNOSIS — R1084 Generalized abdominal pain: Secondary | ICD-10-CM

## 2018-02-17 MED ORDER — IBUPROFEN 800 MG PO TABS: 800.0000 mg | ORAL_TABLET | Freq: Three times a day (TID) | ORAL | 0 refills | Status: DC | PRN

## 2018-02-17 MED ORDER — OMEPRAZOLE 40 MG PO CPDR: 40.0000 mg | DELAYED_RELEASE_CAPSULE | Freq: Every day | ORAL | 3 refills | Status: DC

## 2018-02-17 NOTE — Progress Notes (Addendum)
[facility]  New Patient Encounter Provider: Mike Gip, FNP    TRR:[id number]  BFX:[id number]  DOB - 06/25/1995  SUBJECTIVE:   Barbara Benton, is a 23 y.o. female who presents to establish care with this clinic.   Current problems/concerns:   Patient states that she has been having stomach pain since the age of 67. States that she has been prescribed ibuprofen 800 mg prn for this with relief. She is requesting a referral to GI. Patient states that she has multiple types of pain that she rates as a 12 on a 0-10 scale. Patient states that she had stomach pain that has "lasted for 24 hours straight". Patient reports a history of taking omeprazole. Has not had medication in over a year. Patient states that food does not aggravate or alleviate "either way it just hurts".    Patient with a history of schizophrenia and was hospitalized. She was released on 01/14/2018. Patient states that she does not take her Geodon on a regular basis. States that she is weaning off and takes PRN.  Not currently followed by psych. [facility] states that she did not finish it due to her work schedule.   No Known Allergies Past Medical History:  Diagnosis Date  . ADHD (attention deficit hyperactivity disorder)   . Anxiety   . Bipolar disorder (HCC)   . Depression   . ODD (oppositional defiant disorder)    Current Outpatient Medications on File Prior to Visit  Medication Sig Dispense Refill  . ziprasidone (GEODON) 20 MG capsule Take 1 capsule (20 mg total) by mouth 2 (two) times daily with a meal. 14 capsule 0   No current facility-administered medications on file prior to visit.    History reviewed. No pertinent family history. Social History   Socioeconomic History  . Marital status: Single    Spouse name: Not on file  . Number of children: Not on file  . Years of education: Not on file  . Highest  education level: Not on file  Occupational History  . Not on file  Social Needs  . Financial resource strain: Not on file  . Food insecurity:    Worry: Not on file    Inability: Not on file  . Transportation needs:    Medical: Not on file    Non-medical: Not on file  Tobacco Use  . Smoking status: Current Every Day Smoker    Packs/day: 0.50    Types: Cigarettes  . Smokeless tobacco: Never Used  Substance and Sexual Activity  . Alcohol use: Yes    Comment: occ  . Drug use: Not Currently    Types: Marijuana, MDMA (Ecstacy)  . Sexual activity: Yes    Birth control/protection: Condom  Lifestyle  . Physical activity:    Days per week: Not on file    Minutes per session: Not on file  . Stress: Not on file  Relationships  . Social connections:    Talks on phone: Not on file    Gets together: Not on file    Attends religious service: Not on file    Active member of club or organization: Not on file    Attends meetings of clubs or organizations: Not on file    Relationship status: Not on file  . Intimate partner violence:    Fear of current or ex partner: Not on file    Emotionally abused: Not on file  Physically abused: Not on file    Forced sexual activity: Not on file  Other Topics Concern  . Not on file  Social History Narrative  . Not on file    Review of Systems  Constitutional: Negative.   HENT: Negative.   Eyes: Negative.   Respiratory: Negative.   Cardiovascular: Negative.   Gastrointestinal: Positive for abdominal pain and heartburn. Negative for blood in stool, constipation, diarrhea and melena.  Genitourinary: Negative.   Musculoskeletal: Negative.   Skin: Negative.   Neurological: Negative.   Psychiatric/Behavioral: Negative.      OBJECTIVE:    BP 138/76 (BP Location: Right Arm, Patient Position: Sitting, Cuff Size: Large)   Pulse 85   Temp 97.7 F (36.5 C) (Oral)   Resp 16   Ht 5\' 2"  (1.575 m)   Wt 229 lb (103.9 kg)   SpO2 100%   BMI  41.88 kg/m   Physical Exam  Constitutional: She is oriented to person, place, and time and well-developed, well-nourished, and in no distress. No distress.  HENT:  Head: Normocephalic and atraumatic.  Eyes: Pupils are equal, round, and reactive to light. Conjunctivae and EOM are normal.  Neck: Normal range of motion. Neck supple.  Cardiovascular: Normal rate, regular rhythm and intact distal pulses. Exam reveals no gallop and no friction rub.  No murmur heard. Pulmonary/Chest: Effort normal and breath sounds normal. No respiratory distress. She has no wheezes.  Abdominal: Soft. Bowel sounds are normal. There is abdominal tenderness in the periumbilical area.  Musculoskeletal: Normal range of motion.        General: No tenderness or edema.  Lymphadenopathy:    She has no cervical adenopathy.  Neurological: She is alert and oriented to person, place, and time. Gait normal.  Skin: Skin is warm and dry.  Psychiatric: Mood, memory, affect and judgment normal.  Nursing note and vitals reviewed.    ASSESSMENT/PLAN: 1. Generalized abdominal pain - Ambulatory referral to Gastroenterology - ibuprofen (ADVIL,MOTRIN) 800 MG tablet; Take 1 tablet (800 mg total) by mouth every 8 (eight) hours as needed for moderate pain. Take with food  Dispense: 60 tablet; Refill: 0  2. Gastroesophageal reflux disease, esophagitis presence not specified - Ambulatory referral to Gastroenterology - omeprazole (PRILOSEC) 40 MG capsule; Take 1 capsule (40 mg total) by mouth daily.  Dispense: 30 capsule; Refill: 3   Record review revealed a history of psychosis with a fixation related to GI symptoms. Patient is adamant about a referral for GI. Offered patient medications for GI for trial prior to an appointment. She will return for pap and well woman exam.     Return if symptoms worsen or fail to improve, for Pap.  The patient was given clear instructions to go to ER or return to medical center if symptoms don't  improve, worsen or new problems develop. The patient verbalized understanding. The patient was told to call to get lab results if they haven't heard anything in the next week.       Ms. Barbara Benton. Riley Lam, FNP-BC [facility] [address] [place], [place] [address] [phone]

## 2018-02-17 NOTE — Patient Instructions (Signed)

## 2018-02-19 ENCOUNTER — Encounter: Payer: Self-pay | Admitting: Gastroenterology

## 2018-02-19 ENCOUNTER — Ambulatory Visit: Payer: Self-pay | Admitting: Family Medicine

## 2018-03-03 ENCOUNTER — Ambulatory Visit (INDEPENDENT_AMBULATORY_CARE_PROVIDER_SITE_OTHER): Payer: Medicaid Other | Admitting: Family Medicine

## 2018-03-03 ENCOUNTER — Encounter: Payer: Self-pay | Admitting: Family Medicine

## 2018-03-03 VITALS — BP 136/90 | HR 70 | Temp 98.0°F | Ht 62.0 in | Wt 230.0 lb

## 2018-03-03 DIAGNOSIS — A749 Chlamydial infection, unspecified: Secondary | ICD-10-CM | POA: Diagnosis not present

## 2018-03-03 DIAGNOSIS — Z202 Contact with and (suspected) exposure to infections with a predominantly sexual mode of transmission: Secondary | ICD-10-CM

## 2018-03-03 DIAGNOSIS — N760 Acute vaginitis: Secondary | ICD-10-CM

## 2018-03-03 DIAGNOSIS — Z124 Encounter for screening for malignant neoplasm of cervix: Secondary | ICD-10-CM | POA: Diagnosis not present

## 2018-03-03 LAB — POCT URINALYSIS DIP (MANUAL ENTRY)
Bilirubin, UA: NEGATIVE
Blood, UA: NEGATIVE
Glucose, UA: NEGATIVE mg/dL
Ketones, POC UA: NEGATIVE mg/dL
Leukocytes, UA: NEGATIVE
Nitrite, UA: NEGATIVE
Protein Ur, POC: NEGATIVE mg/dL
Spec Grav, UA: 1.025 (ref 1.010–1.025)
Urobilinogen, UA: 0.2 E.U./dL
pH, UA: 5.5 (ref 5.0–8.0)

## 2018-03-03 MED ORDER — METRONIDAZOLE 500 MG PO TABS: 500.0000 mg | ORAL_TABLET | Freq: Two times a day (BID) | ORAL | 0 refills | Status: AC

## 2018-03-03 NOTE — Patient Instructions (Signed)

## 2018-03-04 LAB — HEPB+HEPC+HIV PANEL
HIV Screen 4th Generation wRfx: NONREACTIVE
Hep B C IgM: NEGATIVE
Hep B Core Total Ab: NEGATIVE
Hep B E Ab: NEGATIVE
Hep B E Ag: NEGATIVE
Hep B Surface Ab, Qual: NONREACTIVE
Hep C Virus Ab: <0.1 s/co ratio (ref 0.0–0.9)
Hepatitis B Surface Ag: NEGATIVE

## 2018-03-04 LAB — RPR: RPR Ser Ql: NONREACTIVE

## 2018-03-05 ENCOUNTER — Telehealth: Payer: Self-pay

## 2018-03-05 LAB — CT/NG RNA, TMA RECTAL
Chlamydia trachomatis, NAA: POSITIVE — AB
Neisseria gonorrhoeae, NAA: NEGATIVE

## 2018-03-05 LAB — NUSWAB VAGINITIS PLUS (VG+)
Candida albicans, NAA: NEGATIVE
Candida glabrata, NAA: NEGATIVE
Chlamydia trachomatis, NAA: POSITIVE — AB
Neisseria gonorrhoeae, NAA: NEGATIVE
Trich vag by NAA: NEGATIVE

## 2018-03-05 NOTE — Telephone Encounter (Signed)
Called, no answer. Left a message for patient to call back. Thanks!  

## 2018-03-06 ENCOUNTER — Telehealth: Payer: Self-pay

## 2018-03-06 MED ORDER — AZITHROMYCIN 500 MG PO TABS: 1000.0000 mg | ORAL_TABLET | Freq: Every day | ORAL | 0 refills | Status: AC

## 2018-03-06 NOTE — Telephone Encounter (Signed)
CDC report faxed to Health department for positive chlamydia.

## 2018-03-06 NOTE — Progress Notes (Signed)
Called patient and informed her that she is positive for chlamydia. Sent the medications to the pharmacy. Patient advised to have partner treated and to refrain from sex for the next 2 weeks.  The patient verbalized understanding and agreed with plan of care.

## 2018-03-07 ENCOUNTER — Ambulatory Visit: Payer: Self-pay | Admitting: Gastroenterology

## 2018-03-07 ENCOUNTER — Telehealth: Payer: Self-pay

## 2018-03-07 LAB — PAP IG, CT-NG, RFX HPV ASCU
Chlamydia, Nuc. Acid Amp: POSITIVE — AB
Gonococcus by Nucleic Acid Amp: NEGATIVE

## 2018-03-07 NOTE — Telephone Encounter (Signed)
-----   Message from Mike Gip, FNP sent at 03/07/2018  2:03 PM EST ----- Please let patient know that her pap smear was negative. No HPV detected.

## 2018-03-07 NOTE — Telephone Encounter (Signed)
Called and spoke with patient, advised that pap smear was negative and no hpv was detected. Patient verbalized understanding. Thanks!

## 2018-03-21 ENCOUNTER — Telehealth: Payer: Self-pay

## 2018-03-21 ENCOUNTER — Telehealth: Payer: Self-pay | Admitting: Family Medicine

## 2018-03-21 NOTE — Telephone Encounter (Signed)
Patient is requesting refill of 800mg  Advil to pharmacy until she can see provider 3/17. Is this okay?

## 2018-03-24 ENCOUNTER — Other Ambulatory Visit: Payer: Self-pay

## 2018-03-24 ENCOUNTER — Inpatient Hospital Stay (HOSPITAL_COMMUNITY)
Admission: AD | Admit: 2018-03-24 | Discharge: 2018-03-24 | Disposition: A | Discharge status: Home / Self Care | Payer: Medicaid Other | Attending: Obstetrics and Gynecology | Admitting: Obstetrics and Gynecology

## 2018-03-24 DIAGNOSIS — R1110 Vomiting, unspecified: Secondary | ICD-10-CM | POA: Insufficient documentation

## 2018-03-24 DIAGNOSIS — Z3202 Encounter for pregnancy test, result negative: Secondary | ICD-10-CM | POA: Diagnosis not present

## 2018-03-24 LAB — POCT PREGNANCY, URINE
PREG TEST UR: NEGATIVE
Preg Test, Ur: NEGATIVE

## 2018-03-24 NOTE — MAU Provider Note (Signed)
First Provider Initiated Contact with Patient 03/24/18 1521      S Ms. Barbara Benton is a 23 y.o. No obstetric history on file. non-pregnant female who presents to MAU today with complaint of possible pregnancy with vomiting and HA. Pt denies any nausea or vomiting while in MAU.  O BP (!) 153/86 (BP Location: Right Arm)   Pulse 74   Temp 98.4 F (36.9 C) (Oral)   Resp 17   Ht 5\' 2"  (1.575 m)   Wt 100.6 kg   LMP 02/16/2018 (Approximate)   SpO2 100%   BMI 40.55 kg/m  Physical Exam  Constitutional: She is oriented to person, place, and time. She appears well-developed and well-nourished. No distress.  HENT:  Head: Normocephalic.  Neck: Normal range of motion.  Respiratory: Effort normal.  Neurological: She is alert and oriented to person, place, and time.  Skin: She is not diaphoretic.  Psychiatric: She has a normal mood and affect. Her behavior is normal.    A Non pregnant female Medical screening exam complete 1. Vomiting, intractability of vomiting not specified, presence of nausea not specified, unspecified vomiting type   2. Negative pregnancy test   -no N/V at time of MAU visit  P Discharge from MAU in stable condition Patient given the option of transfer to [facility] for further evaluation or seek care in outpatient facility of choice List of options for follow-up given  Warning signs for worsening condition that would warrant emergency follow-up discussed Patient may return to MAU as needed for pregnancy related complaints  Ilya Ess, Odie Sera, NP 03/24/2018 3:29 PM

## 2018-03-24 NOTE — MAU Note (Signed)
She has GI appt on 3/17.  Getting headaches, dizziness, nausea, smells make her feel  Weak, has thrown up a couple times(none today). Is trying to request an [place]. Pain in upper abd. Home test was neg- wk ago. Is here for confirmation.

## 2018-03-27 ENCOUNTER — Telehealth: Payer: Self-pay

## 2018-03-27 DIAGNOSIS — R1084 Generalized abdominal pain: Secondary | ICD-10-CM

## 2018-03-27 MED ORDER — IBUPROFEN 800 MG PO TABS: 800.0000 mg | ORAL_TABLET | Freq: Three times a day (TID) | ORAL | 0 refills | Status: DC | PRN

## 2018-03-27 NOTE — Telephone Encounter (Signed)
refilled 

## 2018-04-01 ENCOUNTER — Ambulatory Visit (INDEPENDENT_AMBULATORY_CARE_PROVIDER_SITE_OTHER): Payer: Medicaid Other | Admitting: Gastroenterology

## 2018-04-01 ENCOUNTER — Other Ambulatory Visit (INDEPENDENT_AMBULATORY_CARE_PROVIDER_SITE_OTHER): Payer: Medicaid Other

## 2018-04-01 ENCOUNTER — Other Ambulatory Visit: Payer: Self-pay

## 2018-04-01 ENCOUNTER — Encounter: Payer: Self-pay | Admitting: Gastroenterology

## 2018-04-01 VITALS — BP 120/82 | HR 74 | Temp 98.7°F | Ht 60.5 in | Wt 223.5 lb

## 2018-04-01 DIAGNOSIS — R112 Nausea with vomiting, unspecified: Secondary | ICD-10-CM

## 2018-04-01 DIAGNOSIS — R109 Unspecified abdominal pain: Secondary | ICD-10-CM | POA: Diagnosis not present

## 2018-04-01 LAB — COMPREHENSIVE METABOLIC PANEL
ALT: 17 U/L (ref 0–35)
AST: 16 U/L (ref 0–37)
Albumin: 4.2 g/dL (ref 3.5–5.2)
Alkaline Phosphatase: 106 U/L (ref 39–117)
BUN: 13 mg/dL (ref 6–23)
CO2: 27 mEq/L (ref 19–32)
CREATININE: 0.76 mg/dL (ref 0.40–1.20)
Calcium: 9.4 mg/dL (ref 8.4–10.5)
Chloride: 106 mEq/L (ref 96–112)
GFR: 114.68 mL/min (ref >60.00)
Glucose, Bld: 93 mg/dL (ref 70–99)
Potassium: 4 mEq/L (ref 3.5–5.1)
Sodium: 141 mEq/L (ref 135–145)
TOTAL PROTEIN: 7.4 g/dL (ref 6.0–8.3)
Total Bilirubin: 0.3 mg/dL (ref 0.2–1.2)

## 2018-04-01 LAB — CBC WITH DIFFERENTIAL/PLATELET
BASOS ABS: 0.1 10*3/uL (ref 0.0–0.1)
Basophils Relative: 1 % (ref 0.0–3.0)
Eosinophils Absolute: 0.1 10*3/uL (ref 0.0–0.7)
Eosinophils Relative: 1.1 % (ref 0.0–5.0)
HCT: 41.2 % (ref 36.0–46.0)
Hemoglobin: 13.5 g/dL (ref 12.0–15.0)
LYMPHS ABS: 1.9 10*3/uL (ref 0.7–4.0)
Lymphocytes Relative: 26.9 % (ref 12.0–46.0)
MCHC: 32.8 g/dL (ref 30.0–36.0)
MCV: 88.2 fl (ref 78.0–100.0)
Monocytes Absolute: 0.6 10*3/uL (ref 0.1–1.0)
Monocytes Relative: 9 % (ref 3.0–12.0)
Neutro Abs: 4.4 10*3/uL (ref 1.4–7.7)
Neutrophils Relative %: 62 % (ref 43.0–77.0)
PLATELETS: 268 10*3/uL (ref 150.0–400.0)
RBC: 4.67 Mil/uL (ref 3.87–5.11)
RDW: 15.2 % (ref 11.5–15.5)
WBC: 7.1 10*3/uL (ref 4.0–10.5)

## 2018-04-01 NOTE — Progress Notes (Signed)
HPI: This is a 23 year old woman who was referred to me by Mike Gip, FNP  to evaluate chronic abdominal pains.    Chief complaint is abdominal pains for 7 years  She has significant psychiatric issues.  Her history giving is scattered.  Since she was 23 years old, upper abd pains, gas like.  Was" really really really bad".  The pains are intermittent, about once per week. Feels like excruciating pain.  It will last 12 hours.  She will wait, consider going to the ER.  She tries moving position.  She has nausea and vomiting during the pains.  While she was in the office she started applying excessive amounts of lotion to her face.  It was quite unusual  She has no issues with her bowels.  Once daily BMs.  Stable weight.  Never had any testing for the pains.  She takes ibuprofen several times per week;  Usually 3200mg  when the abd pain starts.   Old Data Reviewed: Reviewing through epic and care everywhere she has had at least 15 or 20 emergency room visits throughout 2019 and 2020 for a variety of complaints including vaginal discharge, suicidal ideation, anxiety, generalized abdominal pain, psychosis.   Lab testing February and March 2020.  Urine pregnancy test was negative, HIV was negative, hepatitis C antibody was negative, hepatitis B core antibody IgM was negative, hepatitis B surface antibody was negative,  December 2019 labs; complete metabolic profile was normal.  CBC shows normal hemoglobin.    Review of systems: Pertinent positive and negative review of systems were noted in the above HPI section. All other review negative.   Past Medical History:  Diagnosis Date  . ADHD (attention deficit hyperactivity disorder)   . Anxiety   . Bipolar disorder (HCC)   . Depression   . GERD (gastroesophageal reflux disease)   . Hypertension   . ODD (oppositional defiant disorder)     Past Surgical History:  Procedure Laterality Date  . NO PAST SURGERIES      Current  Outpatient Medications  Medication Sig Dispense Refill  . ibuprofen (ADVIL,MOTRIN) 800 MG tablet Take 1 tablet (800 mg total) by mouth every 8 (eight) hours as needed for moderate pain. Take with food 60 tablet 0  . omeprazole (PRILOSEC) 40 MG capsule Take 1 capsule (40 mg total) by mouth daily. 30 capsule 3  . ziprasidone (GEODON) 20 MG capsule Take 1 capsule (20 mg total) by mouth 2 (two) times daily with a meal. 14 capsule 0   No current facility-administered medications for this visit.     Allergies as of 04/01/2018  . (No Known Allergies)    Family History  Problem Relation Age of Onset  . Diabetes Maternal Grandmother   . Cancer Paternal Grandfather        type unknown    Social History   Socioeconomic History  . Marital status: Single    Spouse name: Not on file  . Number of children: 0  . Years of education: Not on file  . Highest education level: Not on file  Occupational History  . Occupation: Advertising account planner  Social Needs  . Financial resource strain: Not on file  . Food insecurity:    Worry: Not on file    Inability: Not on file  . Transportation needs:    Medical: Not on file    Non-medical: Not on file  Tobacco Use  . Smoking status: Current Every Day Smoker    Packs/day: 0.50  Types: Cigarettes  . Smokeless tobacco: Never Used  Substance and Sexual Activity  . Alcohol use: Yes    Comment: occ  . Drug use: Not Currently    Types: Marijuana, MDMA (Ecstacy)  . Sexual activity: Yes    Birth control/protection: Condom  Lifestyle  . Physical activity:    Days per week: Not on file    Minutes per session: Not on file  . Stress: Not on file  Relationships  . Social connections:    Talks on phone: Not on file    Gets together: Not on file    Attends religious service: Not on file    Active member of club or organization: Not on file    Attends meetings of clubs or organizations: Not on file    Relationship status: Not on file  . Intimate partner  violence:    Fear of current or ex partner: Not on file    Emotionally abused: Not on file    Physically abused: Not on file    Forced sexual activity: Not on file  Other Topics Concern  . Not on file  Social History Narrative  . Not on file     Physical Exam: BP 120/82 (BP Location: Left Arm, Patient Position: Sitting, Cuff Size: Normal)   Pulse 74   Temp 98.7 F (37.1 C)   Ht 5' 0.5" (1.537 m) Comment: height measured without shoes  Wt 223 lb 8 oz (101.4 kg)   BMI 42.93 kg/m  Constitutional: generally well-appearing Psychiatric: alert and oriented x3 Eyes: extraocular movements intact Mouth: oral pharynx moist, no lesions Neck: supple no lymphadenopathy Cardiovascular: heart regular rate and rhythm Lungs: clear to auscultation bilaterally Abdomen: soft, nontender, nondistended, no obvious ascites, no peritoneal signs, normal bowel sounds Extremities: no lower extremity edema bilaterally Skin: no lesions on visible extremities   Assessment and plan: 23 y.o. female with chronic abdominal pains, bipolar  Her history giving was scattered.  I think her psychiatric illnesses limit her and make reliable history difficult.  During the examination in the office she took out a bottle of lotion and started smearing excessive amounts on her face time it was for dry skin.  It was quite unusual.  I suspect her pains are functional.  Certainly she takes a lot of ibuprofen.  I recommended that she try cutting back on that as it can be causing some of her pains.  I recommended we start her work-up with imaging and blood work.  CT scan abdomen pelvis with IV and oral contrast, CBC and complete metabolic profile.    Please see the "Patient Instructions" section for addition details about the plan.   Rob Bunting, MD [facility] 04/01/2018, 1:49 PM  Cc: Mike Gip, FNP

## 2018-04-01 NOTE — Patient Instructions (Addendum)
You will have labs checked today in the basement lab.  Please head down after you check out with the front desk  (cbc, cmet). You will be set up for a CT scan of abdomen and pelvis with IV and oral contrast for abdominal pain, nausea, vomiting.  You have been scheduled for a CT scan of the abdomen and pelvis at [facility] ([address]---this is in the same building as Press [employer]).   You are scheduled on 04/23/18 at 2pm. You should arrive 15 minutes prior to your appointment time for registration. Please follow the written instructions below on the day of your exam:  WARNING: IF YOU ARE ALLERGIC TO IODINE/X-RAY DYE, PLEASE NOTIFY RADIOLOGY IMMEDIATELY AT [phone]! YOU WILL BE GIVEN A 13 HOUR PREMEDICATION PREP.  1) Do not eat or drink anything after 10am (4 hours prior to your test) 2) You have been given 2 bottles of oral contrast to drink. The solution may taste better if refrigerated, but do NOT add ice or any other liquid to this solution. Shake well before drinking.    Drink 1 bottle of contrast @ 12pm (2 hours prior to your exam)  Drink 1 bottle of contrast @ 1pm (1 hour prior to your exam)  You may take any medications as prescribed with a small amount of water, if necessary. If you take any of the following medications: METFORMIN, GLUCOPHAGE, GLUCOVANCE, AVANDAMET, RIOMET, FORTAMET, [place] MET, JANUMET, GLUMETZA or METAGLIP, you MAY be asked to HOLD this medication 48 hours AFTER the exam.  The purpose of you drinking the oral contrast is to aid in the visualization of your intestinal tract. The contrast solution may cause some diarrhea. Depending on your individual set of symptoms, you may also receive an intravenous injection of x-ray contrast/dye. Plan on being at [facility] for 30 minutes or longer, depending on the type of exam you are having performed.  This test typically takes 30-45 minutes to complete.  If you have any questions regarding  your exam or if you need to reschedule, you may call the CT department at [phone] between the hours of 8:00 am and 5:00 pm, Monday-Friday.  Thank you for entrusting me with your care and choosing [facility].  Dr Ardis Hughs   ________________________________________________________________________

## 2018-04-02 ENCOUNTER — Encounter (HOSPITAL_COMMUNITY): Payer: Self-pay | Admitting: Emergency Medicine

## 2018-04-02 ENCOUNTER — Other Ambulatory Visit: Payer: Self-pay

## 2018-04-02 ENCOUNTER — Emergency Department (HOSPITAL_COMMUNITY)
Admission: EM | Admit: 2018-04-02 | Discharge: 2018-04-02 | Disposition: A | Discharge status: Home / Self Care | Payer: Medicaid Other | Attending: Emergency Medicine | Admitting: Emergency Medicine

## 2018-04-02 ENCOUNTER — Telehealth: Payer: Self-pay

## 2018-04-02 DIAGNOSIS — R4182 Altered mental status, unspecified: Secondary | ICD-10-CM | POA: Diagnosis present

## 2018-04-02 DIAGNOSIS — F17210 Nicotine dependence, cigarettes, uncomplicated: Secondary | ICD-10-CM | POA: Insufficient documentation

## 2018-04-02 DIAGNOSIS — F411 Generalized anxiety disorder: Secondary | ICD-10-CM | POA: Insufficient documentation

## 2018-04-02 DIAGNOSIS — Z9114 Patient's other noncompliance with medication regimen: Secondary | ICD-10-CM | POA: Diagnosis not present

## 2018-04-02 DIAGNOSIS — Z79899 Other long term (current) drug therapy: Secondary | ICD-10-CM | POA: Diagnosis not present

## 2018-04-02 DIAGNOSIS — F319 Bipolar disorder, unspecified: Secondary | ICD-10-CM | POA: Diagnosis not present

## 2018-04-02 DIAGNOSIS — F603 Borderline personality disorder: Secondary | ICD-10-CM | POA: Diagnosis not present

## 2018-04-02 DIAGNOSIS — Z046 Encounter for general psychiatric examination, requested by authority: Secondary | ICD-10-CM | POA: Insufficient documentation

## 2018-04-02 DIAGNOSIS — I10 Essential (primary) hypertension: Secondary | ICD-10-CM | POA: Insufficient documentation

## 2018-04-02 LAB — COMPREHENSIVE METABOLIC PANEL
ALT: 20 U/L (ref 0–44)
AST: 24 U/L (ref 15–41)
Albumin: 3.7 g/dL (ref 3.5–5.0)
Alkaline Phosphatase: 95 U/L (ref 38–126)
Anion gap: 7 (ref 5–15)
BUN: 14 mg/dL (ref 6–20)
CO2: 25 mmol/L (ref 22–32)
Calcium: 9.1 mg/dL (ref 8.9–10.3)
Chloride: 109 mmol/L (ref 98–111)
Creatinine, Ser: 0.74 mg/dL (ref 0.44–1.00)
GFR calc Af Amer: >60 mL/min (ref >60)
GFR calc non Af Amer: >60 mL/min (ref >60)
Glucose, Bld: 88 mg/dL (ref 70–99)
POTASSIUM: 3.7 mmol/L (ref 3.5–5.1)
Sodium: 141 mmol/L (ref 135–145)
Total Bilirubin: 0.6 mg/dL (ref 0.3–1.2)
Total Protein: 7.3 g/dL (ref 6.5–8.1)

## 2018-04-02 LAB — CBC WITH DIFFERENTIAL/PLATELET
Abs Immature Granulocytes: 0.01 10*3/uL (ref 0.00–0.07)
Basophils Absolute: 0 10*3/uL (ref 0.0–0.1)
Basophils Relative: 0 %
Eosinophils Absolute: 0.1 10*3/uL (ref 0.0–0.5)
Eosinophils Relative: 2 %
HEMATOCRIT: 41.2 % (ref 36.0–46.0)
Hemoglobin: 12.8 g/dL (ref 12.0–15.0)
Immature Granulocytes: 0 %
Lymphocytes Relative: 31 %
Lymphs Abs: 2 10*3/uL (ref 0.7–4.0)
MCH: 28.8 pg (ref 26.0–34.0)
MCHC: 31.1 g/dL (ref 30.0–36.0)
MCV: 92.6 fL (ref 80.0–100.0)
Monocytes Absolute: 0.7 10*3/uL (ref 0.1–1.0)
Monocytes Relative: 10 %
NEUTROS ABS: 3.7 10*3/uL (ref 1.7–7.7)
Neutrophils Relative %: 57 %
Platelets: 260 10*3/uL (ref 150–400)
RBC: 4.45 MIL/uL (ref 3.87–5.11)
RDW: 14.2 % (ref 11.5–15.5)
WBC: 6.5 10*3/uL (ref 4.0–10.5)
nRBC: 0 % (ref 0.0–0.2)

## 2018-04-02 LAB — SALICYLATE LEVEL: Salicylate Lvl: <7 mg/dL (ref 2.8–30.0)

## 2018-04-02 LAB — RAPID URINE DRUG SCREEN, HOSP PERFORMED
Amphetamines: NOT DETECTED
BENZODIAZEPINES: NOT DETECTED
Barbiturates: NOT DETECTED
Cocaine: NOT DETECTED
Opiates: NOT DETECTED
Tetrahydrocannabinol: NOT DETECTED

## 2018-04-02 LAB — ETHANOL: Alcohol, Ethyl (B): <10 mg/dL (ref <10)

## 2018-04-02 LAB — ACETAMINOPHEN LEVEL: Acetaminophen (Tylenol), Serum: <10 ug/mL — ABNORMAL LOW (ref 10–30)

## 2018-04-02 NOTE — Progress Notes (Signed)
Received Barbara Benton this PM from the ED, alert and oriented. She stated she is here for medication to treat her anxiety, depression and BPD. She was given Geodon by an outside facility, but does not like the way it makes her feel. She denied hearing voices, but her behavior indicated responding to internal stimuli. She is thought blocking, pausing in mid sentence with an intense listening behavior. Her explanation is very disorganized explaining why she is here tonight at the hospital and the events that led up to coming to the hospital. She was given a snack x2. Later she was informed of her impending discharge to night.

## 2018-04-02 NOTE — ED Triage Notes (Addendum)
Per EMS, patient from the bus stop, reports feeling "off." Reports taking meds as prescribed. Denies SI/HI. Hx borderline personality disorder. Denies A/V/H.  Patient reports taking Geodon as prescribed. States it makes her sleepy. States she called psychiatrist to make an appointment but 4/7 is the earliest they could see her.

## 2018-04-02 NOTE — ED Notes (Signed)
Bed: IA16 Expected date:  Expected time:  Means of arrival:  Comments: T3

## 2018-04-02 NOTE — ED Notes (Signed)
Bed: [facility]PT3 Expected date:  Expected time:  Means of arrival:  Comments: 

## 2018-04-02 NOTE — Telephone Encounter (Signed)
CT is requesting to reschedule the patients upcoming CT on 04/23/18 until after 04/25/18.  Please review for urgency.

## 2018-04-02 NOTE — ED Notes (Signed)
Patient changed into paper scrubs and wanded by security. 

## 2018-04-02 NOTE — ED Provider Notes (Signed)
[facility]-EMERGENCY DEPT Provider Note   CSN: [id number] Arrival date & time: 04/02/18  1828    History   Chief Complaint Chief Complaint  Patient presents with  . Psychiatric Evaluation    HPI Barbara Benton is a 23 y.o. female with a PMHx Anxiety, Depression, Bipolar disorder, ODD, and ADHD.      Pt presents to the ED for psych eval. Per triage note EMS picked pt up from bus stop after she reported feeling "off." She states that she has been taking Geodon for her bipolar disorder but that it has been making her feel zoned out so she stopped taking it. Pt goes from stating she stopped taking it a month ago to saying she took a month's worth of her prescription as needed and then recently got another bottle filled and decided to stop taking it "Recently." Unable to illicit when pt stopped taking it as she quickly changes her story. Pt zoning out during conversation and having difficulty answering questions appropriately. No SI or HI at this time. Denies visual or auditory hallucinations. No other complaints. Reports she came here to get her medicine fixed because the Geodon is making her too sleepy and she wants something else. States she is not taking anything else for her psychiatric illnesses at this time.      Past Medical History:  Diagnosis Date  . ADHD (attention deficit hyperactivity disorder)   . Anxiety   . Bipolar disorder (HCC)   . Depression   . GERD (gastroesophageal reflux disease)   . Hypertension   . ODD (oppositional defiant disorder)     Patient Active Problem List   Diagnosis Date Noted  . Schizophrenia, acute (HCC) 01/14/2018  . 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 NO PAST SURGERIES       OB History   No obstetric history on file.      Home Medications    Prior to Admission medications   Medication Sig Start Date End Date  Taking? Authorizing Provider  ziprasidone (GEODON) 20 MG capsule Take 1 capsule (20 mg total) by mouth 2 (two) times daily with a meal. 01/14/18  Yes Starkes-Perry, Juel Burrow, FNP  ibuprofen (ADVIL,MOTRIN) 800 MG tablet Take 1 tablet (800 mg total) by mouth every 8 (eight) hours as needed for moderate pain. Take with food 03/27/18   Mike Gip, FNP  omeprazole (PRILOSEC) 40 MG capsule Take 1 capsule (40 mg total) by mouth daily. Patient not taking: Reported on 04/02/2018 02/17/18   Mike Gip, FNP    Family History Family History  Problem Relation Age of Onset  . Diabetes Maternal Grandmother   . Cancer Paternal Grandfather        type unknown    Social History Social History   Tobacco Use  . Smoking status: Current Every Day Smoker    Packs/day: 0.50    Types: Cigarettes  . Smokeless tobacco: Never Used  Substance Use Topics  . Alcohol use: Yes    Comment: occ  . Drug use: Not Currently    Types: Marijuana, MDMA (Ecstacy)     Allergies   Patient has no known allergies.   Review of Systems Review of Systems  Constitutional: Negative for fever.  HENT: Negative for rhinorrhea.   Eyes: Negative for pain.  Respiratory: Negative for cough.   Cardiovascular: Negative for chest pain.  Gastrointestinal: Negative for abdominal pain.  Genitourinary:  Negative for dysuria.  Musculoskeletal: Negative for back pain.  Skin: Negative for rash.  Neurological: Negative for headaches.  Psychiatric/Behavioral: Negative for hallucinations and suicidal ideas.     Physical Exam Updated Vital Signs BP (!) 156/109 (BP Location: Left Arm)   Pulse 85   Temp 98.3 F (36.8 C) (Oral)   Resp (!) 21   Wt 99.8 kg   LMP 03/04/2018 (Approximate)   SpO2 99%   BMI 42.26 kg/m   Physical Exam Vitals signs and nursing note reviewed.  Constitutional:      Appearance: She is not ill-appearing.  HENT:     Head: Normocephalic and atraumatic.  Eyes:     Conjunctiva/sclera: Conjunctivae  normal.  Neck:     Musculoskeletal: Neck supple.  Cardiovascular:     Rate and Rhythm: Normal rate and regular rhythm.  Pulmonary:     Effort: Pulmonary effort is normal.     Breath sounds: Normal breath sounds.  Abdominal:     Palpations: Abdomen is soft.     Tenderness: There is no abdominal tenderness.  Skin:    General: Skin is warm and dry.  Neurological:     Mental Status: She is alert.  Psychiatric:        Attention and Perception: She is inattentive.        Mood and Affect: Mood and affect normal.        Speech: Speech is delayed.        Behavior: Behavior is slowed.        Thought Content: Thought content does not include homicidal or suicidal ideation. Thought content does not include homicidal or suicidal plan.      ED Treatments / Results  Labs (all labs ordered are listed, but only abnormal results are displayed) Labs Reviewed  ACETAMINOPHEN LEVEL - Abnormal; Notable for the following components:      Result Value   Acetaminophen (Tylenol), Serum <10 (*)    All other components within normal limits  COMPREHENSIVE METABOLIC PANEL  CBC WITH DIFFERENTIAL/PLATELET  ETHANOL  RAPID URINE DRUG SCREEN, HOSP PERFORMED  SALICYLATE LEVEL    EKG None  Radiology No results found.  Procedures Procedures (including critical care time)  Medications Ordered in ED Medications - No data to display   Initial Impression / Assessment and Plan / ED Course  I have reviewed the triage vital signs and the nursing notes.  Pertinent labs & imaging results that were available during my care of the patient were reviewed by me and considered in my medical decision making (see chart for details).    Pt presents for psych eval. No SI or HI or A/V hallucinations. Requesting medications to be fixed. Able to answer questions appropriately although at times stares off into space; seems to be responding to some internal stimuli. Will order medical screening labs prior to psych  seeing patient. Vital signs stable at this time.   9:28 PM Spoke with [facility] counselor who reports pt does not meet inpatient criteria. They suggested pt keep appointment with psych April 3rd for med reconciliation. I agree with plan. Awaiting lab results prior to discharge.   10:09 PM All labs unremarkable; medically cleared. Stable for discharge at this time. Pt to follow with [facility] as previously scheduled. She is in agreement with plan.        Final Clinical Impressions(s) / ED Diagnoses   Final diagnoses:  Generalized anxiety disorder  Borderline personality disorder [facility])    ED Discharge Orders  None       Tanda Rockers, PA-C 04/02/18 2324    Melene Plan, DO 04/07/18 1506
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
## 2018-04-02 NOTE — ED Notes (Signed)
Discharged without incident and escorted to the front door with a bus pass. She was given her personal belongings, changed her clothes and her AVS with understanding of her follow up care.

## 2018-04-02 NOTE — Discharge Instructions (Signed)
Please keep appointment with [facility] scheduled for April 3rd to discuss medications that you are on.  Return to the ED if you are experiencing suicidal or homicidal ideation or visual/auditory hallucinations.

## 2018-04-03 NOTE — Telephone Encounter (Signed)
Ok to wait until.  Please explain to the patient that this delay is related to COVID-19 preparedness.

## 2018-04-04 ENCOUNTER — Ambulatory Visit: Payer: Self-pay | Admitting: Family Medicine

## 2018-04-10 ENCOUNTER — Telehealth: Payer: Self-pay | Admitting: Gastroenterology

## 2018-04-10 NOTE — Telephone Encounter (Signed)
Stacy from [place] [facility] called to advised that the patient was very upset when she was advised per doctor to cancel CT scan. Pt told her that she was having it anyway because it was urgent. Stacy advised the pt to speak with the doctor for further assistance.

## 2018-04-10 NOTE — Telephone Encounter (Signed)
Noted  

## 2018-04-14 NOTE — Telephone Encounter (Signed)
Hi Linda, pls disregard this message, pt called today and I explained to her the situation which she understood and agreed on waiting to r/s CT scan.

## 2018-04-16 ENCOUNTER — Encounter (HOSPITAL_COMMUNITY): Payer: Self-pay | Admitting: Emergency Medicine

## 2018-04-16 ENCOUNTER — Emergency Department (HOSPITAL_COMMUNITY)
Admission: EM | Admit: 2018-04-16 | Discharge: 2018-04-16 | Disposition: A | Discharge status: Home / Self Care | Payer: Medicaid Other | Source: Home / Self Care | Attending: Emergency Medicine | Admitting: Emergency Medicine

## 2018-04-16 ENCOUNTER — Other Ambulatory Visit: Payer: Self-pay

## 2018-04-16 ENCOUNTER — Emergency Department (HOSPITAL_COMMUNITY)
Admission: EM | Admit: 2018-04-16 | Discharge: 2018-04-16 | Disposition: A | Discharge status: Home / Self Care | Payer: Medicaid Other | Attending: Emergency Medicine | Admitting: Emergency Medicine

## 2018-04-16 DIAGNOSIS — I10 Essential (primary) hypertension: Secondary | ICD-10-CM | POA: Insufficient documentation

## 2018-04-16 DIAGNOSIS — F4325 Adjustment disorder with mixed disturbance of emotions and conduct: Secondary | ICD-10-CM | POA: Diagnosis present

## 2018-04-16 DIAGNOSIS — Z046 Encounter for general psychiatric examination, requested by authority: Secondary | ICD-10-CM

## 2018-04-16 DIAGNOSIS — R51 Headache: Secondary | ICD-10-CM | POA: Insufficient documentation

## 2018-04-16 DIAGNOSIS — Z79899 Other long term (current) drug therapy: Secondary | ICD-10-CM | POA: Diagnosis not present

## 2018-04-16 DIAGNOSIS — F17210 Nicotine dependence, cigarettes, uncomplicated: Secondary | ICD-10-CM | POA: Insufficient documentation

## 2018-04-16 DIAGNOSIS — F3110 Bipolar disorder, current episode manic without psychotic features, unspecified: Secondary | ICD-10-CM

## 2018-04-16 DIAGNOSIS — Z590 Homelessness: Secondary | ICD-10-CM | POA: Insufficient documentation

## 2018-04-16 DIAGNOSIS — R109 Unspecified abdominal pain: Secondary | ICD-10-CM | POA: Insufficient documentation

## 2018-04-16 DIAGNOSIS — F209 Schizophrenia, unspecified: Secondary | ICD-10-CM | POA: Insufficient documentation

## 2018-04-16 DIAGNOSIS — R6883 Chills (without fever): Secondary | ICD-10-CM | POA: Insufficient documentation

## 2018-04-16 DIAGNOSIS — X398XXA Other exposure to forces of nature, initial encounter: Secondary | ICD-10-CM | POA: Insufficient documentation

## 2018-04-16 LAB — CBC
HCT: 47.1 % — ABNORMAL HIGH (ref 36.0–46.0)
Hemoglobin: 13.9 g/dL (ref 12.0–15.0)
MCH: 28.8 pg (ref 26.0–34.0)
MCHC: 29.5 g/dL — ABNORMAL LOW (ref 30.0–36.0)
MCV: 97.7 fL (ref 80.0–100.0)
Platelets: 269 10*3/uL (ref 150–400)
RBC: 4.82 MIL/uL (ref 3.87–5.11)
RDW: 13.8 % (ref 11.5–15.5)
WBC: 8.8 10*3/uL (ref 4.0–10.5)
nRBC: 0 % (ref 0.0–0.2)

## 2018-04-16 LAB — COMPREHENSIVE METABOLIC PANEL
ALT: 18 U/L (ref 0–44)
AST: 21 U/L (ref 15–41)
Albumin: 3.3 g/dL — ABNORMAL LOW (ref 3.5–5.0)
Alkaline Phosphatase: 90 U/L (ref 38–126)
Anion gap: 12 (ref 5–15)
BUN: 15 mg/dL (ref 6–20)
CO2: 17 mmol/L — ABNORMAL LOW (ref 22–32)
Calcium: 8.9 mg/dL (ref 8.9–10.3)
Chloride: 107 mmol/L (ref 98–111)
Creatinine, Ser: 0.69 mg/dL (ref 0.44–1.00)
GFR calc Af Amer: >60 mL/min (ref >60)
GFR calc non Af Amer: >60 mL/min (ref >60)
Glucose, Bld: 75 mg/dL (ref 70–99)
Potassium: 4.1 mmol/L (ref 3.5–5.1)
Sodium: 136 mmol/L (ref 135–145)
Total Bilirubin: 0.4 mg/dL (ref 0.3–1.2)
Total Protein: 6.8 g/dL (ref 6.5–8.1)

## 2018-04-16 LAB — ETHANOL: Alcohol, Ethyl (B): <10 mg/dL (ref <10)

## 2018-04-16 NOTE — [facility] Suicide Risk Assessment (Cosign Needed)
Suicide Risk Assessment  Discharge Assessment   [facility] Discharge Suicide Risk Assessment   Principal Problem: Adjustment disorder with mixed disturbance of emotions and conduct Discharge Diagnoses: Principal Problem:   Adjustment disorder with mixed disturbance of emotions and conduct   Total Time spent with patient: 30 minutes  Musculoskeletal: Strength & Muscle Tone: within normal limits Gait & Station: normal Patient leans: N/A  Psychiatric Specialty Exam:   Blood pressure (!) 145/100, pulse 81, temperature 97.8 F (36.6 C), temperature source Oral, resp. rate 17, height 5' (1.524 m), weight 99.8 kg, SpO2 100 %.Body mass index is 42.97 kg/m.  General Appearance: Casual  Eye Contact::  Good  Speech:  Clear and Coherent and Normal Rate409  Volume:  Normal  Mood:  Euthymic  Affect:  Congruent  Thought Process:  Coherent and Descriptions of Associations: Intact  Orientation:  Full (Time, Place, and Person)  Thought Content:  Logical  Suicidal Thoughts:  No  Homicidal Thoughts:  No  Memory:  Immediate;   Good Recent;   Good Remote;   Fair  Judgement:  Fair  Insight:  Fair  Psychomotor Activity:  Normal  Concentration:  Fair  Recall:  Good  Fund of Knowledge:Good  Language: Good  Akathisia:  No  Handed:  Right  AIMS (if indicated):     Assets:  Architect Housing Social Support  Sleep:     Cognition: WNL  ADL's:  Intact   Mental Status Per Nursing Assessment::   On Admission:   Pt was seen and chart reviewed with treatment team and dr Sharma Covert. Pt stated she came to the ED to have an evaluation to see if she had a mental disorder and to get medications. Pt is homeless. Pt initially told triage she came to the ED because she was cold. Pt has family in [place]. She has no children. Pt has not given a urine sample for UDS, BAL is negative. Pt will be referred to outpatient providers for her mental health evaluation. Pt will be  given shelter resources. Pt is psychiatrically clear.   Demographic Factors:  Adolescent or young adult  Loss Factors: Financial problems/change in socioeconomic status  Historical Factors: Family history of mental illness or substance abuse  Risk Reduction Factors:   Sense of responsibility to family  Continued Clinical Symptoms:  Alcohol/Substance Abuse/Dependencies  Cognitive Features That Contribute To Risk:  Closed-mindedness    Suicide Risk:  Minimal: No identifiable suicidal ideation.  Patients presenting with no risk factors but with morbid ruminations; may be classified as minimal risk based on the severity of the depressive symptoms   Plan Of Care/Follow-up recommendations:  Activity:  as tolerated Diet:  Heart healthy  Laveda Abbe, NP 04/16/2018, 11:33 AM

## 2018-04-16 NOTE — ED Notes (Signed)
Pt. Belongings are in locker # 30

## 2018-04-16 NOTE — ED Notes (Signed)
PT REQUESTS FOR TEMP CHECK.

## 2018-04-16 NOTE — ED Notes (Signed)
Pt verbalizes she wants a warm place and food

## 2018-04-16 NOTE — ED Notes (Signed)
Pt discharged home. Discharged instructions read to pt who verbalized understanding. All belongings returned to pt who signed for same. Denies SI/HI, is not delusional and not responding to internal stimuli. Escorted pt to the ED exit.    

## 2018-04-16 NOTE — BH Assessment (Signed)
[facility] Assessment Progress Note  Per Juanetta Beets, DO, this pt does not require psychiatric hospitalization at this time.  Pt is to be discharged from [facility] with recommendation to continue treatment with her regular outpatient providers.  Pt is also to be provided with referral information regarding area supportive services for the homeless..  These resources have been included in pt's discharge instructions.  Pt's nurse, Diane, has been notified.  Doylene Canning, MA Triage Specialist [phone]

## 2018-04-16 NOTE — ED Notes (Signed)
Bed: LD35 Expected date:  Expected time:  Means of arrival:  Comments: EMS hypothermic, abd pain

## 2018-04-16 NOTE — ED Notes (Signed)
Pt. Belongings:  1 bottle of omeprazole 40mg   3 cigs 1 black cell phone 1 black/grey bookbag 1 red jacket 1 Black tights 1 acid wash jump suit 1 black flip flop 1 brown purse/ bill fold

## 2018-04-16 NOTE — ED Notes (Signed)
Bed: [facility] Expected date:  Expected time:  Means of arrival:  Comments: Hold for 35

## 2018-04-16 NOTE — ED Triage Notes (Signed)
Pt comes to ed via ems, c/o cold and chronic abdominal pain. V/s on arrival pluse 78, bp 148/ pap, rr16, spo2 98 room air. Temp 98.1. tympanic.

## 2018-04-16 NOTE — BH Assessment (Signed)
[facility] Assessment Progress Note    Per Juanetta Beets, DO, this pt does not require psychiatric hospitalization at this time.  Pt is to be discharged from [facility] with recommendation to continue treatment with her regular outpatient providers.  Pt is also to be provided with referral information regarding area supportive services for the homeless

## 2018-04-16 NOTE — ED Notes (Signed)
Bed: WA30 Expected date:  Expected time:  Means of arrival:  Comments: 

## 2018-04-16 NOTE — ED Triage Notes (Signed)
Patient states she hear peoples conversation. No plans to harm herself or others today. Patient states she wants a mental evaluation.

## 2018-04-16 NOTE — ED Provider Notes (Signed)
[facility]-EMERGENCY DEPT Provider Note   CSN: [id number] Arrival date & time: 04/16/18  0751    History   Chief Complaint Chief Complaint  Patient presents with  . Altered Mental Status    HPI Barbara Benton is a 23 y.o. female.     HPI Patient returns for mental evaluation.  States she has had some flashbacks.  Was just in the hospital a couple hours ago for being cold outside.  States now she is worried.  States she has headaches at times.  States she is hearing other people's conversations.  When discussing this she states she can hear the other side of the room although she is not trying to listen she will sometimes hear them.  Reported history of bipolar and schizophrenia.  Unclear if she is been taking her medicines.  Patient is somewhat difficult to get history from. Past Medical History:  Diagnosis Date  . ADHD (attention deficit hyperactivity disorder)   . Anxiety   . Bipolar disorder (HCC)   . Depression   . GERD (gastroesophageal reflux disease)   . Hypertension   . ODD (oppositional defiant disorder)     Patient Active Problem List   Diagnosis Date Noted  . Schizophrenia, acute (HCC) 01/14/2018  . 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 NO PAST SURGERIES       OB History   No obstetric history on file.      Home Medications    Prior to Admission medications   Medication Sig Start Date End Date Taking? Authorizing Provider  ibuprofen (ADVIL,MOTRIN) 800 MG tablet Take 1 tablet (800 mg total) by mouth every 8 (eight) hours as needed for moderate pain. Take with food 03/27/18   Mike Gip, FNP  omeprazole (PRILOSEC) 40 MG capsule Take 1 capsule (40 mg total) by mouth daily. Patient not taking: Reported on 04/02/2018 02/17/18   Mike Gip, FNP  ziprasidone (GEODON) 20 MG capsule Take 1 capsule (20 mg total) by mouth 2 (two) times daily  with a meal. 01/14/18   Starkes-Perry, Juel Burrow, FNP    Family History Family History  Problem Relation Age of Onset  . Diabetes Maternal Grandmother   . Cancer Paternal Grandfather        type unknown    Social History Social History   Tobacco Use  . Smoking status: Current Every Day Smoker    Packs/day: 0.50    Types: Cigarettes  . Smokeless tobacco: Never Used  Substance Use Topics  . Alcohol use: Yes    Comment: occ  . Drug use: Not Currently    Types: Marijuana, MDMA (Ecstacy)     Allergies   Patient has no known allergies.   Review of Systems Review of Systems  Constitutional: Negative for appetite change.  HENT: Negative for congestion.   Respiratory: Negative for shortness of breath.   Cardiovascular: Negative for chest pain.  Gastrointestinal: Negative for abdominal distention.  Genitourinary: Negative for flank pain.  Musculoskeletal: Negative for back pain.  Neurological: Positive for headaches.  Psychiatric/Behavioral: Positive for hallucinations. Negative for suicidal ideas.     Physical Exam Updated Vital Signs BP (!) 145/100 (BP Location: Right Arm)   Pulse 81   Temp 97.8 F (36.6 C) (Oral)   Resp 17   Ht 5' (1.524 m)   Wt 99.8 kg   SpO2 100%   BMI 42.97 kg/m   Physical Exam  Vitals signs and nursing note reviewed.  HENT:     Head: Atraumatic.  Eyes:     Extraocular Movements: Extraocular movements intact.  Cardiovascular:     Rate and Rhythm: Regular rhythm.  Abdominal:     Tenderness: There is no abdominal tenderness.  Skin:    Capillary Refill: Capillary refill takes less than 2 seconds.  Neurological:     Mental Status: She is alert and oriented to person, place, and time.  Psychiatric:     Comments: Somewhat difficult to get history from.  Somewhat rambling speech.      ED Treatments / Results  Labs (all labs ordered are listed, but only abnormal results are displayed) Labs Reviewed  COMPREHENSIVE METABOLIC PANEL -  Abnormal; Notable for the following components:      Result Value   CO2 17 (*)    Albumin 3.3 (*)    All other components within normal limits  CBC - Abnormal; Notable for the following components:   HCT 47.1 (*)    MCHC 29.5 (*)    All other components within normal limits  ETHANOL  RAPID URINE DRUG SCREEN, HOSP PERFORMED  PREGNANCY, URINE    EKG None  Radiology No results found.  Procedures Procedures (including critical care time)  Medications Ordered in ED Medications - No data to display   Initial Impression / Assessment and Plan / ED Course  I have reviewed the triage vital signs and the nursing notes.  Pertinent labs & imaging results that were available during my care of the patient were reviewed by me and considered in my medical decision making (see chart for details).       Patient presents after being discharged a few hours ago.  Had a cold exposure at that time.  This point has several different complaints.  Labs pending but medically cleared.  To be seen by TTS.  Patient is medically cleared.  Final Clinical Impressions(s) / ED Diagnoses   Final diagnoses:  Schizophrenia, unspecified type (HCC)  Bipolar affective disorder, current episode manic, current episode severity unspecified [facility])    ED Discharge Orders    None       Benjiman Core, MD 04/16/18 1105

## 2018-04-16 NOTE — BH Assessment (Addendum)
Assessment Note  Per EDP Report: Patient brought to the emergency department by ambulance.  Patient was concerned about possible hypothermia because she was outside in the cold for approximately 30 minutes.  She reports that she was starting to shiver and felt extremely cold.  She currently does not have a home, did not have anywhere to go.  She called EMS who brought her to the ER for evaluation.   TTS Assessment:  Patient states that she asked to see TTS because she states that she needs a brain scan to determine what kind of mental illness that she has.  Patient states that she is being treated for depression at the [facility] and that he has been taking her medications compliantly.  She states that her psychiatrist is [facility].  Patient states: "I am on medication for borderline personality disorder."  Patient states that she is currently not suicidal, but states that she has experienced suicidal thought on a couple of occasions in the past, but states that she has never made any attempts to harm herself.  Patient denies any history of HI, but states that she has heard voices in the past, but states that she is currently not hearing any.  Patient denies drug use, but states that she only uses alcohol on occasion. Patient states that she sleep and appetite have been good.Patient denies any history of abuse, but states that she has cut herself in the past on one occasion.  Patient states that she is currently homeless and is on disability.  Patient states that she has never been married and denies having any children.  Patient denied having any legal involvement, but she has two pending traffic violations on the docket for 04/22/2018.  Patient presented as alert and oriented.  Her mood is depressed and her affect flat.  Her memory appears to be intact and her thoughts are organized.  She does not appear to be responding to any internal stimuli.  Her eye contact was good and her speech  coherent.  Patient is unable to provide TTS with phone numbers of family members to obtain collateral information.  Diagnosis: F25.0 Schizoaffective Disorder Bipolar Type  Past Medical History:  Past Medical History:  Diagnosis Date  . ADHD (attention deficit hyperactivity disorder)   . Anxiety   . Bipolar disorder (HCC)   . Depression   . GERD (gastroesophageal reflux disease)   . Hypertension   . ODD (oppositional defiant disorder)     Past Surgical History:  Procedure Laterality Date  . NO PAST SURGERIES      Family History:  Family History  Problem Relation Age of Onset  . Diabetes Maternal Grandmother   . Cancer Paternal Grandfather        type unknown    Social History:  reports that she has been smoking cigarettes. She has been smoking about 0.50 packs per day. She has never used smokeless tobacco. She reports current alcohol use. She reports previous drug use. Drugs: Marijuana and MDMA (Ecstacy).  Additional Social History:  Alcohol / Drug Use Pain Medications: None Prescriptions: Geodon Over the Counter: None History of alcohol / drug use?: Yes Longest period of sobriety (when/how long): patient states that she has never been a problematic drinker Substance #1 Name of Substance 1: alcohol 1 - Age of First Use: unknown 1 - Amount (size/oz): unknown 1 - Frequency: occasionally 1 - Duration: unknown 1 - Last Use / Amount: "awhile back"  CIWA: CIWA-Ar BP: (!) 145/100 Pulse Rate:  81 COWS:    Allergies: No Known Allergies  Home Medications: (Not in a hospital admission)   OB/GYN Status:  No LMP recorded. (Menstrual status: Irregular Periods).  General Assessment Data Location of Assessment: [facility] TTS Assessment: In system Is this a Tele or Face-to-Face Assessment?: Face-to-Face Is this an Initial Assessment or a Re-assessment for this encounter?: Initial Assessment Patient Accompanied by:: N/A Language Other than English: No Living Arrangements:  Homeless/Shelter What gender do you identify as?: Female Marital status: Single Maiden name: Folger Pregnancy Status: No Living Arrangements: Alone Can pt return to current living arrangement?: Yes Admission Status: Voluntary Is patient capable of signing voluntary admission?: Yes Referral Source: Self/Family/Friend Insurance type: (Medicaid)     Crisis Care Plan Living Arrangements: Alone Legal Guardian: Other:(other) Name of Psychiatrist: Blanca Friend Name of Therapist: Mr. Jimmey Ralph at the [facility] [employer]  Education Status Is patient currently in school?: No Is the patient employed, unemployed or receiving disability?: Receiving disability income  Risk to self with the past 6 months Suicidal Ideation: No Has patient been a risk to self within the past 6 months prior to admission? : No Suicidal Intent: No Has patient had any suicidal intent within the past 6 months prior to admission? : No Is patient at risk for suicide?: No Suicidal Plan?: No Has patient had any suicidal plan within the past 6 months prior to admission? : No Access to Means: No What has been your use of drugs/alcohol within the last 12 months?: occasional alcohol use Previous Attempts/Gestures: No(has experienced suicidal thoughts in past, no attempts) How many times?: 2 Other Self Harm Risks: none Triggers for Past Attempts: None known Intentional Self Injurious Behavior: Cutting(states that she has cut on one occasion in the past) Family Suicide History: No Recent stressful life event(s): Other (Comment)(homeless) Persecutory voices/beliefs?: No Depression: Yes Depression Symptoms: Despondent, Isolating, Loss of interest in usual pleasures, Feeling worthless/self pity Substance abuse history and/or treatment for substance abuse?: No Suicide prevention information given to non-admitted patients: Not applicable  Risk to Others within the past 6 months Homicidal Ideation: No Does patient have any  lifetime risk of violence toward others beyond the six months prior to admission? : No Thoughts of Harm to Others: No Current Homicidal Intent: No Current Homicidal Plan: No Access to Homicidal Means: No Identified Victim: no one History of harm to others?: No Assessment of Violence: None Noted Violent Behavior Description: no one Does patient have access to weapons?: No Criminal Charges Pending?: Yes Describe Pending Criminal Charges: Traffic violation Does patient have a court date: Yes Court Date: 04/22/18 Is patient on probation?: No  Psychosis Hallucinations: None noted Delusions: None noted  Mental Status Report Appearance/Hygiene: Unremarkable Eye Contact: Good Motor Activity: Freedom of movement Speech: Logical/coherent Level of Consciousness: Alert Mood: Anxious Affect: Appropriate to circumstance Anxiety Level: Moderate Thought Processes: Coherent, Relevant Judgement: Impaired Orientation: Person, Place, Time, Situation Obsessive Compulsive Thoughts/Behaviors: None  Cognitive Functioning Concentration: Normal Memory: Recent Intact, Remote Intact Is patient IDD: No Insight: Poor Impulse Control: Poor Appetite: Good Have you had any weight changes? : No Change Sleep: No Change Total Hours of Sleep: (8) Vegetative Symptoms: Decreased grooming  ADLScreening [facility] Assessment Services) Patient's cognitive ability adequate to safely complete daily activities?: Yes Patient able to express need for assistance with ADLs?: Yes Independently performs ADLs?: Yes (appropriate for developmental age)  Prior Inpatient Therapy Prior Inpatient Therapy: Yes Prior Therapy Dates: 11/19 Prior Therapy Facilty/Provider(s): [facility] Reason for Treatment: psychosis  Prior Outpatient Therapy  Prior Outpatient Therapy: Yes Prior Therapy Dates: active [facility] Reason for Treatment: unknown Does patient have an ACCT team?: No Does patient  have Intensive In-House Services?  : No Does patient have [facility] services? : No Does patient have P4CC services?: No  ADL Screening (condition at time of admission) Patient's cognitive ability adequate to safely complete daily activities?: Yes Is the patient deaf or have difficulty hearing?: No Does the patient have difficulty seeing, even when wearing glasses/contacts?: No Does the patient have difficulty concentrating, remembering, or making decisions?: No Patient able to express need for assistance with ADLs?: Yes Does the patient have difficulty dressing or bathing?: No Independently performs ADLs?: Yes (appropriate for developmental age) Does the patient have difficulty walking or climbing stairs?: No Weakness of Legs: None Weakness of Arms/Hands: None  Home Assistive Devices/Equipment Home Assistive Devices/Equipment: None  Therapy Consults (therapy consults require a physician order) PT Evaluation Needed: No OT Evalulation Needed: No SLP Evaluation Needed: No Abuse/Neglect Assessment (Assessment to be complete while patient is alone) Abuse/Neglect Assessment Can Be Completed: Yes Physical Abuse: Denies Verbal Abuse: Denies Sexual Abuse: Denies Exploitation of patient/patient's resources: Denies Self-Neglect: Denies Values / Beliefs Cultural Requests During Hospitalization: None Consults Spiritual Care Consult Needed: No Social Work Consult Needed: No Merchant navy officer (For Healthcare) Does Patient Have a Medical Advance Directive?: No Would patient like information on creating a medical advance directive?: No - Patient declined Nutrition Screen- [facility] Patient's home diet: Regular Has the patient recently lost weight without trying?: No Has the patient been eating poorly because of a decreased appetite?: No Malnutrition Screening Tool Score: 0        Disposition: Per Juanetta Beets, DO, this pt does not require psychiatric hospitalization at this  time.  Pt is to be discharged from [facility] with recommendation to continue treatment with her regular outpatient providers.  Pt is also to be provided with referral information regarding area supportive services for the homeless Disposition Initial Assessment Completed for this Encounter: Yes Disposition of Patient: Discharge  On Site Evaluation by:   Reviewed with Physician:    Arnoldo Lenis Jayvon Mounger 04/16/2018 9:33 AM

## 2018-04-16 NOTE — ED Provider Notes (Signed)
[facility]-EMERGENCY DEPT Provider Note   CSN: [id number] Arrival date & time: 04/16/18  0208    History   Chief Complaint Chief Complaint  Patient presents with  . Cold Exposure    HPI Loa Kilian is a 23 y.o. female.     Patient brought to the emergency department by ambulance.  Patient was concerned about possible hypothermia because she was outside in the cold for approximately 30 minutes.  She reports that she was starting to shiver and felt extremely cold.  She currently does not have a home, did not have anywhere to go.  She called EMS who brought her to the ER for evaluation.  On arrival she is still feeling somewhat cold but has otherwise no complaints.     Past Medical History:  Diagnosis Date  . ADHD (attention deficit hyperactivity disorder)   . Anxiety   . Bipolar disorder (HCC)   . Depression   . GERD (gastroesophageal reflux disease)   . Hypertension   . ODD (oppositional defiant disorder)     Patient Active Problem List   Diagnosis Date Noted  . Schizophrenia, acute (HCC) 01/14/2018  . 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 NO PAST SURGERIES       OB History   No obstetric history on file.      Home Medications    Prior to Admission medications   Medication Sig Start Date End Date Taking? Authorizing Provider  ibuprofen (ADVIL,MOTRIN) 800 MG tablet Take 1 tablet (800 mg total) by mouth every 8 (eight) hours as needed for moderate pain. Take with food 03/27/18   Mike Gip, FNP  omeprazole (PRILOSEC) 40 MG capsule Take 1 capsule (40 mg total) by mouth daily. Patient not taking: Reported on 04/02/2018 02/17/18   Mike Gip, FNP  ziprasidone (GEODON) 20 MG capsule Take 1 capsule (20 mg total) by mouth 2 (two) times daily with a meal. 01/14/18   Starkes-Perry, Juel Burrow, FNP    Family History Family History  Problem Relation  Age of Onset  . Diabetes Maternal Grandmother   . Cancer Paternal Grandfather        type unknown    Social History Social History   Tobacco Use  . Smoking status: Current Every Day Smoker    Packs/day: 0.50    Types: Cigarettes  . Smokeless tobacco: Never Used  Substance Use Topics  . Alcohol use: Yes    Comment: occ  . Drug use: Not Currently    Types: Marijuana, MDMA (Ecstacy)     Allergies   Patient has no known allergies.   Review of Systems Review of Systems  Constitutional: Positive for chills.  All other systems reviewed and are negative.    Physical Exam Updated Vital Signs BP (!) 155/107 (BP Location: Right Arm) Comment: RN and EDP notified  Pulse 79   Temp 97.7 F (36.5 C) (Oral)   Resp 18   SpO2 99%   Physical Exam Vitals signs and nursing note reviewed.  Constitutional:      General: She is not in acute distress.    Appearance: Normal appearance. She is well-developed.  HENT:     Head: Normocephalic and atraumatic.     Right Ear: Hearing normal.     Left Ear: Hearing normal.     Nose: Nose normal.  Eyes:     Conjunctiva/sclera: Conjunctivae normal.     Pupils:  Pupils are equal, round, and reactive to light.  Neck:     Musculoskeletal: Normal range of motion and neck supple.  Cardiovascular:     Rate and Rhythm: Regular rhythm.     Heart sounds: S1 normal and S2 normal. No murmur. No friction rub. No gallop.   Pulmonary:     Effort: Pulmonary effort is normal. No respiratory distress.     Breath sounds: Normal breath sounds.  Chest:     Chest wall: No tenderness.  Abdominal:     General: Bowel sounds are normal.     Palpations: Abdomen is soft.     Tenderness: There is no abdominal tenderness. There is no guarding or rebound. Negative signs include Murphy's sign and McBurney's sign.     Hernia: No hernia is present.  Musculoskeletal: Normal range of motion.  Skin:    General: Skin is warm and dry.     Findings: No rash.   Neurological:     Mental Status: She is alert and oriented to person, place, and time.     GCS: GCS eye subscore is 4. GCS verbal subscore is 5. GCS motor subscore is 6.     Cranial Nerves: No cranial nerve deficit.     Sensory: No sensory deficit.     Coordination: Coordination normal.  Psychiatric:        Speech: Speech normal.        Behavior: Behavior normal.        Thought Content: Thought content normal.      ED Treatments / Results  Labs (all labs ordered are listed, but only abnormal results are displayed) Labs Reviewed - No data to display  EKG None  Radiology No results found.  Procedures Procedures (including critical care time)  Medications Ordered in ED Medications - No data to display   Initial Impression / Assessment and Plan / ED Course  I have reviewed the triage vital signs and the nursing notes.  Pertinent labs & imaging results that were available during my care of the patient were reviewed by me and considered in my medical decision making (see chart for details).        Patient was concerned about cold exposure.  Core temperature was 97.7 at arrival.  She was outside in the cold and currently does not have anywhere to stay.  She does not need any work-up or further treatment other than giving her some warm blankets.  Will need to figure out her social circumstances.  Does not require any blood work or other medical work-up.  Final Clinical Impressions(s) / ED Diagnoses   Final diagnoses:  Exposure to weather condition, initial encounter    ED Discharge Orders    None       Pollina, Canary Brim, MD 04/16/18 [phone]

## 2018-04-16 NOTE — ED Notes (Signed)
Denies and pain

## 2018-04-16 NOTE — Discharge Instructions (Addendum)
For your behavioral health needs, you are advised to continue treatment with your regular outpatient providers.  For your shelter needs, contact the following service providers:       [facility]      [address], [place] [address]      [phone]       [facility]      [address]      [place], [place] [address]      [phone]  For transitional housing, contact one of the following agencies.  They provide longer term housing than a shelter, but there is an application process:       [facility]      [address]      [place], [place] [address]      [phone]       [facility]      [address], [place] [address]      [phone]

## 2018-04-16 NOTE — ED Triage Notes (Addendum)
Pt reports feeling cold after 30 min exposure out doors

## 2018-04-16 NOTE — ED Notes (Signed)
Pt given sandwich and cup of water and the risks of staying in the ED during the pandemic were explained to the pt. Pt understood.

## 2018-04-23 ENCOUNTER — Other Ambulatory Visit: Payer: Self-pay

## 2018-04-26 ENCOUNTER — Emergency Department (HOSPITAL_COMMUNITY)
Admission: EM | Admit: 2018-04-26 | Discharge: 2018-04-26 | Disposition: A | Discharge status: Home / Self Care | Payer: Medicaid Other | Attending: Emergency Medicine | Admitting: Emergency Medicine

## 2018-04-26 ENCOUNTER — Encounter (HOSPITAL_COMMUNITY): Payer: Self-pay | Admitting: Emergency Medicine

## 2018-04-26 ENCOUNTER — Other Ambulatory Visit: Payer: Self-pay

## 2018-04-26 DIAGNOSIS — M542 Cervicalgia: Secondary | ICD-10-CM | POA: Insufficient documentation

## 2018-04-26 DIAGNOSIS — F17210 Nicotine dependence, cigarettes, uncomplicated: Secondary | ICD-10-CM | POA: Insufficient documentation

## 2018-04-26 MED ORDER — LIDOCAINE 5 % EX OINT: 1.0000 "application " | TOPICAL_OINTMENT | Freq: Three times a day (TID) | CUTANEOUS | 0 refills | Status: DC | PRN

## 2018-04-26 NOTE — ED Notes (Signed)
Bed: [facility]PT4 Expected date:  Expected time:  Means of arrival:  Comments: 

## 2018-04-26 NOTE — Discharge Instructions (Signed)

## 2018-04-26 NOTE — ED Provider Notes (Signed)
Emergency Department Provider Note   I have reviewed the triage vital signs and the nursing notes.   HISTORY  Chief Complaint Neck Pain   HPI Barbara Benton is a 23 y.o. female with PMH of schizophrenia and GERD presents to the emergency department for evaluation of neck pain.  Patient initially says that her neck pain was acute onset and started approximately 30 minutes prior to arrival.  She later tells me has been actually going on for several weeks and has been in more intermittent.  She describes some radiation down her back.  Pain is worse with movement or touching the area.  She feels like there is a lump in the part of her back.  Denies any numbness or weakness in her arms or legs.  No difficulty walking.  No headaches or fevers.  No vertigo symptoms.  No other modifying factors.  She is been compliant with her other home medications and has no other medical concerns.  Past Medical History:  Diagnosis Date  . ADHD (attention deficit hyperactivity disorder)   . Anxiety   . Bipolar disorder (HCC)   . Depression   . GERD (gastroesophageal reflux disease)   . Hypertension   . ODD (oppositional defiant disorder)     Patient Active Problem List   Diagnosis Date Noted  . Schizophrenia, acute (HCC) 01/14/2018  . 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 NO PAST SURGERIES      Allergies Patient has no known allergies.  Family History  Problem Relation Age of Onset  . Diabetes Maternal Grandmother   . Cancer Paternal Grandfather        type unknown    Social History Social History   Tobacco Use  . Smoking status: Current Every Day Smoker    Packs/day: 0.50    Types: Cigarettes  . Smokeless tobacco: Never Used  Substance Use Topics  . Alcohol use: Yes    Comment: occ  . Drug use: Not Currently    Types: Marijuana, MDMA (Ecstacy)    Review of Systems   Constitutional: No fever/chills Eyes: No visual changes. ENT: No sore throat. Positive neck pain.  Cardiovascular: Denies chest pain. Respiratory: Denies shortness of breath. Gastrointestinal: No abdominal pain.  No nausea, no vomiting.  No diarrhea.  No constipation. Genitourinary: Negative for dysuria. Musculoskeletal: Negative for back pain. Skin: Negative for rash. Neurological: Negative for headaches, focal weakness or numbness.  10-point ROS otherwise negative.  ____________________________________________   PHYSICAL EXAM:  VITAL SIGNS: ED Triage Vitals  Enc Vitals Group     BP 04/26/18 1951 (!) 149/96     Pulse Rate 04/26/18 1951 92     Resp 04/26/18 1951 18     Temp 04/26/18 1951 (!) 97.4 F (36.3 C)     Temp Source 04/26/18 1951 Oral     SpO2 04/26/18 1951 100 %     Pain Score 04/26/18 1948 10   Constitutional: Alert and oriented. Well appearing and in no acute distress. Eyes: Conjunctivae are normal. Head: Atraumatic. Nose: No congestion/rhinnorhea. Mouth/Throat: Mucous membranes are moist.  Oropharynx non-erythematous. Neck: No stridor.  No meningeal signs.  No cervical spine tenderness to palpation. Full ROM of the cervical spine without difficulty.  Cardiovascular: Normal rate, regular rhythm. Good peripheral circulation. Grossly normal heart sounds.   Respiratory: Normal respiratory effort.  No retractions. Lungs CTAB. Gastrointestinal: Soft and nontender. No distention.  Musculoskeletal: No  lower extremity tenderness nor edema. No gross deformities of extremities. Neurologic:  Normal speech and language. No gross focal neurologic deficits are appreciated.  Skin:  Skin is warm, dry and intact. No rash noted.  ____________________________________________  RADIOLOGY  None  ____________________________________________   PROCEDURES  Procedure(s) performed:   Procedures  None  ____________________________________________   INITIAL IMPRESSION /  ASSESSMENT AND PLAN / ED COURSE  Pertinent labs & imaging results that were available during my care of the patient were reviewed by me and considered in my medical decision making (see chart for details).   Patient presents to the emergency department for evaluation of neck pain.  She has mild tenderness over the paraspinal musculature.  No midline discomfort.  No neuro findings.  No concern clinically for meningitis.  No recent injury.  No indication for imaging at this time.  Plan for treatment of likely MSK pain and have her follow with her PCP. Discussed ED return precautions.    ____________________________________________  FINAL CLINICAL IMPRESSION(S) / ED DIAGNOSES  Final diagnoses:  Neck pain    NEW OUTPATIENT MEDICATIONS STARTED DURING THIS VISIT:  New Prescriptions   LIDOCAINE (XYLOCAINE) 5 % OINTMENT    Apply 1 application topically 3 (three) times daily as needed.    Note:  This document was prepared using Dragon voice recognition software and may include unintentional dictation errors.  Alona BeneJoshua Shamaine Mulkern, MD Emergency Medicine    Ginny Loomer, Arlyss RepressJoshua G, MD 04/26/18 2029

## 2018-04-26 NOTE — ED Triage Notes (Signed)
Patient here from home with complaints of sudden onset of neck pain that started ago right down center of neck. Ambulatory.

## 2018-04-27 ENCOUNTER — Other Ambulatory Visit: Payer: Self-pay

## 2018-04-27 ENCOUNTER — Emergency Department (HOSPITAL_COMMUNITY)
Admission: EM | Admit: 2018-04-27 | Discharge: 2018-04-27 | Disposition: A | Discharge status: Home / Self Care | Payer: Medicaid Other | Attending: Emergency Medicine | Admitting: Emergency Medicine

## 2018-04-27 DIAGNOSIS — F250 Schizoaffective disorder, bipolar type: Secondary | ICD-10-CM | POA: Insufficient documentation

## 2018-04-27 DIAGNOSIS — Z046 Encounter for general psychiatric examination, requested by authority: Secondary | ICD-10-CM | POA: Insufficient documentation

## 2018-04-27 DIAGNOSIS — J020 Streptococcal pharyngitis: Secondary | ICD-10-CM | POA: Diagnosis not present

## 2018-04-27 DIAGNOSIS — F17210 Nicotine dependence, cigarettes, uncomplicated: Secondary | ICD-10-CM | POA: Insufficient documentation

## 2018-04-27 DIAGNOSIS — M791 Myalgia, unspecified site: Secondary | ICD-10-CM | POA: Diagnosis present

## 2018-04-27 DIAGNOSIS — F419 Anxiety disorder, unspecified: Secondary | ICD-10-CM | POA: Insufficient documentation

## 2018-04-27 DIAGNOSIS — R465 Suspiciousness and marked evasiveness: Secondary | ICD-10-CM | POA: Diagnosis not present

## 2018-04-27 DIAGNOSIS — Z79899 Other long term (current) drug therapy: Secondary | ICD-10-CM | POA: Insufficient documentation

## 2018-04-27 DIAGNOSIS — Z9114 Patient's other noncompliance with medication regimen: Secondary | ICD-10-CM | POA: Diagnosis not present

## 2018-04-27 DIAGNOSIS — R197 Diarrhea, unspecified: Secondary | ICD-10-CM | POA: Insufficient documentation

## 2018-04-27 DIAGNOSIS — R451 Restlessness and agitation: Secondary | ICD-10-CM | POA: Insufficient documentation

## 2018-04-27 DIAGNOSIS — I10 Essential (primary) hypertension: Secondary | ICD-10-CM | POA: Diagnosis not present

## 2018-04-27 LAB — CBC WITH DIFFERENTIAL/PLATELET
Abs Immature Granulocytes: 0.04 10*3/uL (ref 0.00–0.07)
Basophils Absolute: 0 10*3/uL (ref 0.0–0.1)
Basophils Relative: 0 %
Eosinophils Absolute: 0.1 10*3/uL (ref 0.0–0.5)
Eosinophils Relative: 0 %
HCT: 45.5 % (ref 36.0–46.0)
Hemoglobin: 14.4 g/dL (ref 12.0–15.0)
Immature Granulocytes: 0 %
Lymphocytes Relative: 12 %
Lymphs Abs: 1.4 10*3/uL (ref 0.7–4.0)
MCH: 29 pg (ref 26.0–34.0)
MCHC: 31.6 g/dL (ref 30.0–36.0)
MCV: 91.7 fL (ref 80.0–100.0)
Monocytes Absolute: 0.7 10*3/uL (ref 0.1–1.0)
Monocytes Relative: 6 %
Neutro Abs: 9 10*3/uL — ABNORMAL HIGH (ref 1.7–7.7)
Neutrophils Relative %: 82 %
Platelets: 296 10*3/uL (ref 150–400)
RBC: 4.96 MIL/uL (ref 3.87–5.11)
RDW: 14 % (ref 11.5–15.5)
WBC: 11.1 10*3/uL — ABNORMAL HIGH (ref 4.0–10.5)
nRBC: 0 % (ref 0.0–0.2)

## 2018-04-27 LAB — URINALYSIS, ROUTINE W REFLEX MICROSCOPIC
Bacteria, UA: NONE SEEN
Bilirubin Urine: NEGATIVE
Glucose, UA: NEGATIVE mg/dL
Hgb urine dipstick: NEGATIVE
Ketones, ur: NEGATIVE mg/dL
Nitrite: NEGATIVE
Protein, ur: NEGATIVE mg/dL
Specific Gravity, Urine: 1.021 (ref 1.005–1.030)
pH: 6 (ref 5.0–8.0)

## 2018-04-27 LAB — COMPREHENSIVE METABOLIC PANEL
ALT: 23 U/L (ref 0–44)
AST: 19 U/L (ref 15–41)
Albumin: 4.1 g/dL (ref 3.5–5.0)
Alkaline Phosphatase: 113 U/L (ref 38–126)
Anion gap: 8 (ref 5–15)
BUN: 15 mg/dL (ref 6–20)
CO2: 26 mmol/L (ref 22–32)
Calcium: 9.3 mg/dL (ref 8.9–10.3)
Chloride: 104 mmol/L (ref 98–111)
Creatinine, Ser: 0.75 mg/dL (ref 0.44–1.00)
GFR calc Af Amer: >60 mL/min (ref >60)
GFR calc non Af Amer: >60 mL/min (ref >60)
Glucose, Bld: 109 mg/dL — ABNORMAL HIGH (ref 70–99)
Potassium: 3.7 mmol/L (ref 3.5–5.1)
Sodium: 138 mmol/L (ref 135–145)
Total Bilirubin: 0.4 mg/dL (ref 0.3–1.2)
Total Protein: 7.9 g/dL (ref 6.5–8.1)

## 2018-04-27 LAB — RAPID URINE DRUG SCREEN, HOSP PERFORMED
Amphetamines: NOT DETECTED
Barbiturates: NOT DETECTED
Benzodiazepines: NOT DETECTED
Cocaine: NOT DETECTED
Opiates: NOT DETECTED
Tetrahydrocannabinol: NOT DETECTED

## 2018-04-27 LAB — SALICYLATE LEVEL: Salicylate Lvl: <7 mg/dL (ref 2.8–30.0)

## 2018-04-27 LAB — I-STAT BETA HCG BLOOD, ED (MC, WL, AP ONLY): I-stat hCG, quantitative: <5 m[IU]/mL (ref <5)

## 2018-04-27 LAB — ACETAMINOPHEN LEVEL: Acetaminophen (Tylenol), Serum: <10 ug/mL — ABNORMAL LOW (ref 10–30)

## 2018-04-27 LAB — LIPASE, BLOOD: Lipase: 27 U/L (ref 11–51)

## 2018-04-27 LAB — GROUP A STREP BY PCR: Group A Strep by PCR: DETECTED — AB

## 2018-04-27 LAB — ETHANOL: Alcohol, Ethyl (B): <10 mg/dL (ref <10)

## 2018-04-27 MED ORDER — MENTHOL 3 MG MT LOZG
1.0000 | LOZENGE | OROMUCOSAL | Status: DC | PRN
  Filled 2018-04-27: qty 9

## 2018-04-27 MED ORDER — PENICILLIN G BENZATHINE 1200000 UNIT/2ML IM SUSP
1.2000 10*6.[IU] | Freq: Once | INTRAMUSCULAR | Status: AC
  Administered 2018-04-27: 18:00:00 1.2 10*6.[IU] via INTRAMUSCULAR
  Filled 2018-04-27: qty 2

## 2018-04-27 MED ORDER — SODIUM CHLORIDE 0.9 % IV BOLUS: 1000.0000 mL | Freq: Once | INTRAVENOUS | Status: DC

## 2018-04-27 MED ORDER — ACETAMINOPHEN 325 MG PO TABS
650.0000 mg | ORAL_TABLET | Freq: Once | ORAL | Status: AC
  Administered 2018-04-27: 650 mg via ORAL
  Filled 2018-04-27: qty 2

## 2018-04-27 MED ORDER — PENICILLIN G BENZATHINE & PROC 1200000 UNIT/2ML IM SUSP: 1.2000 10*6.[IU] | Freq: Once | INTRAMUSCULAR | Status: DC

## 2018-04-27 MED ORDER — DEXAMETHASONE 1 MG/ML PO CONC
10.0000 mg | Freq: Once | ORAL | Status: AC
  Administered 2018-04-27: 10 mg via ORAL
  Filled 2018-04-27: qty 10

## 2018-04-27 NOTE — Discharge Instructions (Signed)
For shelter needs, please go to   [facility] [address] [place], [place] [address]  [phone]  OR  Call [facility] at [phone] to see if they have any available housing or shelter options  For your mental health needs, please go to:   [facility]  [address]  [place], [place]  [phone]

## 2018-04-27 NOTE — ED Notes (Signed)
Bed: WA12 Expected date:  Expected time:  Means of arrival:  Comments: Neg Pressure 

## 2018-04-27 NOTE — ED Notes (Signed)
Jacki Cones, NP called from [facility] and reported that pt is psych clear and can be d/c once she is medically cleared

## 2018-04-27 NOTE — Progress Notes (Addendum)
Patient ID: Barbara Benton, female   DOB: [birth year], 23 y.o.   MRN: [id number]  Pt was seen and chart reviewed by treatment team and Dr Lucianne Muss. Pt is known to this provider and the emergency room system. Pt was in the emergency room yesterday for neck pain and today was at [facility] throwing chips at people because she said heard people talking about her. Of note she does have strep throat and received PCN IM today. Pt is homeless. She state she has family in the area but she said she "can't got to their home", she did not elaborate as to why. She  Has had 8 ED visits in 6 months for either psychiatric or somatic complaints. Pt denies suicidal and homicidal ideation and denies visual hallucinations. Pt has a history of schizophrenia, it is unclear whether she is taking any medications. Pt has a high degree of secondary gain by seeking shelter in the emergency room due to being homeless and her report of not liking to go to the shelter.  Pt's UDS and BAL are negative. Pt will be given outpatient and shelter resources. Pt is psychiatrically clear.   Laveda Abbe, FNP-C 04/27/2018          1723

## 2018-04-27 NOTE — BH Assessment (Addendum)
Tele Assessment Note   Patient Name: Barbara Benton MRN: [id number] Referring Physician: Freida Busman Location of Patient: [facility] Location of Provider: Behavioral Health TTS Department  Barbara Benton is an 23 y.o. female who presented to [facility] via EMS after having been picked up at [facility] for bizarre and paranoid behavior.  Patient felt like people were watching her and talking about her and she was throwing chips at customers.  Patient is alert and oriented and states that she probably feels more paranoid these days because she is homeless and living on the streets and states that she has not been taking her medications consistently.  Patient states that she is not homicidal, suicidal and states that she is not experiencing any auditory or visual hallucinations.  Patient denies any current drug or alcohol use.Patient states that since she arrived to the ED that she is feeling better.  When asked if she felt like she needed to be in the [facility], patient stated, "no."  Patient states that she has 5,000 facebook friends, but none of them will provide her with a place to stay.  Patient states that she is not eligible to go to the local shelters.  Patient presented as alert and oriented, her thoughts were organized and her memory was intact.  Patient was calm and rational during her assessment process.  She did not appear to be responding to any internal stimuli.  With her diagnosis, her judgment, insight and impulse control are always partially impaired, but patient was able to contract for safety today and appeared to be at her baseline.  Diagnosis: F25.0 Schizo-affective Disorder Bipolar Type  Past Medical History:  Past Medical History:  Diagnosis Date  . ADHD (attention deficit hyperactivity disorder)   . Anxiety   . Bipolar disorder (HCC)   . Depression   . GERD (gastroesophageal reflux disease)   . Hypertension   . ODD (oppositional defiant disorder)     Past Surgical History:   Procedure Laterality Date  . NO PAST SURGERIES      Family History:  Family History  Problem Relation Age of Onset  . Diabetes Maternal Grandmother   . Cancer Paternal Grandfather        type unknown    Social History:  reports that she has been smoking cigarettes. She has been smoking about 0.50 packs per day. She has never used smokeless tobacco. She reports current alcohol use. She reports previous drug use. Drugs: Marijuana and MDMA (Ecstacy).  Additional Social History:  Alcohol / Drug Use Pain Medications: None Prescriptions: Geodon Over the Counter: None History of alcohol / drug use?: Yes Longest period of sobriety (when/how long): patient states that she has never been a problematic drinker Substance #1 Name of Substance 1: alcohol 1 - Age of First Use: unknown 1 - Amount (size/oz): unknown 1 - Frequency: occasionally 1 - Duration: unknown 1 - Last Use / Amount: "awhile back"  CIWA: CIWA-Ar BP: 131/86 Pulse Rate: 98 COWS:    Allergies: No Known Allergies  Home Medications: (Not in a hospital admission)   OB/GYN Status:  Patient's last menstrual period was 04/24/2018 (approximate).  General Assessment Data Location of Assessment: [facility] TTS Assessment: In system Is this a Tele or Face-to-Face Assessment?: Face-to-Face Is this an Initial Assessment or a Re-assessment for this encounter?: Initial Assessment Patient Accompanied by:: N/A Language Other than English: No Living Arrangements: Homeless/Shelter What gender do you identify as?: Female Marital status: Single Maiden name: Murley) Pregnancy Status: No Living Arrangements:  Alone Can pt return to current living arrangement?: Yes Admission Status: Voluntary Is patient capable of signing voluntary admission?: Yes Referral Source: Self/Family/Friend Insurance type: (Medicaid)     Crisis Care Plan Living Arrangements: Alone Legal Guardian: Other: Name of Psychiatrist: Blanca Friend(Clarissa Parks) Name of  Therapist: [facility]  Education Status Is patient currently in school?: No Is the patient employed, unemployed or receiving disability?: Receiving disability income  Risk to self with the past 6 months Suicidal Ideation: No Has patient been a risk to self within the past 6 months prior to admission? : No Suicidal Intent: No Has patient had any suicidal intent within the past 6 months prior to admission? : No Is patient at risk for suicide?: No Suicidal Plan?: No Has patient had any suicidal plan within the past 6 months prior to admission? : No Access to Means: No What has been your use of drugs/alcohol within the last 12 months?: (occasional alcohol) Previous Attempts/Gestures: No(thoughts in part, but no attempts) How many times?: 2 Other Self Harm Risks: none Triggers for Past Attempts: None known Intentional Self Injurious Behavior: Cutting Family Suicide History: No Recent stressful life event(s): Other (Comment) Persecutory voices/beliefs?: No Depression: Yes Depression Symptoms: Despondent, Insomnia, Isolating, Loss of interest in usual pleasures, Feeling angry/irritable Substance abuse history and/or treatment for substance abuse?: No Suicide prevention information given to non-admitted patients: Not applicable  Risk to Others within the past 6 months Homicidal Ideation: No Does patient have any lifetime risk of violence toward others beyond the six months prior to admission? : No Thoughts of Harm to Others: No Current Homicidal Intent: No Current Homicidal Plan: No Access to Homicidal Means: No Identified Victim: no one History of harm to others?: No Assessment of Violence: None Noted Violent Behavior Description: no one Does patient have access to weapons?: No Criminal Charges Pending?: No Is patient on probation?: No  Psychosis Hallucinations: None noted Delusions: (paranoid delusions, ideas of reference)  Mental Status Report Appearance/Hygiene:  Unremarkable Eye Contact: Good Motor Activity: Unremarkable Speech: Logical/coherent Level of Consciousness: Alert Mood: Anxious Affect: Appropriate to circumstance Anxiety Level: Moderate Thought Processes: Coherent, Relevant Judgement: Impaired Orientation: Person, Place, Time, Situation Obsessive Compulsive Thoughts/Behaviors: None  Cognitive Functioning Concentration: Normal Memory: Recent Intact, Remote Intact Is patient IDD: No Insight: Poor Impulse Control: Poor Appetite: Good Have you had any weight changes? : No Change Sleep: No Change Total Hours of Sleep: 8 Vegetative Symptoms: Decreased grooming  ADLScreening [facility]) Patient's cognitive ability adequate to safely complete daily activities?: Yes Patient able to express need for assistance with ADLs?: Yes Independently performs ADLs?: Yes (appropriate for developmental age)  Prior Inpatient Therapy Prior Inpatient Therapy: Yes Prior Therapy Dates: 11/2017 [facility] Reason for Treatment: psychosis  Prior Outpatient Therapy Prior Outpatient Therapy: Yes Prior Therapy Dates: (active) Prior Therapy Facilty/Provider(s): [facility] Reason for Treatment: unknown Does patient have an ACCT team?: No Does patient have Intensive In-House Services?  : No Does patient have [facility] services? : No Does patient have [facility] services?: No  ADL Screening (condition at time of admission) Patient's cognitive ability adequate to safely complete daily activities?: Yes Is the patient deaf or have difficulty hearing?: No Does the patient have difficulty seeing, even when wearing glasses/contacts?: No Does the patient have difficulty concentrating, remembering, or making decisions?: No Patient able to express need for assistance with ADLs?: Yes Does the patient have difficulty dressing or bathing?: No Independently performs ADLs?: Yes (appropriate for developmental age) Does the  patient have difficulty walking or climbing stairs?: No Weakness of Legs: None Weakness of Arms/Hands: None  Home Assistive Devices/Equipment Home Assistive Devices/Equipment: None  Therapy Consults (therapy consults require a physician order) PT Evaluation Needed: No OT Evalulation Needed: No SLP Evaluation Needed: No Abuse/Neglect Assessment (Assessment to be complete while patient is alone) Abuse/Neglect Assessment Can Be Completed: Yes Physical Abuse: Denies Verbal Abuse: Denies Sexual Abuse: Denies Exploitation of patient/patient's resources: Denies Self-Neglect: Denies Values / Beliefs Cultural Requests During Hospitalization: None Spiritual Requests During Hospitalization: None Consults Spiritual Care Consult Needed: No Social Work Consult Needed: No Merchant navy officer (For Healthcare) Does Patient Have a Medical Advance Directive?: No Would patient like information on creating a medical advance directive?: No - Guardian declined Nutrition Screen- [facility] Adult/[facility] Has the patient recently lost weight without trying?: No Has the patient been eating poorly because of a decreased appetite?: No Malnutrition Screening Tool Score: 0        Disposition: Per Elta Guadeloupe, NP, patient does not meet inpation psychiatric admission criteria Disposition Initial Assessment Completed for this Encounter: Yes Patient referred to: Other (Comment)([facility])  This service was provided via telemedicine using a 2-way, interactive audio and video technology.  Names of all persons participating in this telemedicine service and their role in this encounter. Name: Toral Bester Role: patient  Name: Dannielle Huh Osten Janek Role: TTS  Name: Elta Guadeloupe Role: FNP  Name:  Role:     Daphene Calamity 04/27/2018 5:46 PM

## 2018-04-27 NOTE — ED Notes (Signed)
Pt requesting a throat lozenge due to "feels like my throat is closing up".  Brendolyn Patty PA aware, awaiting new orders.

## 2018-04-27 NOTE — ED Triage Notes (Signed)
Pt was picked up by [facility] from [facility] where she was throwing chips because she was thinking people were talking about her.   Pt has upper abd pains and was seen here yesterday for it and was prescribed Lidocaine patches that hasnt gotten filled.  Pt does have temperature of 101 per EMS. Denies cough
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
## 2018-04-27 NOTE — ED Notes (Signed)
ED Provider at bedside. 

## 2018-04-29 ENCOUNTER — Telehealth: Payer: Self-pay

## 2018-04-29 DIAGNOSIS — R1084 Generalized abdominal pain: Secondary | ICD-10-CM

## 2018-04-29 NOTE — Telephone Encounter (Signed)
Patient needs a referral to a Dermatologist for bumps on her face.

## 2018-04-30 MED ORDER — IBUPROFEN 800 MG PO TABS: 800.0000 mg | ORAL_TABLET | Freq: Three times a day (TID) | ORAL | 0 refills | Status: DC | PRN

## 2018-04-30 NOTE — Telephone Encounter (Signed)
Medication has been sent and patient is scheduled for appointment tomorrow

## 2018-05-01 ENCOUNTER — Other Ambulatory Visit: Payer: Self-pay

## 2018-05-01 ENCOUNTER — Ambulatory Visit (INDEPENDENT_AMBULATORY_CARE_PROVIDER_SITE_OTHER): Payer: Medicaid Other | Admitting: Family Medicine

## 2018-05-01 ENCOUNTER — Encounter: Payer: Self-pay | Admitting: Family Medicine

## 2018-05-01 DIAGNOSIS — L309 Dermatitis, unspecified: Secondary | ICD-10-CM | POA: Diagnosis not present

## 2018-05-01 MED ORDER — CLINDAMYCIN PHOSPHATE 1 % EX GEL: Freq: Every day | CUTANEOUS | 1 refills | Status: DC
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
## 2018-05-05 ENCOUNTER — Encounter (HOSPITAL_COMMUNITY): Payer: Self-pay

## 2018-05-05 ENCOUNTER — Other Ambulatory Visit: Payer: Self-pay

## 2018-05-05 ENCOUNTER — Emergency Department (HOSPITAL_COMMUNITY)
Admission: EM | Admit: 2018-05-05 | Discharge: 2018-05-05 | Disposition: A | Discharge status: Home / Self Care | Payer: Medicaid Other | Attending: Emergency Medicine | Admitting: Emergency Medicine

## 2018-05-05 DIAGNOSIS — Y999 Unspecified external cause status: Secondary | ICD-10-CM | POA: Diagnosis not present

## 2018-05-05 DIAGNOSIS — T148XXA Other injury of unspecified body region, initial encounter: Secondary | ICD-10-CM

## 2018-05-05 DIAGNOSIS — Y929 Unspecified place or not applicable: Secondary | ICD-10-CM | POA: Insufficient documentation

## 2018-05-05 DIAGNOSIS — Y939 Activity, unspecified: Secondary | ICD-10-CM | POA: Insufficient documentation

## 2018-05-05 DIAGNOSIS — F410 Panic disorder [episodic paroxysmal anxiety] without agoraphobia: Secondary | ICD-10-CM | POA: Diagnosis not present

## 2018-05-05 DIAGNOSIS — F17210 Nicotine dependence, cigarettes, uncomplicated: Secondary | ICD-10-CM | POA: Diagnosis not present

## 2018-05-05 DIAGNOSIS — F419 Anxiety disorder, unspecified: Secondary | ICD-10-CM | POA: Diagnosis present

## 2018-05-05 DIAGNOSIS — X509XXA Other and unspecified overexertion or strenuous movements or postures, initial encounter: Secondary | ICD-10-CM | POA: Diagnosis not present

## 2018-05-05 DIAGNOSIS — S161XXA Strain of muscle, fascia and tendon at neck level, initial encounter: Secondary | ICD-10-CM | POA: Diagnosis not present

## 2018-05-05 MED ORDER — METHOCARBAMOL 500 MG PO TABS
500.0000 mg | ORAL_TABLET | Freq: Once | ORAL | Status: AC
  Administered 2018-05-05: 500 mg via ORAL
  Filled 2018-05-05: qty 1

## 2018-05-05 MED ORDER — METHOCARBAMOL 500 MG PO TABS: 500.0000 mg | ORAL_TABLET | Freq: Two times a day (BID) | ORAL | 0 refills | Status: DC

## 2018-05-05 NOTE — ED Provider Notes (Signed)
[facility]-EMERGENCY DEPT Provider Note   CSN: [id number] Arrival date & time: 05/05/18  2102    History   Chief Complaint Chief Complaint  Patient presents with  . Anxiety    HPI Barbara Benton is a 23 y.o. female.     23 year old female presents with increased anxiety as well as worsening chronic neck pain.  Patient states her neck pain has been going on for several years and is localized to her trapezius muscle distribution.  Denies any new trauma.  Pain characterizes cramping worse with movement and better with remaining still.  Has been seen in the past for this and prescribed lidocaine patches as well as ibuprofen but she has not had a lidocaine patch prescription filled.  No weakness in arms or legs.  No bowel or bladder dysfunction.  States that she had a stressful event today that caused her to become more anxious.  Denies any SI or HI.     Past Medical History:  Diagnosis Date  . ADHD (attention deficit hyperactivity disorder)   . Anxiety   . Bipolar disorder (HCC)   . Depression   . GERD (gastroesophageal reflux disease)   . Hypertension   . ODD (oppositional defiant disorder)     Patient Active Problem List   Diagnosis Date Noted  . Schizophrenia, acute (HCC) 01/14/2018  . 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 NO PAST SURGERIES       OB History   No obstetric history on file.      Home Medications    Prior to Admission medications   Medication Sig Start Date End Date Taking? Authorizing Provider  clindamycin (CLINDAGEL) 1 % gel Apply topically at bedtime. 05/01/18   Mike Gip, FNP  Hyprom-Naphaz-Polysorb-Zn Sulf (CLEAR EYES COMPLETE OP) Apply 1 drop to eye daily as needed (allergies, eye irritation).    [provider]  ibuprofen (ADVIL,MOTRIN) 800 MG tablet Take 1 tablet (800 mg total) by mouth every 8 (eight) hours as  needed for moderate pain. Take with food 04/30/18   Mike Gip, FNP  ziprasidone (GEODON) 20 MG capsule Take 1 capsule (20 mg total) by mouth 2 (two) times daily with a meal. Patient not taking: Reported on 04/27/2018 01/14/18   Maryagnes Amos, FNP    Family History Family History  Problem Relation Age of Onset  . Diabetes Maternal Grandmother   . Cancer Paternal Grandfather        type unknown    Social History Social History   Tobacco Use  . Smoking status: Current Every Day Smoker    Packs/day: 0.50    Types: Cigarettes  . Smokeless tobacco: Never Used  Substance Use Topics  . Alcohol use: Yes    Comment: occ  . Drug use: Not Currently    Types: Marijuana, MDMA (Ecstacy)     Allergies   Patient has no known allergies.   Review of Systems Review of Systems  All other systems reviewed and are negative.    Physical Exam Updated Vital Signs BP (!) 152/100 (BP Location: Left Arm)   Pulse 92   Temp 98.4 F (36.9 C) (Oral)   Resp 16   Ht 1.575 m (5\' 2" )   Wt 99.8 kg   LMP 04/24/2018 (Approximate)   SpO2 100%   BMI 40.24 kg/m   Physical Exam Vitals signs and nursing note reviewed.  Constitutional:  General: She is not in acute distress.    Appearance: Normal appearance. She is well-developed. She is not toxic-appearing.  HENT:     Head: Normocephalic and atraumatic.  Eyes:     General: Lids are normal.     Conjunctiva/sclera: Conjunctivae normal.     Pupils: Pupils are equal, round, and reactive to light.  Neck:     Musculoskeletal: Normal range of motion and neck supple.     Thyroid: No thyroid mass.     Trachea: No tracheal deviation.  Cardiovascular:     Rate and Rhythm: Normal rate and regular rhythm.     Heart sounds: Normal heart sounds. No murmur. No gallop.   Pulmonary:     Effort: Pulmonary effort is normal. No respiratory distress.     Breath sounds: Normal breath sounds. No stridor. No decreased breath sounds, wheezing,  rhonchi or rales.  Abdominal:     General: Bowel sounds are normal. There is no distension.     Palpations: Abdomen is soft.     Tenderness: There is no abdominal tenderness. There is no rebound.  Musculoskeletal: Normal range of motion.        General: No tenderness.       Back:  Skin:    General: Skin is warm and dry.     Findings: No abrasion or rash.  Neurological:     Mental Status: She is alert and oriented to person, place, and time.     GCS: GCS eye subscore is 4. GCS verbal subscore is 5. GCS motor subscore is 6.     Cranial Nerves: No cranial nerve deficit.     Sensory: No sensory deficit.  Psychiatric:        Mood and Affect: Mood is anxious.        Speech: Speech normal.        Behavior: Behavior normal.        Thought Content: Thought content does not include homicidal or suicidal plan.      ED Treatments / Results  Labs (all labs ordered are listed, but only abnormal results are displayed) Labs Reviewed - No data to display  EKG None  Radiology No results found.  Procedures Procedures (including critical care time)  Medications Ordered in ED Medications  methocarbamol (ROBAXIN) tablet 500 mg (has no administration in time range)     Initial Impression / Assessment and Plan / ED Course  I have reviewed the triage vital signs and the nursing notes.  Pertinent labs & imaging results that were available during my care of the patient were reviewed by me and considered in my medical decision making (see chart for details).        Patient given Robaxin.  Suspect musculoskeletal strain.  Her anxiety is now resolved.  Return precautions given  Final Clinical Impressions(s) / ED Diagnoses   Final diagnoses:  None    ED Discharge Orders    None       Lorre NickAllen, Meili Kleckley, MD 05/05/18 2156

## 2018-05-05 NOTE — ED Triage Notes (Signed)
Pt states that she did not take her anxiety, medications, and then states that she needs to get it changed and then states that he medication is at a friends. Pt does report that she stays with friends but its at the moment homeless. Pt c/o  neck pains as well.
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
## 2018-05-08 NOTE — Telephone Encounter (Signed)
Dr. Christella Hartigan,  Is it okay to continue to delay CT due to Covid -19 until after 06/16/18?

## 2018-05-10 ENCOUNTER — Emergency Department (HOSPITAL_COMMUNITY)
Admission: EM | Admit: 2018-05-10 | Discharge: 2018-05-10 | Disposition: A | Discharge status: Home / Self Care | Payer: Medicaid Other | Attending: Emergency Medicine | Admitting: Emergency Medicine

## 2018-05-10 ENCOUNTER — Other Ambulatory Visit: Payer: Self-pay

## 2018-05-10 DIAGNOSIS — R21 Rash and other nonspecific skin eruption: Secondary | ICD-10-CM | POA: Insufficient documentation

## 2018-05-10 DIAGNOSIS — Z79899 Other long term (current) drug therapy: Secondary | ICD-10-CM | POA: Diagnosis not present

## 2018-05-10 DIAGNOSIS — N898 Other specified noninflammatory disorders of vagina: Secondary | ICD-10-CM | POA: Diagnosis present

## 2018-05-10 DIAGNOSIS — I10 Essential (primary) hypertension: Secondary | ICD-10-CM | POA: Diagnosis not present

## 2018-05-10 DIAGNOSIS — R2242 Localized swelling, mass and lump, left lower limb: Secondary | ICD-10-CM | POA: Insufficient documentation

## 2018-05-10 DIAGNOSIS — N760 Acute vaginitis: Secondary | ICD-10-CM | POA: Insufficient documentation

## 2018-05-10 DIAGNOSIS — F17210 Nicotine dependence, cigarettes, uncomplicated: Secondary | ICD-10-CM | POA: Diagnosis not present

## 2018-05-10 DIAGNOSIS — B9689 Other specified bacterial agents as the cause of diseases classified elsewhere: Secondary | ICD-10-CM | POA: Insufficient documentation

## 2018-05-10 LAB — WET PREP, GENITAL
Sperm: NONE SEEN
Trich, Wet Prep: NONE SEEN
Yeast Wet Prep HPF POC: NONE SEEN

## 2018-05-10 MED ORDER — METRONIDAZOLE 0.75 % VA GEL: 1.0000 | Freq: Every day | VAGINAL | 0 refills | Status: AC

## 2018-05-10 NOTE — ED Notes (Signed)
Bed: WA05 Expected date:  Expected time:  Means of arrival:  Comments: 

## 2018-05-10 NOTE — ED Notes (Addendum)
Pt is alert and oriented x 4 and is verbally responsive. Pt reports vaginal itching and discharge. Pt states that she was not sure of the color of the discharge but there was a discharge.

## 2018-05-10 NOTE — Discharge Instructions (Addendum)
Use Metrogel as prescribed and complete the full course. Apply Bacitracin to face as needed. This is available over the counter. Follow up with your doctor.

## 2018-05-10 NOTE — ED Provider Notes (Signed)
[facility]-EMERGENCY DEPT Provider Note   CSN: [id number] Arrival date & time: 05/10/18  1035    History   Chief Complaint Chief Complaint  Patient presents with  . Vaginitis  . Rash    HPI Barbara Benton is a 23 y.o. female.     23 year old female presents the ER with multiple complaints.  Patient states that she used her regular face cream on her eyes several days ago and thought that the cream smelled funny and started burning her eyes.  Patient washed her eyes out and applied aloe to the area.  Patient states area is improving but needs treatment.  Also reports a lump to the side of her leg, unsure how long this has been here, denies trauma to the area or changes in the skin otherwise.  Also reports vaginal itching and believes she has a yeast infection, denies discharge.  Patient states she is had multiple yeast infections in the past, states that she is using feminine hygiene wipes and trying to keep her vaginal pH at a 7.0 but maintains a 6.0 and is frustrated by this.  No other complaints today. Review of records, patient was positive for chlamydia in 02/2018- rx sent to pharmacy, also treated for group a strep earlier this month.      Past Medical History:  Diagnosis Date  . ADHD (attention deficit hyperactivity disorder)   . Anxiety   . Bipolar disorder (HCC)   . Depression   . GERD (gastroesophageal reflux disease)   . Hypertension   . ODD (oppositional defiant disorder)     Patient Active Problem List   Diagnosis Date Noted  . Schizophrenia, acute (HCC) 01/14/2018  . 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 NO PAST SURGERIES       OB History   No obstetric history on file.      Home Medications    Prior to Admission medications   Medication Sig Start Date End Date Taking? Authorizing Provider  loratadine (CLARITIN) 10 MG tablet Take 10 mg  by mouth daily as needed for allergies.   Yes [provider]  methocarbamol (ROBAXIN) 500 MG tablet Take 1 tablet (500 mg total) by mouth 2 (two) times daily. 05/05/18  Yes Lorre Nick, MD  omeprazole (PRILOSEC) 20 MG capsule Take 20 mg by mouth daily.   Yes [provider]  OVER THE COUNTER MEDICATION Take 1 tablet by mouth daily.   Yes [provider]  ziprasidone (GEODON) 20 MG capsule Take 1 capsule (20 mg total) by mouth 2 (two) times daily with a meal. Patient taking differently: Take 20 mg by mouth every 6 (six) hours as needed (anxiety).  01/14/18  Yes Starkes-Perry, Juel Burrow, FNP  metroNIDAZOLE (METROGEL VAGINAL) 0.75 % vaginal gel Place 1 Applicatorful vaginally at bedtime for 5 days. 05/10/18 05/15/18  Jeannie Fend, PA-C    Family History Family History  Problem Relation Age of Onset  . Diabetes Maternal Grandmother   . Cancer Paternal Grandfather        type unknown    Social History Social History   Tobacco Use  . Smoking status: Current Every Day Smoker    Packs/day: 0.50    Types: Cigarettes  . Smokeless tobacco: Never Used  Substance Use Topics  . Alcohol use: Yes    Comment: occ  . Drug use: Not Currently    Types: Marijuana, MDMA (  Ecstacy)     Allergies   Patient has no known allergies.   Review of Systems Review of Systems  Constitutional: Negative for fever.  Gastrointestinal: Negative for abdominal pain.  Genitourinary: Negative for dysuria, frequency, pelvic pain, urgency, vaginal discharge and vaginal pain.  Musculoskeletal: Negative for arthralgias and myalgias.  Skin: Negative for rash and wound.  Allergic/Immunologic: Negative for immunocompromised state.  Hematological: Negative for adenopathy.  All other systems reviewed and are negative.    Physical Exam Updated Vital Signs BP (!) 155/93   Pulse 90   Temp 98.4 F (36.9 C)   Resp 16   Ht (P)  (1.575 m)   Wt (P) 99.7 kg   LMP 04/24/2018  (Approximate)   SpO2 100%   BMI (P) 40.20 kg/m   Physical Exam Vitals signs and nursing note reviewed. Exam conducted with a chaperone present.  Constitutional:      General: She is not in acute distress.    Appearance: She is well-developed. She is not diaphoretic.     Comments: No obvious rash to face  HENT:     Head: Normocephalic and atraumatic.     Nose: Nose normal.     Mouth/Throat:     Mouth: Mucous membranes are moist.  Eyes:     Conjunctiva/sclera: Conjunctivae normal.  Neck:     Musculoskeletal: Neck supple. No muscular tenderness.  Pulmonary:     Effort: Pulmonary effort is normal.  Genitourinary:    Vagina: Vaginal discharge present. No erythema or bleeding.     Cervix: No discharge or friability.     Comments: Small amount of clear discharge. Musculoskeletal:     Left upper leg: She exhibits no tenderness, no bony tenderness, no edema and no deformity.       Legs:  Skin:    General: Skin is warm and dry.     Capillary Refill: Capillary refill takes less than 2 seconds.     Findings: No erythema or rash.  Neurological:     Mental Status: She is alert and oriented to person, place, and time.  Psychiatric:        Behavior: Behavior normal.      ED Treatments / Results  Labs (all labs ordered are listed, but only abnormal results are displayed) Labs Reviewed  WET PREP, GENITAL - Abnormal; Notable for the following components:      Result Value   Clue Cells Wet Prep HPF POC PRESENT (*)    WBC, Wet Prep HPF POC MANY (*)    All other components within normal limits  GC/CHLAMYDIA PROBE AMP ([place]) NOT AT [facility]    EKG None  Radiology No results found.  Procedures Procedures (including critical care time)  Medications Ordered in ED Medications - No data to display   Initial Impression / Assessment and Plan / ED Course  I have reviewed the triage vital signs and the nursing notes.  Pertinent labs & imaging results that were available  during my care of the patient were reviewed by me and considered in my medical decision making (see chart for details).  Clinical Course as of May 10 1330  Sat May 10, 2018  [id number] 23 year old female with multiple complaints.  #1 patient has complaint of a rash around her eyes after using face cream.  Patient has been applying aloe to the area.  There does not appear to be any skin changes or noticeable rash.  Advised patient she can apply bacitracin to the orbital  area minimally if needed.  #2 patient complains of vaginal itching, states she is had multiple yeast infections in the past and believes this is same today.  Patient is sexually active, tested positive for chlamydia in February and states she was treated at that time.  Patient was recently prescribed antibiotics for strep throat earlier this month.  On exam patient has trace clear discharge, does not appear to be yeast.  Wet prep is positive for clue cells.  Patient be treated with prescription for MetroGel.  Patient was advised to stop all vaginal cleansing products she has been using and advised these can change the pH of her vagina.  #3 patient is concerned about a lump in her thigh.  On exam patient has a soft nontender mass in the left anterior lateral thigh, consider lipoma.  Does not appear to be infectious.  Advised to follow-up with her PCP.   [LM]    Clinical Course User Index [LM] Jeannie FendMurphy, Jenasis Straley A, PA-C   Final Clinical Impressions(s) / ED Diagnoses   Final diagnoses:  Bacterial vaginosis  Rash  Lump of skin of left lower extremity    ED Discharge Orders         Ordered    metroNIDAZOLE (METROGEL VAGINAL) 0.75 % vaginal gel  Daily at bedtime     05/10/18 1327           Alden HippMurphy, Tyion Boylen A, PA-C 05/10/18 1332    Azalia Bilisampos, Kevin, MD 05/10/18 1459

## 2018-05-10 NOTE — ED Triage Notes (Signed)
Pt reports she has a "chemical burn" to her face from a face cream and fabuloso. RN sees no evidence of chemical burn on patient's face.  Pt also reports she has an untreated yeast infection

## 2018-05-12 LAB — GC/CHLAMYDIA PROBE AMP (~~LOC~~) NOT AT ARMC
Chlamydia: NEGATIVE
Neisseria Gonorrhea: NEGATIVE

## 2018-05-12 NOTE — Telephone Encounter (Signed)
This should not wait another 4-5 weeks, needs to be done now.  thanks

## 2018-05-16 ENCOUNTER — Emergency Department (HOSPITAL_COMMUNITY)
Admission: EM | Admit: 2018-05-16 | Discharge: 2018-05-16 | Disposition: A | Discharge status: Home / Self Care | Payer: Medicaid Other | Attending: Emergency Medicine | Admitting: Emergency Medicine

## 2018-05-16 ENCOUNTER — Other Ambulatory Visit: Payer: Self-pay

## 2018-05-16 ENCOUNTER — Encounter (HOSPITAL_COMMUNITY): Payer: Self-pay | Admitting: Emergency Medicine

## 2018-05-16 ENCOUNTER — Emergency Department (HOSPITAL_COMMUNITY): Payer: Medicaid Other

## 2018-05-16 DIAGNOSIS — F319 Bipolar disorder, unspecified: Secondary | ICD-10-CM | POA: Insufficient documentation

## 2018-05-16 DIAGNOSIS — E669 Obesity, unspecified: Secondary | ICD-10-CM | POA: Insufficient documentation

## 2018-05-16 DIAGNOSIS — R51 Headache: Secondary | ICD-10-CM | POA: Diagnosis not present

## 2018-05-16 DIAGNOSIS — R079 Chest pain, unspecified: Secondary | ICD-10-CM

## 2018-05-16 DIAGNOSIS — I10 Essential (primary) hypertension: Secondary | ICD-10-CM | POA: Diagnosis not present

## 2018-05-16 DIAGNOSIS — F17210 Nicotine dependence, cigarettes, uncomplicated: Secondary | ICD-10-CM | POA: Diagnosis not present

## 2018-05-16 DIAGNOSIS — R0789 Other chest pain: Secondary | ICD-10-CM | POA: Insufficient documentation

## 2018-05-16 DIAGNOSIS — R519 Headache, unspecified: Secondary | ICD-10-CM

## 2018-05-16 DIAGNOSIS — Z79899 Other long term (current) drug therapy: Secondary | ICD-10-CM | POA: Insufficient documentation

## 2018-05-16 DIAGNOSIS — F909 Attention-deficit hyperactivity disorder, unspecified type: Secondary | ICD-10-CM | POA: Insufficient documentation

## 2018-05-16 DIAGNOSIS — Z6841 Body Mass Index (BMI) 40.0 and over, adult: Secondary | ICD-10-CM | POA: Insufficient documentation

## 2018-05-16 LAB — CBC
HCT: 42.9 % (ref 36.0–46.0)
Hemoglobin: 13.8 g/dL (ref 12.0–15.0)
MCH: 29 pg (ref 26.0–34.0)
MCHC: 32.2 g/dL (ref 30.0–36.0)
MCV: 90.1 fL (ref 80.0–100.0)
Platelets: 316 10*3/uL (ref 150–400)
RBC: 4.76 MIL/uL (ref 3.87–5.11)
RDW: 13.7 % (ref 11.5–15.5)
WBC: 7 10*3/uL (ref 4.0–10.5)
nRBC: 0 % (ref 0.0–0.2)

## 2018-05-16 LAB — BASIC METABOLIC PANEL
Anion gap: 13 (ref 5–15)
BUN: 7 mg/dL (ref 6–20)
CO2: 24 mmol/L (ref 22–32)
Calcium: 9.3 mg/dL (ref 8.9–10.3)
Chloride: 103 mmol/L (ref 98–111)
Creatinine, Ser: 0.91 mg/dL (ref 0.44–1.00)
GFR calc Af Amer: >60 mL/min (ref >60)
GFR calc non Af Amer: >60 mL/min (ref >60)
Glucose, Bld: 82 mg/dL (ref 70–99)
Potassium: 3.9 mmol/L (ref 3.5–5.1)
Sodium: 140 mmol/L (ref 135–145)

## 2018-05-16 LAB — I-STAT BETA HCG BLOOD, ED (MC, WL, AP ONLY): I-stat hCG, quantitative: <5 m[IU]/mL (ref <5)

## 2018-05-16 LAB — TROPONIN I: Troponin I: <0.03 ng/mL (ref <0.03)

## 2018-05-16 MED ORDER — ACETAMINOPHEN 325 MG PO TABS
650.0000 mg | ORAL_TABLET | Freq: Once | ORAL | Status: DC
  Filled 2018-05-16: qty 2

## 2018-05-16 MED ORDER — SODIUM CHLORIDE 0.9% FLUSH: 3.0000 mL | Freq: Once | INTRAVENOUS | Status: DC

## 2018-05-16 NOTE — ED Triage Notes (Signed)
Patient arrived from home via [facility] with reports central chest pain. Reports taking some detox medicines that may have caused this problem. She also reports a headache. Per EMS patient reports she normally  takes Geodon for chest pain but did not take it today.  EMS gave 324 ASA and 1 Nitro with no improvement.

## 2018-05-16 NOTE — Discharge Instructions (Signed)
Read instructions below for reasons to return to the Emergency Department. It is recommended that your follow up with your Primary Care Doctor in regards to today's visit. If you do not have a doctor, use the resource guide to help you find one.  ° °Tests performed today include: °An EKG of your heart °A chest x-ray °Cardiac enzymes - a blood test for heart muscle damage °Blood counts and electrolytes ° °Chest Pain (Nonspecific)  °HOME CARE INSTRUCTIONS  °-For the next few days, avoid physical activities that bring on chest pain. Continue physical activities as directed.  °-Do not smoke cigarettes or drink alcohol until your symptoms are gone. If you do smoke, it is time to quit. You may receive instructions and counseling on how to stop smoking. Only take over-the-counter or prescription medicine for pain, discomfort, or fever as directed by your caregiver.  °-Follow your caregiver's suggestions for further testing if your chest pain does not go away.  °-Keep any follow-up appointments you made. If you do not go to an appointment, you could develop lasting (chronic) problems with pain. If there is any problem keeping an appointment, you must call to reschedule.  ° °SEEK MEDICAL CARE IF:  °You think you are having problems from the medicine you are taking. Read your medicine instructions carefully.  °Your chest pain does not go away, even after treatment.  °You develop a rash with blisters on your chest.  ° °SEEK IMMEDIATE MEDICAL CARE IF:  °You have increased chest pain or pain that spreads to your arm, neck, jaw, back, or belly (abdomen).  °You develop shortness of breath, an increasing cough, or you are coughing up blood.  °You have severe back or abdominal pain, feel sick to your stomach (nauseous) or throw up (vomit).  °You develop severe weakness, fainting, or chills.  °You have an oral temperature above 102° F (38.9° C), not controlled by medicine.  ° °THIS IS AN EMERGENCY. Do not wait to see if the pain will  go away. Get medical help at once. Call 911. Do not drive yourself to the hospital. ° ° ° ° °

## 2018-05-16 NOTE — ED Provider Notes (Signed)
[facility] EMERGENCY DEPARTMENT Provider Note   CSN: [id number] Arrival date & time: 05/16/18  1534    History   Chief Complaint Chief Complaint  Patient presents with  . Chest Pain    HPI Barbara Benton is a 23 y.o. female history of ADHD, bipolar depression, hypertension presenting to emergency department today with chief complaint of chest pain x1 day.  Patient states she was sitting outside not doing anything when the pain started.  The pain is located in the center of her chest.  She describes it as an ache that was constant.  The pain did not radiate.  She states the pain lasted 10 minutes and resolved on its own.  She rates the pain 4 out of 10 in severity.  Patient called EMS who was gave patient aspirin and nitro.  She admits to associated headache.  Dates she has a history of headaches and this feels similar.  She denies sudden onset, describes the pain is located on the left side of her head.  It feels like a tension headache.  She denies any visual changes, nausea, vomiting, fever, chills, abdominal pain, back pain, urinary symptoms, diarrhea.  Patient also admits to being under increased stress right now.  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  History provided by patient with additional history obtained from chart review.      Past Medical History:  Diagnosis Date  . ADHD (attention deficit hyperactivity disorder)   . Anxiety   . Bipolar disorder (HCC)   . Depression   . GERD (gastroesophageal reflux disease)   . Hypertension   . ODD (oppositional defiant disorder)     Patient Active Problem List   Diagnosis Date Noted  . Schizophrenia, acute (HCC) 01/14/2018  . 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 NO PAST SURGERIES       OB History   No obstetric history on file.      Home Medications    Prior to Admission medications    Medication Sig Start Date End Date Taking? Authorizing Provider  loratadine (CLARITIN) 10 MG tablet Take 10 mg by mouth daily as needed for allergies.    [provider]  methocarbamol (ROBAXIN) 500 MG tablet Take 1 tablet (500 mg total) by mouth 2 (two) times daily. 05/05/18   Lorre Nick, MD  omeprazole (PRILOSEC) 20 MG capsule Take 20 mg by mouth daily.    [provider]  OVER THE COUNTER MEDICATION Take 1 tablet by mouth daily.    [provider]  ziprasidone (GEODON) 20 MG capsule Take 1 capsule (20 mg total) by mouth 2 (two) times daily with a meal. Patient taking differently: Take 20 mg by mouth every 6 (six) hours as needed (anxiety).  01/14/18   Maryagnes Amos, FNP    Family History Family History  Problem Relation Age of Onset  . Diabetes Maternal Grandmother   . Cancer Paternal Grandfather        type unknown    Social History Social History   Tobacco Use  . Smoking status: Current Every Day Smoker    Packs/day: 0.50    Types: Cigarettes  . Smokeless tobacco: Never Used  Substance Use Topics  . Alcohol use: Yes    Comment: occ  . Drug use: Not Currently    Types: Marijuana, MDMA (Ecstacy)     Allergies  Patient has no known allergies.   Review of Systems Review of Systems  Constitutional: Negative for chills and fever.  HENT: Negative for congestion, ear discharge, ear pain, sinus pressure, sinus pain and sore throat.   Eyes: Negative for pain and redness.  Respiratory: Negative for cough and shortness of breath.   Cardiovascular: Positive for chest pain. Negative for palpitations and leg swelling.  Gastrointestinal: Negative for abdominal pain, constipation, diarrhea, nausea and vomiting.  Genitourinary: Negative for dysuria and hematuria.  Musculoskeletal: Negative for back pain and neck pain.  Skin: Negative for wound.  Neurological: Positive for headaches. Negative for dizziness, weakness and numbness.      Physical Exam Updated Vital Signs BP 133/79   Pulse 76   Temp (!) 97.5 F (36.4 C) (Oral)   Resp (!) 24   Ht 5\' 2"  (1.575 m)   Wt 99.8 kg   LMP 04/24/2018 (Approximate)   SpO2 100%   BMI 40.24 kg/m   Physical Exam Vitals signs and nursing note reviewed.  Constitutional:      Appearance: She is obese.  HENT:     Head: Normocephalic and atraumatic.     Comments: No sinus or temporal tenderness.    Right Ear: Tympanic membrane and external ear normal.     Left Ear: Tympanic membrane and external ear normal.     Nose: Nose normal.     Mouth/Throat:     Mouth: Mucous membranes are moist.     Pharynx: Oropharynx is clear.  Eyes:     General: No scleral icterus.    Extraocular Movements: Extraocular movements intact.     Conjunctiva/sclera: Conjunctivae normal.     Pupils: Pupils are equal, round, and reactive to light.  Neck:     Musculoskeletal: Normal range of motion. No muscular tenderness.     Vascular: No JVD.  Cardiovascular:     Rate and Rhythm: Normal rate and regular rhythm.     Pulses: Normal pulses.          Radial pulses are 2+ on the right side and 2+ on the left side.     Heart sounds: Normal heart sounds.  Pulmonary:     Effort: Pulmonary effort is normal. No tachypnea or respiratory distress.     Breath sounds: Normal breath sounds. No wheezing, rhonchi or rales.  Chest:     Chest wall: No tenderness.  Abdominal:     General: There is no distension.     Palpations: Abdomen is soft.     Tenderness: There is no abdominal tenderness. There is no guarding or rebound.  Musculoskeletal: Normal range of motion.     Right lower leg: No edema.     Left lower leg: No edema.  Skin:    General: Skin is warm and dry.     Capillary Refill: Capillary refill takes less than 2 seconds.  Neurological:     Mental Status: She is alert and oriented to person, place, and time.     Comments: Speech is clear and goal oriented, follows commands CN III-XII intact, no facial  droop Normal strength in upper and lower extremities bilaterally including dorsiflexion and plantar flexion, strong and equal grip strength Sensation normal to light and sharp touch Moves extremities without ataxia, coordination intact Normal finger to nose and rapid alternating movements Normal gait and balance   Psychiatric:        Behavior: Behavior normal.      ED Treatments / Results  Labs (all labs  ordered are listed, but only abnormal results are displayed) Labs Reviewed  BASIC METABOLIC PANEL  CBC  TROPONIN I  I-STAT BETA HCG BLOOD, ED ([facility], [facility], [facility] ONLY)    EKG EKG Interpretation  Date/Time:  Friday May 16 2018 15:54:31 EDT Ventricular Rate:  87 PR Interval:  154 QRS Duration: 78 QT Interval:  348 QTC Calculation: 418 R Axis:   43 Text Interpretation:  Normal sinus rhythm with sinus arrhythmia Normal ECG No STEMI  Confirmed by Alona BeneLong, Joshua [phone]) on 05/16/2018 6:25:05 PM   Radiology Dg Chest 2 View  Result Date: 05/16/2018 CLINICAL DATA:  Headache and chest pain. EXAM: CHEST - 2 VIEW COMPARISON:  None. FINDINGS: The heart size and mediastinal contours are within normal limits. There is no evidence of pulmonary edema, consolidation, pneumothorax, nodule or pleural fluid. The visualized skeletal structures are unremarkable. IMPRESSION: No active cardiopulmonary disease. Electronically Signed   By: Irish LackGlenn  Yamagata M.D.   On: 05/16/2018 16:22    Procedures Procedures (including critical care time)  Medications Ordered in ED Medications  sodium chloride flush (NS) 0.9 % injection 3 mL (has no administration in time range)  acetaminophen (TYLENOL) tablet 650 mg (650 mg Oral Not Given 05/16/18 1818)     Initial Impression / Assessment and Plan / ED Course  I have reviewed the triage vital signs and the nursing notes.  Pertinent labs & imaging results that were available during my care of the patient were reviewed by me and considered in my medical decision making  (see chart for details).   Patient presents to the emergency department with chest pain. Patient nontoxic appearing, in no apparent distress, vitals without significant abnormality. Fairly benign physical exam. DDX: ACS, pulmonary embolism, dissection, pneumothorax, effusion, infiltrate, arrhythmia, anemia, electrolyte derangement, MSK. Evaluation initiated with labs, EKG, and CXR. Patient on cardiac monitor.   Work-up in the ER unremarkable. Labs reviewed, no leukocytosis, anemia, or significant electrolyte abnormality. CXR without infiltrate, effusion, pneumothorax, or fracture/dislocation.   Low risk heart score of 1, EKG without obvious ischemia, delta troponin negative, doubt ACS. Patient is low risk wells, PERC negative, doubt pulmonary embolism. Pain is not a tearing sensation, symmetric pulses, no widening of mediastinum on CXR, doubt dissection.    Pt HA treated with Po tylenol and improved while in ED.  Presentation is like pts typical HA and [facility]CH, Meningitis, or temporal arteritis. Pt is afebrile with no focal neuro deficits, nuchal rigidity, or change in vision.   Patient has appeared hemodynamically stable throughout ER visit and appears safe for discharge with close PCP follow up. I discussed results, treatment plan, need for PCP follow-up, and return precautions with the patient. Provided opportunity for questions, patient confirmed understanding and is in agreement with plan.   Final Clinical Impressions(s) / ED Diagnoses   Final diagnoses:  Chest pain, unspecified type  Acute nonintractable headache, unspecified headache type    ED Discharge Orders    None       Kathyrn Lasslbrizze, Sarrah Fiorenza E, PA-C 05/16/18 1825    Maia PlanLong, Joshua G, MD 05/16/18 2106

## 2018-05-17 ENCOUNTER — Emergency Department (HOSPITAL_COMMUNITY)
Admission: EM | Admit: 2018-05-17 | Discharge: 2018-05-17 | Disposition: A | Discharge status: Home / Self Care | Payer: Medicaid Other | Attending: Emergency Medicine | Admitting: Emergency Medicine

## 2018-05-17 ENCOUNTER — Encounter (HOSPITAL_COMMUNITY): Payer: Self-pay | Admitting: Emergency Medicine

## 2018-05-17 ENCOUNTER — Other Ambulatory Visit: Payer: Self-pay

## 2018-05-17 DIAGNOSIS — I10 Essential (primary) hypertension: Secondary | ICD-10-CM | POA: Diagnosis not present

## 2018-05-17 DIAGNOSIS — F17210 Nicotine dependence, cigarettes, uncomplicated: Secondary | ICD-10-CM | POA: Diagnosis not present

## 2018-05-17 DIAGNOSIS — H04209 Unspecified epiphora, unspecified lacrimal gland: Secondary | ICD-10-CM | POA: Diagnosis present

## 2018-05-17 DIAGNOSIS — Z79899 Other long term (current) drug therapy: Secondary | ICD-10-CM | POA: Insufficient documentation

## 2018-05-17 DIAGNOSIS — H1010 Acute atopic conjunctivitis, unspecified eye: Secondary | ICD-10-CM | POA: Insufficient documentation

## 2018-05-17 MED ORDER — ARTIFICIAL TEARS OPHTHALMIC OINT
TOPICAL_OINTMENT | Freq: Once | OPHTHALMIC | Status: AC
  Administered 2018-05-17: 15:00:00 via OPHTHALMIC
  Filled 2018-05-17: qty 3.5

## 2018-05-17 NOTE — Discharge Instructions (Addendum)
Use the artifical tears and warm  compresses. There is no evidence of infection at this time that would need antibiotic use. Follow up with your doctors in 3 days.

## 2018-05-17 NOTE — ED Triage Notes (Signed)
Pt c/o bilateral eye drainage. No drainage noted on arrival.

## 2018-05-17 NOTE — ED Notes (Signed)
Bed: WTR5 Expected date:  Expected time:  Means of arrival:  Comments: Pt remains in room getting dressed/ ready for d/c.

## 2018-05-17 NOTE — ED Provider Notes (Signed)
[facility]-EMERGENCY DEPT Provider Note   CSN: [id number] Arrival date & time: 05/17/18  1236    History   Chief Complaint Chief Complaint  Patient presents with  . Eye Drainage    HPI Barbara Benton is a 23 y.o. female.     HPI  23 year old woman comes into the ER with chief complaint of eye drainage and discomfort.  Patient reports that for the last several weeks she has been having some discomfort below her eyes.  She also has been having tearing of her eyes and today when she woke up her eyes were closed shut with crusting and yellow discharge.  Patient denies any eye pain or vision change.  She has no URI-like symptoms at this time besides the teary eyes.  Past Medical History:  Diagnosis Date  . ADHD (attention deficit hyperactivity disorder)   . Anxiety   . Bipolar disorder (HCC)   . Depression   . GERD (gastroesophageal reflux disease)   . Hypertension   . ODD (oppositional defiant disorder)     Patient Active Problem List   Diagnosis Date Noted  . Schizophrenia, acute (HCC) 01/14/2018  . 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 NO PAST SURGERIES       OB History   No obstetric history on file.      Home Medications    Prior to Admission medications   Medication Sig Start Date End Date Taking? Authorizing Provider  loratadine (CLARITIN) 10 MG tablet Take 10 mg by mouth daily as needed for allergies.    [provider]  methocarbamol (ROBAXIN) 500 MG tablet Take 1 tablet (500 mg total) by mouth 2 (two) times daily. 05/05/18   Lorre Nick, MD  omeprazole (PRILOSEC) 20 MG capsule Take 20 mg by mouth daily.    [provider]  OVER THE COUNTER MEDICATION Take 1 tablet by mouth daily.    [provider]  ziprasidone (GEODON) 20 MG capsule Take 1 capsule (20 mg total) by mouth 2 (two) times daily with a meal.  Patient taking differently: Take 20 mg by mouth every 6 (six) hours as needed (anxiety).  01/14/18   Maryagnes Amos, FNP    Family History Family History  Problem Relation Age of Onset  . Diabetes Maternal Grandmother   . Cancer Paternal Grandfather        type unknown    Social History Social History   Tobacco Use  . Smoking status: Current Every Day Smoker    Packs/day: 0.50    Types: Cigarettes  . Smokeless tobacco: Never Used  Substance Use Topics  . Alcohol use: Yes    Comment: occ  . Drug use: Not Currently    Types: Marijuana, MDMA (Ecstacy)     Allergies   Patient has no known allergies.   Review of Systems Review of Systems  Constitutional: Positive for activity change.  HENT: Negative for congestion.   Eyes: Positive for discharge and itching. Negative for photophobia, pain, redness and visual disturbance.  Allergic/Immunologic: Negative for environmental allergies.     Physical Exam Updated Vital Signs BP (!) 148/89 (BP Location: Left Arm)   Pulse 80   Temp 98.8 F (37.1 C) (Oral)   Resp 18   LMP 04/24/2018 (Approximate)   SpO2 96%   Physical Exam Vitals signs and nursing note reviewed.  Constitutional:      Appearance: She is  well-developed.  HENT:     Head: Normocephalic and atraumatic.     Nose: No congestion.  Eyes:     General: No scleral icterus.       Right eye: No discharge.        Left eye: No discharge.     Extraocular Movements: Extraocular movements intact.     Conjunctiva/sclera: Conjunctivae normal.     Pupils: Pupils are equal, round, and reactive to light.     Comments: Patient has no evidence of stye, blepharitis, or any other lesions of the eye.  There is no photophobia, scleral icterus or chemosis  Neck:     Musculoskeletal: Normal range of motion and neck supple.  Cardiovascular:     Rate and Rhythm: Normal rate.  Pulmonary:     Effort: Pulmonary effort is normal.  Skin:    General: Skin is warm and dry.   Neurological:     Mental Status: She is alert and oriented to person, place, and time.      ED Treatments / Results  Labs (all labs ordered are listed, but only abnormal results are displayed) Labs Reviewed - No data to display  EKG None  Radiology Dg Chest 2 View  Result Date: 05/16/2018 CLINICAL DATA:  Headache and chest pain. EXAM: CHEST - 2 VIEW COMPARISON:  None. FINDINGS: The heart size and mediastinal contours are within normal limits. There is no evidence of pulmonary edema, consolidation, pneumothorax, nodule or pleural fluid. The visualized skeletal structures are unremarkable. IMPRESSION: No active cardiopulmonary disease. Electronically Signed   By: Irish LackGlenn  Yamagata M.D.   On: 05/16/2018 16:22    Procedures Procedures (including critical care time)  Medications Ordered in ED Medications  artificial tears (LACRILUBE) ophthalmic ointment ( Both Eyes Given 05/17/18 1431)     Initial Impression / Assessment and Plan / ED Course  I have reviewed the triage vital signs and the nursing notes.  Pertinent labs & imaging results that were available during my care of the patient were reviewed by me and considered in my medical decision making (see chart for details).        23 year old comes in a chief complaint of tearing from her eyes.  She is concerned that she might have an infection.  On exam there is no evidence of conjunctivitis that is because of infectious process.  Perhaps it could be a viral etiology.  Patient denies any prior seasonal allergies and does not want any allergy meds right now.  She would prefer antibiotics, but I told her that she does not need antibiotics at this time.  We will give her artificial tears.  Final Clinical Impressions(s) / ED Diagnoses   Final diagnoses:  Seasonal allergic conjunctivitis    ED Discharge Orders    None       Derwood KaplanNanavati, Taher Vannote, MD 05/17/18 1447

## 2018-05-19 ENCOUNTER — Telehealth: Payer: Self-pay | Admitting: Family Medicine

## 2018-05-19 NOTE — Telephone Encounter (Signed)
Patient called on-call service this evening with c/o continued eye discomfort. She is a current patient of Mike Gip, NP at our office. She was recently in the ED on 05/16/2018 and 05/17/2018 for Chest pain and Seasonal Allergic Conjunctivitis. She states that her chest pain has resolved, but she states that she continues to have pain, redness, and discomfort in both eyes. She states that she has been having increased headaches X 1 day now because of eye issues. She states that she was given ointment in ED and told to follow up with her PCP, Mike Gip, NP asap. Patient told to contact office tomorrow morning andmake appointment with me for a Sick Visit for further assessment of her eye issues, as her PCP, Debby Bud, is currently out of the office this week. She is advised to continue Motrin as directed for pain. Patient verbalized understanding and will call office tomorrow morning.    Raliegh Ip,  MSN, FNP-C [facility] [address] [place], [place] [address] [phone]

## 2018-05-20 ENCOUNTER — Other Ambulatory Visit: Payer: Self-pay

## 2018-05-20 ENCOUNTER — Ambulatory Visit (INDEPENDENT_AMBULATORY_CARE_PROVIDER_SITE_OTHER): Payer: Medicaid Other | Admitting: Family Medicine

## 2018-05-20 ENCOUNTER — Encounter: Payer: Self-pay | Admitting: Family Medicine

## 2018-05-20 VITALS — BP 133/74 | HR 65 | Temp 97.7°F | Resp 14 | Wt 224.0 lb

## 2018-05-20 DIAGNOSIS — Z09 Encounter for follow-up examination after completed treatment for conditions other than malignant neoplasm: Secondary | ICD-10-CM

## 2018-05-20 DIAGNOSIS — M549 Dorsalgia, unspecified: Secondary | ICD-10-CM | POA: Diagnosis not present

## 2018-05-20 DIAGNOSIS — G8929 Other chronic pain: Secondary | ICD-10-CM | POA: Diagnosis not present

## 2018-05-20 DIAGNOSIS — H1010 Acute atopic conjunctivitis, unspecified eye: Secondary | ICD-10-CM | POA: Insufficient documentation

## 2018-05-20 MED ORDER — LORATADINE 10 MG PO TABS: 10.0000 mg | ORAL_TABLET | Freq: Every day | ORAL | 6 refills | Status: DC | PRN

## 2018-05-20 MED ORDER — METHOCARBAMOL 500 MG PO TABS: 500.0000 mg | ORAL_TABLET | Freq: Two times a day (BID) | ORAL | 1 refills | Status: DC

## 2018-05-20 MED ORDER — STYE 31.9-57.7 % OP OINT: 4.0000 g | TOPICAL_OINTMENT | Freq: Two times a day (BID) | OPHTHALMIC | 2 refills | Status: DC
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
## 2018-05-20 NOTE — Patient Instructions (Signed)
Artificial Tears eye ointment  What is this medicine?  ARTIFICIAL TEARS (ahr tuh FISH uhl teerz) soothes irritation and discomfort caused by dry eyes.  This medicine may be used for other purposes; ask your health care provider or pharmacist if you have questions.  COMMON BRAND NAME(S): Advanced Eye Relief Night Time, Altalube, Artificial Tears, Eye Lubricant, GenTeal Night-time PM, GenTeal PM, Lacrilube SOP, LubriFresh P.M., Moisture Eyes PM, Puralube, Refresh P.M., Soothe Night Time, Sterilube Nighttime Relief, Stye Sterile Lubricant, Systane Nighttime, Tears Naturale PM  What should I tell my health care provider before I take this medicine?  -change in vision  -eye infection or trauma  -wear contact lenses  -an unusual or allergic reaction to artificial tears, other medicines, foods, dyes, or preservatives  -pregnant or trying to get pregnant  -breast-feeding  How should I use this medicine?  This medicine is only for use in the eye. Do not take by mouth. Follow the directions on the label. Wash hands before and after use. Pull down the lower lid of the affected eye and apply a small amount of ointment, roughly one fourth inch, to the inside of the eyelid. Close the eye gently for a few moments to allow contact with the eye. Wipe away any residue with a clean tissue. Use your medicine at regular intervals. Do not use your medicine more often than directed.  Talk to your pediatrician regarding the use of this medicine in children. While this medicine may be used in children as young as 6 years for selected conditions, precautions do apply.  Overdosage: If you think you have taken too much of this medicine contact a poison control center or emergency room at once.  NOTE: This medicine is only for you. Do not share this medicine with others.  What if I miss a dose?  If you miss a dose, use it as soon as you can. If it is almost time for your next dose, use only that dose. Do not use double or extra doses.  What may  interact with this medicine?  Interactions are not expected. If you are also using eye drops of any type, use the drops roughly 10 minutes before application of the eye ointment so that the eye ointment does not interfere with the action of the drops.  This list may not describe all possible interactions. Give your health care provider a list of all the medicines, herbs, non-prescription drugs, or dietary supplements you use. Also tell them if you smoke, drink alcohol, or use illegal drugs. Some items may interact with your medicine.  What should I watch for while using this medicine?  If you experience eye pain, changes in vision, continued redness or irritation of the eye, or if your eye condition gets worse or lasts longer than 72 hours, discontinue use and consult your health care professional.  To avoid contamination of this product, do not touch the tip of the container to any surface. Do not share this medicine with others. If the product changes color do not use.  If you wear contact lenses, you should remove them before putting the ointment in your eyes. Wait at least 15 minutes after putting the ointment in your eyes before putting your contact lenses back in.  What side effects may I notice from receiving this medicine?  Side effects that you should report to your doctor or health care professional as soon as possible:  -allergic reactions like skin rash, itching or hives, swelling of the face,   lips, or tongue  -change in vision  -eye irritation or redness that gets worse or lasts more than 72 hours  -eye pain  Side effects that usually do not require medical attention (report to your doctor or health care professional if they continue or are bothersome):  -temporary stinging or blurred vision when applying the eye drops  This list may not describe all possible side effects. Call your doctor for medical advice about side effects. You may report side effects to FDA at 1-800-FDA-1088.  Where should I keep  my medicine?  Keep out of the reach of children.  Store at room temperature between 15 and 30 degrees C (59 and 86 degrees F). Do not freeze. Throw away any unused medicine after the expiration date. Once the product is opened, most experts recommend discarding the product after 30 days.  NOTE: This sheet is a summary. It may not cover all possible information. If you have questions about this medicine, talk to your doctor, pharmacist, or health care provider.  © 2019 Elsevier/Gold Standard (2007-07-04 14:24:35)  Stye    A stye, also known as a hordeolum, is a bump that forms on an eyelid. It may look like a pimple next to the eyelash. A stye can form inside the eyelid (internal stye) or outside the eyelid (external stye). A stye can cause redness, swelling, and pain on the eyelid.  Styes are very common. Anyone can get them at any age. They usually occur in just one eye, but you may have more than one in either eye.  What are the causes?  A stye is caused by an infection. The infection is almost always caused by bacteria called Staphylococcus aureus. This is a common type of bacteria that lives on the skin.  An internal stye may result from an infected oil-producing gland inside the eyelid. An external stye may be caused by an infection at the base of the eyelash (hair follicle).  What increases the risk?  You are more likely to develop a stye if:  · You have had a stye before.  · You have any of these conditions:  ? Diabetes.  ? Red, itchy, inflamed eyelids (blepharitis).  ? A skin condition such as seborrheic dermatitis or rosacea.  ? High fat levels in your blood (lipids).  What are the signs or symptoms?  The most common symptom of a stye is eyelid pain. Internal styes are more painful than external styes. Other symptoms may include:  · Painful swelling of your eyelid.  · A scratchy feeling in your eye.  · Tearing and redness of your eye.  · Pus draining from the stye.  How is this diagnosed?  Your health care  provider may be able to diagnose a stye just by examining your eye. The health care provider may also check to make sure:  · You do not have a fever or other signs of a more serious infection.  · The infection has not spread to other parts of your eye or areas around your eye.  How is this treated?  Most styes will clear up in a few days without treatment or with warm compresses applied to the area. You may need to use antibiotic drops or ointment to treat an infection.  In some cases, if your stye does not heal with routine treatment, your health care provider may drain pus from the stye using a thin blade or needle. This may be done if the stye is large, causing a   lot of pain, or affecting your vision.  Follow these instructions at home:  · Take over-the-counter and prescription medicines only as told by your health care provider. This includes eye drops or ointments.  · If you were prescribed an antibiotic medicine, apply or use it as told by your health care provider. Do not stop using the antibiotic even if your condition improves.  · Apply a warm, wet cloth (warm compress) to your eye for 5-10 minutes, 4 times a day.  · Clean the affected eyelid as directed by your health care provider.  · Do not wear contact lenses or eye makeup until your stye has healed.  · Do not try to pop or drain the stye.  · Do not rub your eye.  Contact a health care provider if:  · You have chills or a fever.  · Your stye does not go away after several days.  · Your stye affects your vision.  · Your eyeball becomes swollen, red, or painful.  Get help right away if:  · You have pain when moving your eye around.  Summary  · A stye is a bump that forms on an eyelid. It may look like a pimple next to the eyelash.  · A stye can form inside the eyelid (internal stye) or outside the eyelid (external stye). A stye can cause redness, swelling, and pain on the eyelid.  · Your health care provider may be able to diagnose a stye just by  examining your eye.  · Apply a warm, wet cloth (warm compress) to your eye for 5-10 minutes, 4 times a day.  This information is not intended to replace advice given to you by your health care provider. Make sure you discuss any questions you have with your health care provider.  Document Released: 10/11/2004 Document Revised: 09/13/2016 Document Reviewed: 09/13/2016  Elsevier Interactive Patient Education © 2019 Elsevier Inc.

## 2018-05-21 ENCOUNTER — Other Ambulatory Visit: Payer: Self-pay

## 2018-05-21 ENCOUNTER — Emergency Department (HOSPITAL_COMMUNITY)
Admission: EM | Admit: 2018-05-21 | Discharge: 2018-05-21 | Disposition: A | Discharge status: Home / Self Care | Payer: Medicaid Other | Attending: Emergency Medicine | Admitting: Emergency Medicine

## 2018-05-21 ENCOUNTER — Encounter (HOSPITAL_COMMUNITY): Payer: Self-pay

## 2018-05-21 DIAGNOSIS — F17210 Nicotine dependence, cigarettes, uncomplicated: Secondary | ICD-10-CM | POA: Insufficient documentation

## 2018-05-21 DIAGNOSIS — I10 Essential (primary) hypertension: Secondary | ICD-10-CM | POA: Insufficient documentation

## 2018-05-21 DIAGNOSIS — H1013 Acute atopic conjunctivitis, bilateral: Secondary | ICD-10-CM | POA: Diagnosis present

## 2018-05-21 DIAGNOSIS — Z79899 Other long term (current) drug therapy: Secondary | ICD-10-CM | POA: Diagnosis not present

## 2018-05-21 DIAGNOSIS — Z760 Encounter for issue of repeat prescription: Secondary | ICD-10-CM | POA: Diagnosis not present

## 2018-05-21 MED ORDER — ERYTHROMYCIN 5 MG/GM OP OINT: TOPICAL_OINTMENT | OPHTHALMIC | 0 refills | Status: DC

## 2018-05-21 NOTE — ED Triage Notes (Addendum)
Pt states that she wants meds for her eyes that have bene itchy. Pt states that she was given rx by PCP for claritin, but has had headache and allergy symptoms have not been relieved. Pt states that she needs rx for "doxy-" something. Pt also requests meds for migraines. Pt states she has "clinacin" for a dx with her eyes?

## 2018-05-21 NOTE — ED Notes (Signed)
Discharge paperwork reviewed with pt, pt expressed desire for "prescription for Tylenol."  Pt informed that Tylenol can be purchased over the counter and a prescription is not needed.  Pt verbalized understanding.   Pt ambulatory at discharge.

## 2018-05-21 NOTE — ED Provider Notes (Signed)
[facility]-EMERGENCY DEPT Provider Note   CSN: [id number] Arrival date & time: 05/21/18  1340    History   Chief Complaint Chief Complaint  Patient presents with  . Eye Drainage  . Medication Refill    HPI Barbara Benton is a 23 y.o. female.     The history is provided by the patient.  Eye Problem  Location:  Both eyes Quality: itchy. Severity:  Mild Onset quality:  Gradual Timing:  Intermittent Progression:  Waxing and waning Chronicity:  Recurrent Context comment:  Allergies Relieved by:  Eye drops Worsened by:  Nothing Associated symptoms: itching   Associated symptoms: no blurred vision, no discharge, no photophobia and no redness     Past Medical History:  Diagnosis Date  . ADHD (attention deficit hyperactivity disorder)   . Anxiety   . Bipolar disorder (HCC)   . Depression   . GERD (gastroesophageal reflux disease)   . Hypertension   . ODD (oppositional defiant disorder)     Patient Active Problem List   Diagnosis Date Noted  . Seasonal allergic conjunctivitis 05/20/2018  . Chronic back pain 05/20/2018  . Schizophrenia, acute (HCC) 01/14/2018  . 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 NO PAST SURGERIES       OB History   No obstetric history on file.      Home Medications    Prior to Admission medications   Medication Sig Start Date End Date Taking? Authorizing Provider  erythromycin ophthalmic ointment Place a 1/2 inch ribbon of ointment into the lower eyelid bilaterally for 5 days 05/21/18   Virgina Norfolk, DO  loratadine (CLARITIN) 10 MG tablet Take 1 tablet (10 mg total) by mouth daily as needed for allergies. 05/20/18   Kallie Locks, FNP  methocarbamol (ROBAXIN) 500 MG tablet Take 1 tablet (500 mg total) by mouth 2 (two) times daily. 05/20/18   Kallie Locks, FNP  omeprazole (PRILOSEC) 20 MG capsule Take 20 mg by mouth  daily.    [provider]  OVER THE COUNTER MEDICATION Take 1 tablet by mouth daily.    [provider]  Rayburn Ma Oil (STYE) 31.9-57.7 % OINT Apply 4 application to eye 2 (two) times a day. 05/20/18   Kallie Locks, FNP  ziprasidone (GEODON) 20 MG capsule Take 1 capsule (20 mg total) by mouth 2 (two) times daily with a meal. Patient taking differently: Take 20 mg by mouth every 6 (six) hours as needed (anxiety).  01/14/18   Maryagnes Amos, FNP    Family History Family History  Problem Relation Age of Onset  . Diabetes Maternal Grandmother   . Cancer Paternal Grandfather        type unknown    Social History Social History   Tobacco Use  . Smoking status: Current Every Day Smoker    Packs/day: 0.50    Types: Cigarettes  . Smokeless tobacco: Never Used  Substance Use Topics  . Alcohol use: Yes    Comment: occ  . Drug use: Not Currently    Types: Marijuana, MDMA (Ecstacy)     Allergies   Patient has no known allergies.   Review of Systems Review of Systems  HENT: Positive for sinus pressure. Negative for congestion, sore throat and trouble swallowing.   Eyes: Positive for itching. Negative for blurred vision, photophobia, pain, discharge, redness and visual disturbance.     Physical Exam  Updated Vital Signs  ED Triage Vitals  Enc Vitals Group     BP 05/21/18 1353 (!) 151/105     Pulse Rate 05/21/18 1353 75     Resp 05/21/18 1353 14     Temp 05/21/18 1353 98.1 F (36.7 C)     Temp Source 05/21/18 1353 Oral     SpO2 05/21/18 1353 99 %     Weight 05/21/18 1354 222 lb 10.6 oz (101 kg)     Height 05/21/18 1354  (1.575 m)     Head Circumference --      Peak Flow --      Pain Score 05/21/18 1354 8     Pain Loc --      Pain Edu? --      Excl. in GC? --     Physical Exam Vitals signs and nursing note reviewed.  Constitutional:      General: She is not in acute distress.    Appearance: She is well-developed. She  is not ill-appearing.  HENT:     Head: Normocephalic and atraumatic.     Nose: Nose normal.     Mouth/Throat:     Mouth: Mucous membranes are moist.  Eyes:     General:        Right eye: No discharge.        Left eye: No discharge.     Extraocular Movements: Extraocular movements intact.     Conjunctiva/sclera: Conjunctivae normal.     Pupils: Pupils are equal, round, and reactive to light.  Neck:     Musculoskeletal: Neck supple.  Cardiovascular:     Pulses: Normal pulses.     Heart sounds: Murmur present.  Abdominal:     Palpations: Abdomen is soft.  Skin:    General: Skin is warm and dry.  Neurological:     Mental Status: She is alert.      ED Treatments / Results  Labs (all labs ordered are listed, but only abnormal results are displayed) Labs Reviewed - No data to display  EKG None  Radiology No results found.  Procedures Procedures (including critical care time)  Medications Ordered in ED Medications - No data to display   Initial Impression / Assessment and Plan / ED Course  I have reviewed the triage vital signs and the nursing notes.  Pertinent labs & imaging results that were available during my care of the patient were reviewed by me and considered in my medical decision making (see chart for details).     Barbara Benton is a 23 year old female who presents to the ED with itchy eyes.  She has symptoms consistent with allergic conjunctivitis.  She has been on loratadine and eyedrops with some improvement.  However, patient is insistent that she get a prescription for erythromycin eyedrops.  Overall I gave her reassurance that she likely has allergic conjunctivitis.  She has allergic shiners.  Has history of the same.  Told her to give her treatment with eyedrops a few more days before filling prescription for erythromycin drops.  Discharged in good condition.  This chart was dictated using voice recognition software.  Despite best efforts to proofread,   errors can occur which can change the documentation meaning.    Final Clinical Impressions(s) / ED Diagnoses   Final diagnoses:  Allergic conjunctivitis of both eyes    ED Discharge Orders         Ordered    erythromycin ophthalmic ointment     05/21/18  1415           Virgina NorfolkCuratolo, [place]fle[place] Capers, DO 05/21/18 1418

## 2018-05-22 NOTE — Telephone Encounter (Signed)
Message sent to provider 

## 2018-05-27 ENCOUNTER — Telehealth: Payer: Self-pay | Admitting: *Deleted

## 2018-05-27 NOTE — Telephone Encounter (Signed)
Covid-19 travel screening questions  Have you traveled in the last 14 days? If yes where? No Do you now or have you had a fever in the last 14 days? No Do you have any respiratory symptoms of shortness of breath or cough now or in the last 14 days? No Do you have any family members or close contacts with diagnosed or suspected Covid-19? No      

## 2018-05-28 ENCOUNTER — Inpatient Hospital Stay: Admission: RE | Admit: 2018-05-28 | Payer: Medicaid Other | Source: Ambulatory Visit

## 2018-05-28 ENCOUNTER — Encounter (HOSPITAL_COMMUNITY): Payer: Self-pay

## 2018-05-28 ENCOUNTER — Other Ambulatory Visit: Payer: Self-pay

## 2018-05-28 ENCOUNTER — Emergency Department (HOSPITAL_COMMUNITY)
Admission: EM | Admit: 2018-05-28 | Discharge: 2018-05-28 | Disposition: A | Discharge status: Home / Self Care | Payer: Medicaid Other | Attending: Emergency Medicine | Admitting: Emergency Medicine

## 2018-05-28 DIAGNOSIS — I10 Essential (primary) hypertension: Secondary | ICD-10-CM | POA: Diagnosis not present

## 2018-05-28 DIAGNOSIS — Z79899 Other long term (current) drug therapy: Secondary | ICD-10-CM | POA: Insufficient documentation

## 2018-05-28 DIAGNOSIS — F17210 Nicotine dependence, cigarettes, uncomplicated: Secondary | ICD-10-CM | POA: Diagnosis not present

## 2018-05-28 DIAGNOSIS — R52 Pain, unspecified: Secondary | ICD-10-CM | POA: Diagnosis not present

## 2018-05-28 DIAGNOSIS — F909 Attention-deficit hyperactivity disorder, unspecified type: Secondary | ICD-10-CM | POA: Insufficient documentation

## 2018-05-28 MED ORDER — FLUCONAZOLE 150 MG PO TABS
150.0000 mg | ORAL_TABLET | Freq: Once | ORAL | Status: AC
  Administered 2018-05-28: 150 mg via ORAL
  Filled 2018-05-28: qty 1

## 2018-05-28 MED ORDER — IBUPROFEN 800 MG PO TABS
800.0000 mg | ORAL_TABLET | Freq: Once | ORAL | Status: AC
  Administered 2018-05-28: 03:00:00 800 mg via ORAL
  Filled 2018-05-28: qty 1

## 2018-05-28 NOTE — ED Triage Notes (Signed)
Pt called EMS because she was out of her muscle relaxers and she complains of arm and leg pain

## 2018-05-28 NOTE — ED Notes (Signed)
Bed: YS16 Expected date:  Expected time:  Means of arrival:  Comments: EMS 23 yr old arm and leg pain

## 2018-05-28 NOTE — ED Notes (Signed)
Patient complaining of lower back pain. Back pain is from a strain. She states she was taking muscle relaxer. She states she was taking one in the morning and one in the afternoon. Patient states she has not had a recent injury and that she is homeless.

## 2018-05-28 NOTE — ED Provider Notes (Signed)
[facility]-EMERGENCY DEPT Provider Note   CSN: [id number] Arrival date & time: 05/28/18  0154    History   Chief Complaint Chief Complaint  Patient presents with  . arm and leg pain    HPI Barbara Benton is a 23 y.o. female.     Patient presents to the ED with a chief complaint of body aches.  She states that her body is sore and she would like an ibuprofen.  She denies any fever, chills, cough, abdominal pain.  She states that she is being treated for a yeast infection with monostat, but asks if she can get a tablet instead.  She denies any other symptoms.  The history is provided by the patient. No language interpreter was used.    Past Medical History:  Diagnosis Date  . ADHD (attention deficit hyperactivity disorder)   . Anxiety   . Bipolar disorder (HCC)   . Depression   . GERD (gastroesophageal reflux disease)   . Hypertension   . ODD (oppositional defiant disorder)     Patient Active Problem List   Diagnosis Date Noted  . Seasonal allergic conjunctivitis 05/20/2018  . Chronic back pain 05/20/2018  . Schizophrenia, acute (HCC) 01/14/2018  . 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 NO PAST SURGERIES       OB History   No obstetric history on file.      Home Medications    Prior to Admission medications   Medication Sig Start Date End Date Taking? Authorizing Provider  erythromycin ophthalmic ointment Place a 1/2 inch ribbon of ointment into the lower eyelid bilaterally for 5 days 05/21/18   Virgina Norfolkuratolo, Adam, DO  loratadine (CLARITIN) 10 MG tablet Take 1 tablet (10 mg total) by mouth daily as needed for allergies. 05/20/18   Kallie LocksStroud, Natalie M, FNP  methocarbamol (ROBAXIN) 500 MG tablet Take 1 tablet (500 mg total) by mouth 2 (two) times daily. 05/20/18   Kallie LocksStroud, Natalie M, FNP  omeprazole (PRILOSEC) 20 MG capsule Take 20 mg by mouth daily.     [provider]  OVER THE COUNTER MEDICATION Take 1 tablet by mouth daily.    [provider]  Rayburn MaWhite Petrolatum-Mineral Oil (STYE) 31.9-57.7 % OINT Apply 4 application to eye 2 (two) times a day. 05/20/18   Kallie LocksStroud, Natalie M, FNP  ziprasidone (GEODON) 20 MG capsule Take 1 capsule (20 mg total) by mouth 2 (two) times daily with a meal. Patient taking differently: Take 20 mg by mouth every 6 (six) hours as needed (anxiety).  01/14/18   Maryagnes AmosStarkes-Perry, Takia S, FNP    Family History Family History  Problem Relation Age of Onset  . Diabetes Maternal Grandmother   . Cancer Paternal Grandfather        type unknown    Social History Social History   Tobacco Use  . Smoking status: Current Every Day Smoker    Packs/day: 0.50    Types: Cigarettes  . Smokeless tobacco: Never Used  Substance Use Topics  . Alcohol use: Yes    Comment: occ  . Drug use: Not Currently    Types: Marijuana, MDMA (Ecstacy)     Allergies   Patient has no known allergies.   Review of Systems Review of Systems  All other systems reviewed and are negative.    Physical Exam Updated Vital Signs BP (!) 143/84 (BP Location: Left Arm)   Pulse 74  Temp (!) 97.4 F (36.3 C) (Oral)   Resp 18   Ht 5\' 2"  (1.575 m)   Wt 99.8 kg   SpO2 100%   BMI 40.24 kg/m   Physical Exam Vitals signs and nursing note reviewed.  Constitutional:      General: She is not in acute distress.    Appearance: She is well-developed.  HENT:     Head: Normocephalic and atraumatic.  Eyes:     Conjunctiva/sclera: Conjunctivae normal.  Neck:     Musculoskeletal: Neck supple.  Cardiovascular:     Rate and Rhythm: Normal rate and regular rhythm.     Heart sounds: No murmur.  Pulmonary:     Effort: Pulmonary effort is normal. No respiratory distress.     Breath sounds: Normal breath sounds.  Abdominal:     Palpations: Abdomen is soft.     Tenderness: There is no abdominal tenderness.  Musculoskeletal: Normal  range of motion.  Skin:    General: Skin is warm and dry.  Neurological:     Mental Status: She is alert and oriented to person, place, and time.  Psychiatric:        Mood and Affect: Mood normal.        Behavior: Behavior normal.        Thought Content: Thought content normal.        Judgment: Judgment normal.      ED Treatments / Results  Labs (all labs ordered are listed, but only abnormal results are displayed) Labs Reviewed - No data to display  EKG None  Radiology No results found.  Procedures Procedures (including critical care time)  Medications Ordered in ED Medications  ibuprofen (ADVIL) tablet 800 mg (has no administration in time range)  fluconazole (DIFLUCAN) tablet 150 mg (has no administration in time range)     Initial Impression / Assessment and Plan / ED Course  I have reviewed the triage vital signs and the nursing notes.  Pertinent labs & imaging results that were available during my care of the patient were reviewed by me and considered in my medical decision making (see chart for details).        Patient here requesting an ibuprofen and diflucan.  VSS.  NAD.  Well appearing.  MSE complete.  Final Clinical Impressions(s) / ED Diagnoses   Final diagnoses:  Body aches    ED Discharge Orders    None       Roxy Horseman, PA-C 05/28/18 0230    Derwood Kaplan, MD 05/28/18 [phone]

## 2018-05-29 ENCOUNTER — Emergency Department (HOSPITAL_COMMUNITY)
Admission: EM | Admit: 2018-05-29 | Discharge: 2018-05-29 | Disposition: A | Discharge status: Home / Self Care | Payer: Medicaid Other | Attending: Emergency Medicine | Admitting: Emergency Medicine

## 2018-05-29 ENCOUNTER — Encounter (HOSPITAL_COMMUNITY): Payer: Self-pay

## 2018-05-29 DIAGNOSIS — I10 Essential (primary) hypertension: Secondary | ICD-10-CM | POA: Insufficient documentation

## 2018-05-29 DIAGNOSIS — R438 Other disturbances of smell and taste: Secondary | ICD-10-CM

## 2018-05-29 DIAGNOSIS — F17210 Nicotine dependence, cigarettes, uncomplicated: Secondary | ICD-10-CM | POA: Insufficient documentation

## 2018-05-29 DIAGNOSIS — Z79899 Other long term (current) drug therapy: Secondary | ICD-10-CM | POA: Diagnosis not present

## 2018-05-29 MED ORDER — CHLORHEXIDINE GLUCONATE 0.12% ORAL RINSE (MEDLINE KIT): 15.0000 mL | Freq: Two times a day (BID) | OROMUCOSAL | 0 refills | Status: DC

## 2018-05-29 NOTE — ED Notes (Signed)
Bed: WTR6 Expected date:  Expected time:  Means of arrival:  Comments: 

## 2018-05-29 NOTE — ED Provider Notes (Signed)
[facility] DEPT Provider Note   CSN: [id number] Arrival date & time: 05/29/18  1146    History   Chief Complaint Chief Complaint  Patient presents with  . feeling bad    HPI Barbara Benton is a 23 y.o. female with a past medical history of GERD, hypertension, ADHD, bipolar disorder presenting to ED with a bad taste in her mouth.  States that she smoked a cigarette that belonged to someone else.  She is concerned that she has the taste of dog saliva in her mouth and "some bacteria."  She denies any other complaints.     HPI  Past Medical History:  Diagnosis Date  . ADHD (attention deficit hyperactivity disorder)   . Anxiety   . Bipolar disorder ([place])   . Depression   . GERD (gastroesophageal reflux disease)   . Hypertension   . ODD (oppositional defiant disorder)     Patient Active Problem List   Diagnosis Date Noted  . Seasonal allergic conjunctivitis 05/20/2018  . Chronic back pain 05/20/2018  . Schizophrenia, acute ([place]) 01/14/2018  . 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 NO PAST SURGERIES       OB History   No obstetric history on file.      Home Medications    Prior to Admission medications   Medication Sig Start Date End Date Taking? Authorizing Provider  chlorhexidine gluconate, MEDLINE KIT, (PERIDEX) 0.12 % solution Use as directed 15 mLs in the mouth or throat 2 (two) times daily. 05/29/18   Britnee Mcdevitt, Nicanor Alcon, PA-C  erythromycin ophthalmic ointment Place a 1/2 inch ribbon of ointment into the lower eyelid bilaterally for 5 days 05/21/18   Lennice Sites, DO  loratadine (CLARITIN) 10 MG tablet Take 1 tablet (10 mg total) by mouth daily as needed for allergies. 05/20/18   Azzie Glatter, FNP  methocarbamol (ROBAXIN) 500 MG tablet Take 1 tablet (500 mg total) by mouth 2 (two) times daily. 05/20/18   Azzie Glatter, FNP  omeprazole  (PRILOSEC) 20 MG capsule Take 20 mg by mouth daily.    [provider]  OVER THE COUNTER MEDICATION Take 1 tablet by mouth daily.    [provider]  Jay Schlichter Oil (STYE) 31.9-57.7 % OINT Apply 4 application to eye 2 (two) times a day. 05/20/18   Azzie Glatter, FNP  ziprasidone (GEODON) 20 MG capsule Take 1 capsule (20 mg total) by mouth 2 (two) times daily with a meal. Patient taking differently: Take 20 mg by mouth every 6 (six) hours as needed (anxiety).  01/14/18   Suella Broad, FNP    Family History Family History  Problem Relation Age of Onset  . Diabetes Maternal Grandmother   . Cancer Paternal Grandfather        type unknown    Social History Social History   Tobacco Use  . Smoking status: Current Every Day Smoker    Packs/day: 0.50    Types: Cigarettes  . Smokeless tobacco: Never Used  Substance Use Topics  . Alcohol use: Yes    Comment: occ  . Drug use: Not Currently    Types: Marijuana, MDMA (Ecstacy)     Allergies   Patient has no known allergies.   Review of Systems Review of Systems  Constitutional: Negative for chills and fever.  HENT: Negative for sinus pressure and sore throat.   Gastrointestinal: Negative for nausea.  Physical Exam Updated Vital Signs BP (!) 142/90 (BP Location: Left Arm)   Pulse 71   Temp 98.1 F (36.7 C) (Oral)   Resp 17   SpO2 100%   Physical Exam Vitals signs and nursing note reviewed.  Constitutional:      General: She is not in acute distress.    Appearance: She is well-developed. She is not diaphoretic.  HENT:     Head: Normocephalic and atraumatic.     Mouth/Throat:     Pharynx: Oropharynx is clear. Uvula midline.     Comments: Patient does not appear to be in acute distress. No trismus or drooling present. No pooling of secretions. Patient is tolerating secretions and is not in respiratory distress. No neck pain or tenderness to palpation of the neck. Full active  and passive range of motion of the neck. No evidence of RPA or PTA. Eyes:     General: No scleral icterus.    Conjunctiva/sclera: Conjunctivae normal.  Neck:     Musculoskeletal: Normal range of motion.  Pulmonary:     Effort: Pulmonary effort is normal. No respiratory distress.  Skin:    Findings: No rash.  Neurological:     Mental Status: She is alert.      ED Treatments / Results  Labs (all labs ordered are listed, but only abnormal results are displayed) Labs Reviewed - No data to display  EKG None  Radiology No results found.  Procedures Procedures (including critical care time)  Medications Ordered in ED Medications - No data to display   Initial Impression / Assessment and Plan / ED Course  I have reviewed the triage vital signs and the nursing notes.  Pertinent labs & imaging results that were available during my care of the patient were reviewed by me and considered in my medical decision making (see chart for details).        23 year old female presents to ED for bad taste in her mouth.  She is concerned that she smokes some and also a cigarette and now has a taste of dog saliva in her mouth.  Denies any other complaints.  I see no abnormalities noted on exam, no signs of thrush, RPA or PTA.  Will give Peridex for mouthwash and PCP follow-up.  Patient is hemodynamically stable, in NAD, and able to ambulate in the ED. Evaluation does not show pathology that would require ongoing emergent intervention or inpatient treatment. I explained the diagnosis to the patient. Pain has been managed and has no complaints prior to discharge. Patient is comfortable with above plan and is stable for discharge at this time. All questions were answered prior to disposition. Strict return precautions for returning to the ED were discussed. Encouraged follow up with PCP.   An After Visit Summary was printed and given to the patient.   Portions of this note were generated with  Lobbyist. Dictation errors may occur despite best attempts at proofreading.   Final Clinical Impressions(s) / ED Diagnoses   Final diagnoses:  Bad taste in mouth    ED Discharge Orders         Ordered    chlorhexidine gluconate, MEDLINE KIT, (PERIDEX) 0.12 % solution  2 times daily     05/29/18 [employer], Klyde Banka, PA-C 05/29/18 [place], [place], DO 05/29/18 1255

## 2018-05-29 NOTE — ED Triage Notes (Signed)
Per EMS-states she smoked a cigarette she got from a guy-states she thinks it had bacteria in it because now she doesn't feel well-has given plasma today

## 2018-06-18 ENCOUNTER — Other Ambulatory Visit: Payer: Self-pay

## 2018-06-18 ENCOUNTER — Encounter (HOSPITAL_COMMUNITY): Payer: Self-pay | Admitting: Emergency Medicine

## 2018-06-18 ENCOUNTER — Emergency Department (HOSPITAL_COMMUNITY)
Admission: EM | Admit: 2018-06-18 | Discharge: 2018-06-18 | Disposition: A | Discharge status: Home / Self Care | Payer: Medicaid Other | Attending: Emergency Medicine | Admitting: Emergency Medicine

## 2018-06-18 ENCOUNTER — Telehealth: Payer: Self-pay

## 2018-06-18 DIAGNOSIS — F17210 Nicotine dependence, cigarettes, uncomplicated: Secondary | ICD-10-CM | POA: Insufficient documentation

## 2018-06-18 DIAGNOSIS — I10 Essential (primary) hypertension: Secondary | ICD-10-CM | POA: Diagnosis not present

## 2018-06-18 DIAGNOSIS — N898 Other specified noninflammatory disorders of vagina: Secondary | ICD-10-CM | POA: Diagnosis not present

## 2018-06-18 DIAGNOSIS — H5789 Other specified disorders of eye and adnexa: Secondary | ICD-10-CM | POA: Diagnosis not present

## 2018-06-18 DIAGNOSIS — Z79899 Other long term (current) drug therapy: Secondary | ICD-10-CM | POA: Insufficient documentation

## 2018-06-18 DIAGNOSIS — R109 Unspecified abdominal pain: Secondary | ICD-10-CM | POA: Diagnosis not present

## 2018-06-18 DIAGNOSIS — R51 Headache: Secondary | ICD-10-CM | POA: Insufficient documentation

## 2018-06-18 LAB — WET PREP, GENITAL
Clue Cells Wet Prep HPF POC: NONE SEEN
Sperm: NONE SEEN
Trich, Wet Prep: NONE SEEN
WBC, Wet Prep HPF POC: NONE SEEN
Yeast Wet Prep HPF POC: NONE SEEN

## 2018-06-18 MED ORDER — ERYTHROMYCIN 5 MG/GM OP OINT: TOPICAL_OINTMENT | OPHTHALMIC | 0 refills | Status: DC

## 2018-06-18 MED ORDER — PANTOPRAZOLE SODIUM 40 MG PO TBEC
40.0000 mg | DELAYED_RELEASE_TABLET | Freq: Every day | ORAL | Status: DC
  Administered 2018-06-18: 40 mg via ORAL
  Filled 2018-06-18: qty 1

## 2018-06-18 MED ORDER — IBUPROFEN 200 MG PO TABS
600.0000 mg | ORAL_TABLET | Freq: Once | ORAL | Status: AC
  Administered 2018-06-18: 600 mg via ORAL
  Filled 2018-06-18: qty 3

## 2018-06-18 NOTE — ED Triage Notes (Signed)
Per EMS complaint of abdominal and headache onset an hour ago; denies v/d.

## 2018-06-18 NOTE — ED Notes (Signed)
Pt is alert and oriented x 4 and is verbally responsive. Pt reports 8/10 generalized abdominal pain.

## 2018-06-18 NOTE — Telephone Encounter (Signed)
Called and patient could not talk. She states she will call back. Thanks!

## 2018-06-18 NOTE — ED Provider Notes (Signed)
  Physical Exam  BP (!) 142/81 (BP Location: Right Arm)   Pulse 78   Temp 98.3 F (36.8 C) (Oral)   Resp 18   SpO2 100%   Physical Exam Constitutional:      Appearance: She is well-developed. She is obese. She is not ill-appearing.  Genitourinary:    Comments: No inguinal lymphadenopathy or inguinal hernia noted. Normal external genitalia. No pain with speculum insertion. Closed cervical os with normal appearance - no rash or lesions. No significant discharge or bleeding noted from cervix or in vaginal vault. On bimanual examination no adnexal tenderness or cervical motion tenderness. Chaperone present during exam.   Neurological:     Mental Status: She is alert.  Psychiatric:        Mood and Affect: Mood normal.        Behavior: Behavior normal.     ED Course/Procedures     Procedures  MDM  Pelvic exam performed by me due to pt request for female provider. Exam is unremarkable. Wet prep and G&C swab collected and sent. Please see Dr. Brandy Hale note for visit encounter       Barbara Benton, Barbara Benton 06/18/18 1219    Derwood Kaplan, MD 06/19/18 [phone]

## 2018-06-18 NOTE — ED Provider Notes (Signed)
[facility] DEPT Provider Note   CSN: [id number] Arrival date & time: 06/18/18  0757    History   Chief Complaint Chief Complaint  Patient presents with  . Abdominal Pain  . Headache    HPI Barbara Benton is a 23 y.o. female.     HPI  23 year old female comes in with multiple complaints.  She has abdominal pain, headache, eye drainage and also vaginal discharge.  Patient states that abdominal pain has been off and on for several days.  She is following GI and is supposed to get MRI. She has had drainage from her eye for the last few days.  It got better with erythromycin ointment, but it has now returned.  She has no associated vision change. Patient also has intermittent headaches and finally she is complaining of vaginal discharge with purple/bloody discharge.  No UTI-like symptoms.  She comes to the ER frequently for nonspecific and nonemergent complaints.  Past Medical History:  Diagnosis Date  . ADHD (attention deficit hyperactivity disorder)   . Anxiety   . Bipolar disorder ([place])   . Depression   . GERD (gastroesophageal reflux disease)   . Hypertension   . ODD (oppositional defiant disorder)     Patient Active Problem List   Diagnosis Date Noted  . Seasonal allergic conjunctivitis 05/20/2018  . Chronic back pain 05/20/2018  . Schizophrenia, acute ([place]) 01/14/2018  . 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 NO PAST SURGERIES       OB History   No obstetric history on file.      Home Medications    Prior to Admission medications   Medication Sig Start Date End Date Taking? Authorizing Provider  loratadine (CLARITIN) 10 MG tablet Take 1 tablet (10 mg total) by mouth daily as needed for allergies. 05/20/18  Yes Azzie Glatter, FNP  omeprazole (PRILOSEC) 20 MG capsule Take 20 mg by mouth daily.   Yes [provider]   OVER THE COUNTER MEDICATION Take 1 tablet by mouth daily.   Yes [provider]  ziprasidone (GEODON) 20 MG capsule Take 1 capsule (20 mg total) by mouth 2 (two) times daily with a meal. Patient taking differently: Take 20 mg by mouth every 6 (six) hours as needed (anxiety).  01/14/18  Yes Starkes-Perry, Gayland Curry, FNP  chlorhexidine gluconate, MEDLINE KIT, (PERIDEX) 0.12 % solution Use as directed 15 mLs in the mouth or throat 2 (two) times daily. Patient not taking: Reported on 06/18/2018 05/29/18   Delia Heady, PA-C  erythromycin ophthalmic ointment Place a 1/2 inch ribbon of ointment into the lower eyelid bilaterally for 5 days 06/18/18   Varney Biles, MD  methocarbamol (ROBAXIN) 500 MG tablet Take 1 tablet (500 mg total) by mouth 2 (two) times daily. Patient not taking: Reported on 06/18/2018 05/20/18   Azzie Glatter, FNP  White Petrolatum-Mineral Oil (STYE) 31.9-57.7 % OINT Apply 4 application to eye 2 (two) times a day. Patient not taking: Reported on 06/18/2018 05/20/18   Azzie Glatter, FNP    Family History Family History  Problem Relation Age of Onset  . Diabetes Maternal Grandmother   . Cancer Paternal Grandfather        type unknown    Social History Social History   Tobacco Use  . Smoking status: Current Every Day Smoker    Packs/day: 0.50    Types: Cigarettes  . Smokeless  tobacco: Never Used  Substance Use Topics  . Alcohol use: Yes    Comment: occ  . Drug use: Not Currently    Types: Marijuana, MDMA (Ecstacy)     Allergies   Patient has no known allergies.   Review of Systems Review of Systems  Constitutional: Positive for activity change.  Eyes: Positive for discharge. Negative for visual disturbance.  Gastrointestinal: Negative for nausea and vomiting.  Genitourinary: Positive for pelvic pain.  Allergic/Immunologic: Negative for immunocompromised state.  All other systems reviewed and are negative.    Physical Exam Updated Vital Signs BP  (!) 142/81 (BP Location: Right Arm)   Pulse 78   Temp 98.3 F (36.8 C) (Oral)   Resp 18   SpO2 100%   Physical Exam Vitals signs and nursing note reviewed.  HENT:     Head: Normocephalic and atraumatic.  Neck:     Musculoskeletal: Normal range of motion and neck supple.  Cardiovascular:     Rate and Rhythm: Normal rate.  Pulmonary:     Effort: Pulmonary effort is normal.  Abdominal:     General: Bowel sounds are normal.  Skin:    General: Skin is warm and dry.  Neurological:     Mental Status: She is alert and oriented to person, place, and time.      ED Treatments / Results  Labs (all labs ordered are listed, but only abnormal results are displayed) Labs Reviewed  WET PREP, GENITAL  GC/CHLAMYDIA PROBE AMP ([place]) NOT AT [facility]    EKG None  Radiology No results found.  Procedures Procedures (including critical care time)  Medications Ordered in ED Medications  pantoprazole (PROTONIX) EC tablet 40 mg (40 mg Oral Given 06/18/18 1137)  ibuprofen (ADVIL) tablet 600 mg (600 mg Oral Given 06/18/18 1137)     Initial Impression / Assessment and Plan / ED Course  I have reviewed the triage vital signs and the nursing notes.  Pertinent labs & imaging results that were available during my care of the patient were reviewed by me and considered in my medical decision making (see chart for details).        23 year old female comes in with multiple complaints.  Her primary complaint is abdominal pain.  She reports that she is supposed to see GI and get some kind of MRI imaging.  On my exam there is no focal abdominal tenderness nor is there any peritoneal signs.  She can continue follow-up with the GI doctors for what appears to be a nonemergent/chronic issue.  Additionally she is also having eye discharge.  I seen her few days ago and told her that she did not need antibiotic, but subsequently she had increased drainage and was given erythromycin which cleared her  symptoms.  We will represcribed the medicine.  There is no signs of blepharitis, stye, periorbital cellulitis.  Finally she is also having vaginal discharge for which we will do a pelvic exam.  She has history of BV.  Final Clinical Impressions(s) / ED Diagnoses   Final diagnoses:  Vaginal discharge  Eye drainage    ED Discharge Orders         Ordered    erythromycin ophthalmic ointment     06/18/18 1307           Varney Biles, MD 06/18/18 1323
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
## 2018-06-20 LAB — GC/CHLAMYDIA PROBE AMP (~~LOC~~) NOT AT ARMC
Chlamydia: NEGATIVE
Neisseria Gonorrhea: NEGATIVE

## 2018-06-25 ENCOUNTER — Telehealth: Payer: Self-pay

## 2018-06-25 NOTE — Telephone Encounter (Signed)
Patient requesting Ibuprofen fill, This is not on current list. Can this be given to patient?

## 2018-06-27 ENCOUNTER — Other Ambulatory Visit: Payer: Self-pay | Admitting: Family Medicine

## 2018-06-27 DIAGNOSIS — R1084 Generalized abdominal pain: Secondary | ICD-10-CM

## 2018-06-27 MED ORDER — IBUPROFEN 800 MG PO TABS: 800.0000 mg | ORAL_TABLET | Freq: Three times a day (TID) | ORAL | 0 refills | Status: DC | PRN

## 2018-06-27 NOTE — Progress Notes (Signed)
Refilled

## 2018-07-03 ENCOUNTER — Ambulatory Visit: Payer: Self-pay | Admitting: Family Medicine

## 2018-07-08 ENCOUNTER — Encounter (HOSPITAL_COMMUNITY): Payer: Self-pay | Admitting: Emergency Medicine

## 2018-07-08 ENCOUNTER — Other Ambulatory Visit: Payer: Self-pay

## 2018-07-08 ENCOUNTER — Ambulatory Visit (HOSPITAL_COMMUNITY)
Admission: EM | Admit: 2018-07-08 | Discharge: 2018-07-08 | Disposition: A | Discharge status: Home / Self Care | Payer: No Typology Code available for payment source | Source: Ambulatory Visit | Attending: Emergency Medicine | Admitting: Emergency Medicine

## 2018-07-08 ENCOUNTER — Emergency Department (HOSPITAL_COMMUNITY)
Admission: EM | Admit: 2018-07-08 | Discharge: 2018-07-08 | Disposition: A | Discharge status: Home / Self Care | Payer: Medicaid Other | Attending: Emergency Medicine | Admitting: Emergency Medicine

## 2018-07-08 DIAGNOSIS — F17210 Nicotine dependence, cigarettes, uncomplicated: Secondary | ICD-10-CM | POA: Insufficient documentation

## 2018-07-08 DIAGNOSIS — Z79899 Other long term (current) drug therapy: Secondary | ICD-10-CM | POA: Insufficient documentation

## 2018-07-08 DIAGNOSIS — Z0441 Encounter for examination and observation following alleged adult rape: Secondary | ICD-10-CM | POA: Diagnosis not present

## 2018-07-08 DIAGNOSIS — T7421XA Adult sexual abuse, confirmed, initial encounter: Secondary | ICD-10-CM | POA: Diagnosis not present

## 2018-07-08 LAB — COMPREHENSIVE METABOLIC PANEL
ALT: 21 U/L (ref 0–44)
AST: 21 U/L (ref 15–41)
Albumin: 3.6 g/dL (ref 3.5–5.0)
Alkaline Phosphatase: 64 U/L (ref 38–126)
Anion gap: 8 (ref 5–15)
BUN: 12 mg/dL (ref 6–20)
CO2: 24 mmol/L (ref 22–32)
Calcium: 9 mg/dL (ref 8.9–10.3)
Chloride: 108 mmol/L (ref 98–111)
Creatinine, Ser: 0.95 mg/dL (ref 0.44–1.00)
GFR calc Af Amer: >60 mL/min (ref >60)
GFR calc non Af Amer: >60 mL/min (ref >60)
Glucose, Bld: 87 mg/dL (ref 70–99)
Potassium: 3.6 mmol/L (ref 3.5–5.1)
Sodium: 140 mmol/L (ref 135–145)
Total Bilirubin: 0.6 mg/dL (ref 0.3–1.2)
Total Protein: 6.2 g/dL — ABNORMAL LOW (ref 6.5–8.1)

## 2018-07-08 LAB — RAPID HIV SCREEN (HIV 1/2 AB+AG)
HIV 1/2 Antibodies: NONREACTIVE
HIV-1 P24 Antigen - HIV24: NONREACTIVE

## 2018-07-08 LAB — POC URINE PREG, ED: Preg Test, Ur: NEGATIVE

## 2018-07-08 MED ORDER — ELVITEG-COBIC-EMTRICIT-TENOFAF 150-150-200-10 MG PREPACK
5.0000 | ORAL_TABLET | Freq: Once | ORAL | Status: AC
  Administered 2018-07-08: 5 via ORAL
  Filled 2018-07-08: qty 5

## 2018-07-08 MED ORDER — ELVITEG-COBIC-EMTRICIT-TENOFAF 150-150-200-10 MG PO TABS: 1.0000 | ORAL_TABLET | Freq: Every day | ORAL | 0 refills | Status: DC

## 2018-07-08 MED ORDER — PROMETHAZINE HCL 25 MG PO TABS
25.0000 mg | ORAL_TABLET | Freq: Four times a day (QID) | ORAL | Status: DC | PRN
  Administered 2018-07-08: 25 mg via ORAL

## 2018-07-08 MED ORDER — METRONIDAZOLE 500 MG PO TABS
2000.0000 mg | ORAL_TABLET | Freq: Once | ORAL | Status: AC
  Administered 2018-07-08: 2000 mg via ORAL

## 2018-07-08 MED ORDER — LIDOCAINE HCL (PF) 1 % IJ SOLN
0.9000 mL | Freq: Once | INTRAMUSCULAR | Status: AC
  Administered 2018-07-08: 0.9 mL

## 2018-07-08 MED ORDER — ULIPRISTAL ACETATE 30 MG PO TABS
30.0000 mg | ORAL_TABLET | Freq: Once | ORAL | Status: AC
  Administered 2018-07-08: 30 mg via ORAL

## 2018-07-08 MED ORDER — ELVITEG-COBIC-EMTRICIT-TENOFAF 150-150-200-10 MG PREPACK
ORAL_TABLET | ORAL | Status: AC
  Administered 2018-07-08: 13:00:00 5 via ORAL
  Filled 2018-07-08: qty 1

## 2018-07-08 MED ORDER — CEFTRIAXONE SODIUM 250 MG IJ SOLR
250.0000 mg | Freq: Once | INTRAMUSCULAR | Status: AC
  Administered 2018-07-08: 250 mg via INTRAMUSCULAR

## 2018-07-08 MED ORDER — AZITHROMYCIN 250 MG PO TABS
1000.0000 mg | ORAL_TABLET | Freq: Once | ORAL | Status: AC
  Administered 2018-07-08: 1000 mg via ORAL

## 2018-07-08 MED FILL — GENVOYA TABLET: 150-150-200 | 30 days supply | Qty: 30 | Fill #0

## 2018-07-08 NOTE — ED Notes (Signed)
Spoke to social work; social work will see pt once SANE exam is complete

## 2018-07-08 NOTE — ED Notes (Signed)
Called pt for room. Unable to find patient.

## 2018-07-08 NOTE — SANE Note (Signed)
The SANE/FNE (Forensic Nurse Examiner) consult has been completed. The primary RN and physician have been notified. Please contact the SANE/FNE nurse on call (listed in Amion) with any further concerns.  

## 2018-07-08 NOTE — ED Notes (Signed)
Social work at bedside.  

## 2018-07-08 NOTE — ED Notes (Signed)
Pt observed talking to herself and recording herself talking for the past two hours.
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
## 2018-07-08 NOTE — Discharge Instructions (Addendum)
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 medications: -certain medicines for depression, anxiety, or psychotic disturbances -certain medicines for anxiety or sleep -certain medicines for seizures like carbamazepine, phenobarbital, phenytoin -certain medicines for movement abnormalities as in Parkinson's disease, or for gastrointestinal problems -epinephrine -medicines for allergies or colds -muscle relaxants -narcotic medicines for pain -other medicines that prolong the QT interval (cause an abnormal heart rhythm) -tramadol -trimethobenzamide This list may not describe all possible interactions. Give your health care provider a list of all the medicines, herbs, non-prescription drugs, or dietary supplements you use. Also tell them  if you smoke, drink alcohol, or use illegal drugs. Some items may interact with your medicine. What should I watch for while using  this medicine? Tell your doctor or health care professional if your symptoms do not start to get better in 1 to 2 days. You may get drowsy or dizzy. Do not drive, use machinery, or do anything that needs mental alertness until you know how this medicine affects you. To reduce the risk of dizzy or fainting spells, do not stand or sit up quickly, especially if you are an older patient. Alcohol may increase dizziness and drowsiness. Avoid alcoholic drinks. Your mouth may get dry. Chewing sugarless gum or sucking hard candy, and drinking plenty of water may help. Contact your doctor if the problem does not go away or is severe. This medicine may cause dry eyes and blurred vision. If you wear contact lenses you may feel some discomfort. Lubricating drops may help. See your eye doctor if the problem does not go away or is severe. This medicine can make you more sensitive to the sun. Keep out of the sun. If you cannot avoid being in the sun, wear protective clothing and use sunscreen. Do not use sun lamps or tanning beds/booths. If you are diabetic, check your blood-sugar levels regularly. What side effects may I notice from receiving this medicine? Side effects that you should report to your doctor or health care professional as soon as possible: -blurred vision -irregular heartbeat, palpitations or chest pain -muscle or facial twitches -pain or difficulty passing urine -seizures -skin rash -slowed or shallow breathing -unusual bleeding or bruising -yellowing of the eyes or skin Side effects that usually do not require medical attention (report to your doctor or health care professional if they continue or are bothersome): -headache -nightmares, agitation, nervousness, excitability, not able to sleep (these are more likely in children) -stuffy nose This list may not describe all possible side effects. Call your doctor for medical advice about side effects. You may report side effects to FDA at  1-800-FDA-1088. Where should I keep my medicine? Keep out of the reach of children. Store at room temperature, between 20 and 25 degrees C (68 and 77 degrees F). Protect from light. Throw away any unused medicine after the expiration date. NOTE: This sheet is a summary. It may not cover all possible information. If you have questions about this medicine, talk to your doctor, pharmacist, or health care provider.  2017 Elsevier/Gold Standard (2012-09-02 15:04:46)   Ceftriaxone (Injection/Shot) Also known as:  Rocephin  Ceftriaxone injection What is this medicine? CEFTRIAXONE (sef try AX one) is a cephalosporin antibiotic. It is used to treat certain kinds of bacterial infections. It will not work for colds, flu, or other viral infections. This medicine may be used for other purposes; ask your health care provider or pharmacist if you have questions. COMMON BRAND NAME(S): Rocephin What should I tell my health care provider before I take this medicine? They need to know if you have any of these conditions: -any chronic illness -bowel disease, like colitis -both kidney and liver disease -high bilirubin level in newborn patients -an unusual or allergic reaction to ceftriaxone, other cephalosporin or penicillin antibiotics, foods, dyes, or preservatives -pregnant or trying to get pregnant -breast-feeding How should I use this medicine? This medicine is injected into a muscle or infused it into a vein. It is usually given in a medical office or clinic. If you are to give this medicine you will be taught how to  inject it. Follow instructions carefully. Use your doses at regular intervals. Do not take your medicine more often than directed. Do not skip doses or stop your medicine early even if you feel better. Do not stop taking except on your doctor's advice. Talk to your pediatrician regarding the use of this medicine in children. Special care may be needed. Overdosage: If you think you have  taken too much of this medicine contact a poison control center or emergency room at once. NOTE: This medicine is only for you. Do not share this medicine with others. What if I miss a dose? If you miss a dose, take it as soon as you can. If it is almost time for your next dose, take only that dose. Do not take double or extra doses. What may interact with this medicine? Do not take this medicine with any of the following medications: -intravenous calcium This medicine may also interact with the following medications: -birth control pills This list may not describe all possible interactions. Give your health care provider a list of all the medicines, herbs, non-prescription drugs, or dietary supplements you use. Also tell them if you smoke, drink alcohol, or use illegal drugs. Some items may interact with your medicine. What should I watch for while using this medicine? Tell your doctor or health care professional if your symptoms do not improve or if they get worse. Do not treat diarrhea with over the counter products. Contact your doctor if you have diarrhea that lasts more than 2 days or if it is severe and watery. If you are being treated for a sexually transmitted disease, avoid sexual contact until you have finished your treatment. Having sex can infect your sexual partner. Calcium may bind to this medicine and cause lung or kidney problems. Avoid calcium products while taking this medicine and for 48 hours after taking the last dose of this medicine. What side effects may I notice from receiving this medicine? Side effects that you should report to your doctor or health care professional as soon as possible: -allergic reactions like skin rash, itching or hives, swelling of the face, lips, or tongue -breathing problems -fever, chills -irregular heartbeat -pain when passing urine -seizures -stomach pain, cramps -unusual bleeding, bruising -unusually weak or tired Side effects that  usually do not require medical attention (report to your doctor or health care professional if they continue or are bothersome): -diarrhea -dizzy, drowsy -headache -nausea, vomiting -pain, swelling, irritation where injected -stomach upset -sweating This list may not describe all possible side effects. Call your doctor for medical advice about side effects. You may report side effects to FDA at 1-800-FDA-1088. Where should I keep my medicine? Keep out of the reach of children. Store at room temperature below 25 degrees C (77 degrees F). Protect from light. Throw away any unused vials after the expiration date. NOTE: This sheet is a summary. It may not cover all possible information. If you have questions about this medicine, talk to your doctor, pharmacist, or health care provider.  2017 Elsevier/Gold Standard (2013-07-20 09:14:54)    Cobicistat; Elvitegravir; Emtricitabine; Tenofovir Alafenamide oral tablets   What is this medicine? COBICISTAT; ELVITEGRAVIR; EMTRICITABINE; TENOFOVIR ALAFENAMIDE (koe BIS i stat; el vye TEG ra veer; em tri SIT uh bean; te NOE fo veer) is three antiretroviral medicines and a medication booster in one tablet. It is used to treat HIV. This medicine is not a cure for HIV. It will not stop the spread of HIV to others. This medicine  may be used for other purposes; ask your health care provider or pharmacist if you have questions. COMMON BRAND NAME(S): Genvoya  What should I tell my health care provider before I take this medicine? They need to know if you have any of these conditions: -kidney disease -liver disease -an unusual or allergic reaction to cobicistat, elvitegravir, emtricitabine, tenofovir, other medicines, foods, dyes, or preservatives -pregnant or trying to get pregnant -breast-feeding  How should I use this medicine? Take this medicine by mouth with a glass of water. Follow the directions on the prescription label. Take this medicine with  food. Take your medicine at regular intervals. Do not take your medicine more often than directed. For your anti-HIV therapy to work as well as possible, take each dose exactly as prescribed. Do not skip doses or stop your medicine even if you feel better. Skipping doses may make the HIV virus resistant to this medicine and other medicines. Do not stop taking except on your doctor's advice. Talk to your pediatrician regarding the use of this medicine in children. While this drug may be prescribed for selected conditions, precautions do apply. Overdosage: If you think you have taken too much of this medicine contact a poison control center or emergency room at once. NOTE: This medicine is only for you. Do not share this medicine with others.  What if I miss a dose? If you miss a dose, take it as soon as you can. If it is almost time for your next dose, take only that dose. Do not take double or extra doses.  What may interact with this medicine? Do not take this medicine with any of the following medications: -adefovir -alfuzosin -certain medicines for seizures like carbamazepine, phenobarbital, phenytoin -cisapride -lumacaftor; ivacaftor -lurasidone -medicines for cholesterol like lovastatin, simvastatin -medicines for headaches like dihydroergotamine, ergotamine, methylergonovine -midazolam -other antiviral medicines for HIV or AIDS -pimozide -rifampin -sildenafil -St. John's wort -triazolam This medicine may also interact with the following medications: -antacids -atorvastatin -bosentan -buprenorphine; naloxone -certain antibiotics like clarithromycin, telithromycin, rifabutin, rifapentine -certain medications for anxiety or sleep like buspirone, clorazepate, diazepam, estazolam, flurazepam, zolpidem -certain medicines for blood pressure or heart disease like amlodipine, diltiazem, felodipine, metoprolol, nicardipine, nifedipine, timolol, verapamil -certain medicines for  depression, anxiety, or psychiatric disturbances -certain medicines for erectile dysfunction like avanafil, sildenafil, tadalafil, vardenafil -certain medicines for fungal infection like itraconazole, ketoconazole, voriconazole -colchicine -cyclosporine -dexamethasone -female hormones, like estrogens and progestins and birth control pills -fluticasone -medicines for infection like acyclovir, cidofovir, valacyclovir, ganciclovir, valganciclovir -medicines for irregular heart beat like amiodarone, bepridil, digoxin, disopyramide, dofetilide, flecainide, lidocaine, mexiletine, propafenone, quinidine -metformin -oxcarbazepine -phenothiazines like perphenazine, risperidone, thioridazine -salmeterol -sirolimus -tacrolimus -warfarin This list may not describe all possible interactions. Give your health care provider a list of all the medicines, herbs, non-prescription drugs, or dietary supplements you use. Also tell them if you smoke, drink alcohol, or use illegal drugs. Some items may interact with your medicine.  What should I watch for while using this medicine? Visit your doctor or health care professional for regular check ups. Discuss any new symptoms with your doctor. You will need to have important blood work done while on this medicine. HIV is spread to others through sexual or blood contact. Talk to your doctor about how to stop the spread of HIV. If you have hepatitis B, talk to your doctor if you plan to stop this medicine. The symptoms of hepatitis B may get worse if you stop this medicine. Birth control pills may  not work properly while you are taking this medicine. Talk to your doctor about using an extra method of birth control. Women who can still have children must use a reliable form of barrier contraception, like a condom.  What side effects may I notice from receiving this medicine? Side effects that you should report to your doctor or health care professional as soon as  possible: -allergic reactions like skin rash, itching or hives, swelling of the face, lips, or tongue -breathing problems -fast, irregular heartbeat -muscle pain or weakness -signs and symptoms of kidney injury like trouble passing urine or change in the amount of urine -signs and symptoms of liver injury like dark yellow or brown urine; general ill feeling or flu-like symptoms; light-colored stools; loss of appetite; right upper belly pain; unusually weak or tired; yellowing of the eyes or skin Side effects that usually do not require medical attention (report to your doctor or health care professional if they continue or are bothersome): -diarrhea -headache -nausea -tiredness This list may not describe all possible side effects. Call your doctor for medical advice about side effects. You may report side effects to FDA at 1-800-FDA-1088.  Where should I keep my medicine? Keep out of the reach of children. Store at room temperature below 30 degrees C (86 degrees F). Throw away any unused medicine after the expiration date. NOTE: This sheet is a summary. It may not cover all possible information. If you have questions about this medicine, talk to your doctor, pharmacist, or health care provider.  2018 Elsevier/Gold Standard (2015-10-17 12:54:04)
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
## 2018-07-08 NOTE — ED Notes (Signed)
Sane nurse at bedside 

## 2018-07-09 LAB — HEPATITIS B SURFACE ANTIGEN: Hepatitis B Surface Ag: NEGATIVE

## 2018-07-09 LAB — HEPATITIS C ANTIBODY: HCV Ab: <0.1 s/co ratio (ref 0.0–0.9)

## 2018-07-09 LAB — RPR: RPR Ser Ql: NONREACTIVE

## 2018-07-12 ENCOUNTER — Emergency Department (HOSPITAL_COMMUNITY)
Admission: EM | Admit: 2018-07-12 | Discharge: 2018-07-13 | Disposition: A | Discharge status: Home / Self Care | Payer: Medicaid Other | Attending: Emergency Medicine | Admitting: Emergency Medicine

## 2018-07-12 ENCOUNTER — Encounter (HOSPITAL_COMMUNITY): Payer: Self-pay | Admitting: Emergency Medicine

## 2018-07-12 ENCOUNTER — Other Ambulatory Visit: Payer: Self-pay

## 2018-07-12 DIAGNOSIS — R443 Hallucinations, unspecified: Secondary | ICD-10-CM

## 2018-07-12 DIAGNOSIS — R441 Visual hallucinations: Secondary | ICD-10-CM | POA: Diagnosis not present

## 2018-07-12 DIAGNOSIS — F17210 Nicotine dependence, cigarettes, uncomplicated: Secondary | ICD-10-CM | POA: Diagnosis not present

## 2018-07-12 DIAGNOSIS — I10 Essential (primary) hypertension: Secondary | ICD-10-CM | POA: Insufficient documentation

## 2018-07-12 DIAGNOSIS — Z79899 Other long term (current) drug therapy: Secondary | ICD-10-CM | POA: Insufficient documentation

## 2018-07-12 DIAGNOSIS — Z1159 Encounter for screening for other viral diseases: Secondary | ICD-10-CM | POA: Diagnosis not present

## 2018-07-12 DIAGNOSIS — F4322 Adjustment disorder with anxiety: Secondary | ICD-10-CM | POA: Diagnosis not present

## 2018-07-12 NOTE — ED Provider Notes (Signed)
[facility] DEPT Provider Note   CSN: [id number] Arrival date & time: 07/12/18  2238     History   Chief Complaint Chief Complaint  Patient presents with  . Hallucinations    HPI Barbara Benton is a 23 y.o. female.     The history is provided by the patient and medical records.    23 year old female with history of ADHD, anxiety, bipolar disorder, depression, hypertension, oppositional defiant disorder, presenting to the ED with hallucinations.  Patient reports for the past 3 days she has been having "flat worms" coming out of her skin.  States she first started seeing this after taking a walk in her neighborhood.  That her entire body is itching.  States she has seen them crawling out mostly from her arms but some have come out of her back as well.  When I asked her to describe appearance of worms she got very hostile and aggressive.  She denies SI/HI.  She does appear to be talking to herself in between questioning.  She states she has been taking her medications normally, she is also started taking phentermine for weight loss.  Past Medical History:  Diagnosis Date  . ADHD (attention deficit hyperactivity disorder)   . Anxiety   . Bipolar disorder ([place])   . Depression   . GERD (gastroesophageal reflux disease)   . Hypertension   . ODD (oppositional defiant disorder)     Patient Active Problem List   Diagnosis Date Noted  . Seasonal allergic conjunctivitis 05/20/2018  . Chronic back pain 05/20/2018  . Schizophrenia, acute ([place]) 01/14/2018  . 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 NO PAST SURGERIES       OB History   No obstetric history on file.      Home Medications    Prior to Admission medications   Medication Sig Start Date End Date Taking? Authorizing Provider  chlorhexidine gluconate, MEDLINE KIT, (PERIDEX) 0.12 % solution Use as  directed 15 mLs in the mouth or throat 2 (two) times daily. Patient not taking: Reported on 06/18/2018 05/29/18   Delia Heady, PA-C  elvitegravir-cobicistat-emtricitabine-tenofovir (GENVOYA) 150-150-200-10 MG TABS tablet Take 1 tablet by mouth daily with breakfast. 07/13/18   Virgel Manifold, MD  erythromycin ophthalmic ointment Place a 1/2 inch ribbon of ointment into the lower eyelid bilaterally for 5 days 06/18/18   Varney Biles, MD  ibuprofen (ADVIL) 800 MG tablet Take 1 tablet (800 mg total) by mouth every 8 (eight) hours as needed for moderate pain. Take with food 06/27/18   Lanae Boast, FNP  loratadine (CLARITIN) 10 MG tablet Take 1 tablet (10 mg total) by mouth daily as needed for allergies. 05/20/18   Azzie Glatter, FNP  methocarbamol (ROBAXIN) 500 MG tablet Take 1 tablet (500 mg total) by mouth 2 (two) times daily. Patient not taking: Reported on 06/18/2018 05/20/18   Azzie Glatter, FNP  omeprazole (PRILOSEC) 20 MG capsule Take 20 mg by mouth daily.    [provider]  OVER THE COUNTER MEDICATION Take 1 tablet by mouth daily.    [provider]  Jay Schlichter Oil (STYE) 31.9-57.7 % OINT Apply 4 application to eye 2 (two) times a day. Patient not taking: Reported on 06/18/2018 05/20/18   Azzie Glatter, FNP  ziprasidone (GEODON) 20 MG capsule Take 1 capsule (20 mg total) by mouth 2 (two) times daily with a meal. Patient  taking differently: Take 20 mg by mouth every 6 (six) hours as needed (anxiety).  01/14/18   Suella Broad, FNP    Family History Family History  Problem Relation Age of Onset  . Diabetes Maternal Grandmother   . Cancer Paternal Grandfather        type unknown    Social History Social History   Tobacco Use  . Smoking status: Current Every Day Smoker    Packs/day: 0.50    Types: Cigarettes  . Smokeless tobacco: Never Used  Substance Use Topics  . Alcohol use: Yes    Comment: occ  . Drug use: Not Currently    Types:  Marijuana, MDMA (Ecstacy)     Allergies   Patient has no known allergies.   Review of Systems Review of Systems  Psychiatric/Behavioral: Positive for hallucinations.  All other systems reviewed and are negative.    Physical Exam Updated Vital Signs BP (!) 154/103 (BP Location: Left Arm)   Pulse 81   Temp 98.6 F (37 C) (Oral)   Resp 18   Ht '5\' 2"'$  (1.575 m)   Wt 102.1 kg   SpO2 100%   BMI 41.15 kg/m   Physical Exam Vitals signs and nursing note reviewed.  Constitutional:      Appearance: She is well-developed.  HENT:     Head: Normocephalic and atraumatic.  Eyes:     Conjunctiva/sclera: Conjunctivae normal.     Pupils: Pupils are equal, round, and reactive to light.  Neck:     Musculoskeletal: Normal range of motion.  Cardiovascular:     Rate and Rhythm: Normal rate and regular rhythm.     Heart sounds: Normal heart sounds.  Pulmonary:     Effort: Pulmonary effort is normal.     Breath sounds: Normal breath sounds.  Abdominal:     General: Bowel sounds are normal.     Palpations: Abdomen is soft.  Musculoskeletal: Normal range of motion.  Skin:    General: Skin is warm and dry.  Neurological:     Mental Status: She is alert and oriented to person, place, and time.  Psychiatric:        Attention and Perception: She perceives visual hallucinations.     Comments: Reports worms are coming out of her skin Responding to internal stimuli prior to and after assessment      ED Treatments / Results  Labs (all labs ordered are listed, but only abnormal results are displayed) Labs Reviewed  COMPREHENSIVE METABOLIC PANEL - Abnormal; Notable for the following components:      Result Value   Total Bilirubin 0.2 (*)    All other components within normal limits  ACETAMINOPHEN LEVEL - Abnormal; Notable for the following components:   Acetaminophen (Tylenol), Serum <10 (*)    All other components within normal limits  ETHANOL  SALICYLATE LEVEL  CBC  RAPID URINE  DRUG SCREEN, HOSP PERFORMED  I-STAT BETA HCG BLOOD, ED ([facility], [facility], [facility] ONLY)    EKG    Radiology No results found.  Procedures Procedures (including critical care time)  Medications Ordered in ED Medications - No data to display   Initial Impression / Assessment and Plan / ED Course  I have reviewed the triage vital signs and the nursing notes.  Pertinent labs & imaging results that were available during my care of the patient were reviewed by me and considered in my medical decision making (see chart for details).  23 year old female here with complaint of worms  coming out of her skin.  When asked her to explain/describe this further she got somewhat angry at me.  She denies any suicidal homicidal ideation.  States she has been taking her medications as prescribed.  Screening labs obtained and are overall reassuring.  Patient medically cleared.  TTS to evaluate.  Ordered patient's home meds she refused them.  2:59 AM Awaiting TTS assessment.  I have ordered patient's home medications, however she is refusing them.  I have gone in to talk with her and she states she would rather just go home and take her medications.  Her only complaint here was seeing worms crawling out of her skin.  She states "I was homeless for a while, that is probably what it is".  She continues to deny any suicidal or homicidal ideation.  She has not displayed any aggressive or self mutilating behavior, mostly just talking and laughing to herself.  She does not appear to be a danger to herself or others at this time.  She does not want to wait here for TTS assessment.  At this point, I do not have a great reason to IVC here, feel it is reasonable to discharge as patient desires, I have encouraged her to follow-up with her psychiatrist.  Given OP resources as well if needed.  She can return here for any new/acute changes.    Final Clinical Impressions(s) / ED Diagnoses   Final diagnoses:  Hallucinations    ED  Discharge Orders    None       Larene Pickett, PA-C 07/13/18 0351    Ripley Fraise, MD 07/13/18 [phone]

## 2018-07-12 NOTE — ED Notes (Signed)
Bed: [facility] Expected date:  Expected time:  Means of arrival:  Comments: 

## 2018-07-12 NOTE — ED Triage Notes (Signed)
EMS called by [employer], patient states she has "worms coming out of her skin." Patient responding to internal stimuli while in triage.

## 2018-07-13 ENCOUNTER — Other Ambulatory Visit: Payer: Self-pay

## 2018-07-13 ENCOUNTER — Emergency Department (EMERGENCY_DEPARTMENT_HOSPITAL)
Admission: EM | Admit: 2018-07-13 | Discharge: 2018-07-14 | Disposition: A | Discharge status: Home / Self Care | Payer: Medicaid Other | Source: Home / Self Care | Attending: Emergency Medicine | Admitting: Emergency Medicine

## 2018-07-13 ENCOUNTER — Encounter (HOSPITAL_COMMUNITY): Payer: Self-pay | Admitting: Emergency Medicine

## 2018-07-13 DIAGNOSIS — F23 Brief psychotic disorder: Secondary | ICD-10-CM

## 2018-07-13 DIAGNOSIS — Z03818 Encounter for observation for suspected exposure to other biological agents ruled out: Secondary | ICD-10-CM | POA: Insufficient documentation

## 2018-07-13 DIAGNOSIS — F17210 Nicotine dependence, cigarettes, uncomplicated: Secondary | ICD-10-CM | POA: Insufficient documentation

## 2018-07-13 DIAGNOSIS — F22 Delusional disorders: Secondary | ICD-10-CM | POA: Insufficient documentation

## 2018-07-13 DIAGNOSIS — Z046 Encounter for general psychiatric examination, requested by authority: Secondary | ICD-10-CM | POA: Insufficient documentation

## 2018-07-13 DIAGNOSIS — I10 Essential (primary) hypertension: Secondary | ICD-10-CM | POA: Insufficient documentation

## 2018-07-13 DIAGNOSIS — Z79899 Other long term (current) drug therapy: Secondary | ICD-10-CM | POA: Insufficient documentation

## 2018-07-13 DIAGNOSIS — R451 Restlessness and agitation: Secondary | ICD-10-CM | POA: Insufficient documentation

## 2018-07-13 DIAGNOSIS — F4322 Adjustment disorder with anxiety: Secondary | ICD-10-CM | POA: Diagnosis present

## 2018-07-13 LAB — COMPREHENSIVE METABOLIC PANEL
ALT: 23 U/L (ref 0–44)
AST: 22 U/L (ref 15–41)
Albumin: 4 g/dL (ref 3.5–5.0)
Alkaline Phosphatase: 83 U/L (ref 38–126)
Anion gap: 8 (ref 5–15)
BUN: 10 mg/dL (ref 6–20)
CO2: 25 mmol/L (ref 22–32)
Calcium: 9 mg/dL (ref 8.9–10.3)
Chloride: 106 mmol/L (ref 98–111)
Creatinine, Ser: 0.88 mg/dL (ref 0.44–1.00)
GFR calc Af Amer: >60 mL/min (ref >60)
GFR calc non Af Amer: >60 mL/min (ref >60)
Glucose, Bld: 92 mg/dL (ref 70–99)
Potassium: 3.9 mmol/L (ref 3.5–5.1)
Sodium: 139 mmol/L (ref 135–145)
Total Bilirubin: 0.2 mg/dL — ABNORMAL LOW (ref 0.3–1.2)
Total Protein: 7.1 g/dL (ref 6.5–8.1)

## 2018-07-13 LAB — CBC
HCT: 42.7 % (ref 36.0–46.0)
Hemoglobin: 13.1 g/dL (ref 12.0–15.0)
MCH: 28.6 pg (ref 26.0–34.0)
MCHC: 30.7 g/dL (ref 30.0–36.0)
MCV: 93.2 fL (ref 80.0–100.0)
Platelets: 278 10*3/uL (ref 150–400)
RBC: 4.58 MIL/uL (ref 3.87–5.11)
RDW: 13.9 % (ref 11.5–15.5)
WBC: 6.2 10*3/uL (ref 4.0–10.5)
nRBC: 0 % (ref 0.0–0.2)

## 2018-07-13 LAB — I-STAT BETA HCG BLOOD, ED (MC, WL, AP ONLY): I-stat hCG, quantitative: <5 m[IU]/mL (ref <5)

## 2018-07-13 LAB — ETHANOL: Alcohol, Ethyl (B): <10 mg/dL (ref <10)

## 2018-07-13 LAB — SALICYLATE LEVEL: Salicylate Lvl: <7 mg/dL (ref 2.8–30.0)

## 2018-07-13 LAB — ACETAMINOPHEN LEVEL: Acetaminophen (Tylenol), Serum: <10 ug/mL — ABNORMAL LOW (ref 10–30)

## 2018-07-13 LAB — SARS CORONAVIRUS 2 BY RT PCR (HOSPITAL ORDER, PERFORMED IN ~~LOC~~ HOSPITAL LAB): SARS Coronavirus 2: NEGATIVE

## 2018-07-13 MED ORDER — IBUPROFEN 800 MG PO TABS: 800.0000 mg | ORAL_TABLET | Freq: Three times a day (TID) | ORAL | Status: DC | PRN

## 2018-07-13 MED ORDER — PANTOPRAZOLE SODIUM 40 MG PO TBEC: 40.0000 mg | DELAYED_RELEASE_TABLET | Freq: Every day | ORAL | Status: DC

## 2018-07-13 MED ORDER — LORATADINE 10 MG PO TABS: 10.0000 mg | ORAL_TABLET | Freq: Every day | ORAL | Status: DC | PRN

## 2018-07-13 MED ORDER — ZIPRASIDONE HCL 20 MG PO CAPS: 20.0000 mg | ORAL_CAPSULE | Freq: Two times a day (BID) | ORAL | Status: DC

## 2018-07-13 MED ORDER — ELVITEG-COBIC-EMTRICIT-TENOFAF 150-150-200-10 MG PO TABS: 1.0000 | ORAL_TABLET | Freq: Every day | ORAL | Status: DC

## 2018-07-13 MED ORDER — STERILE WATER FOR INJECTION IJ SOLN
INTRAMUSCULAR | Status: AC
  Administered 2018-07-13: 1.2 mL
  Filled 2018-07-13: qty 10

## 2018-07-13 MED ORDER — ZIPRASIDONE MESYLATE 20 MG IM SOLR
10.0000 mg | Freq: Once | INTRAMUSCULAR | Status: AC
  Administered 2018-07-13: 10 mg via INTRAMUSCULAR
  Filled 2018-07-13: qty 20

## 2018-07-13 MED ORDER — ZIPRASIDONE HCL 20 MG PO CAPS
20.0000 mg | ORAL_CAPSULE | Freq: Two times a day (BID) | ORAL | Status: DC
  Filled 2018-07-13: qty 1

## 2018-07-13 NOTE — ED Triage Notes (Signed)
Patient arrived by [facility]. Patient states she is seeing worms coming out of her skin and talking to people that is not there.

## 2018-07-13 NOTE — ED Notes (Signed)
Pt is aware that urine sample is needed. Pt has been to the restroom twice and come out without sample both times.

## 2018-07-13 NOTE — ED Provider Notes (Signed)
[facility] DEPT Provider Note   CSN: [id number] Arrival date & time: 07/13/18  3382     History   Chief Complaint Chief Complaint  Patient presents with  . Hallucinations    HPI Barbara Benton is a 23 y.o. female.     Patient with hx bipolar disorder, htn, anxiety, c/o worms coming out of all areas of body. Pt is very agitated, yelling out, at times incoherently, at times stating she is [place], then that God sent her, rapidly moving/speaking of one unrelated topic then the next. Very difficult historian, not responding to most questions asked - level 5 caveat.   The history is provided by the patient. The history is limited by the condition of the patient.    Past Medical History:  Diagnosis Date  . ADHD (attention deficit hyperactivity disorder)   . Anxiety   . Bipolar disorder ([place])   . Depression   . GERD (gastroesophageal reflux disease)   . Hypertension   . ODD (oppositional defiant disorder)     Patient Active Problem List   Diagnosis Date Noted  . Seasonal allergic conjunctivitis 05/20/2018  . Chronic back pain 05/20/2018  . Schizophrenia, acute ([place]) 01/14/2018  . 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 NO PAST SURGERIES       OB History   No obstetric history on file.      Home Medications    Prior to Admission medications   Medication Sig Start Date End Date Taking? Authorizing Provider  chlorhexidine gluconate, MEDLINE KIT, (PERIDEX) 0.12 % solution Use as directed 15 mLs in the mouth or throat 2 (two) times daily. Patient not taking: Reported on 06/18/2018 05/29/18   Delia Heady, PA-C  elvitegravir-cobicistat-emtricitabine-tenofovir (GENVOYA) 150-150-200-10 MG TABS tablet Take 1 tablet by mouth daily with breakfast. 07/13/18   Virgel Manifold, MD  erythromycin ophthalmic ointment Place a 1/2 inch ribbon of ointment into the  lower eyelid bilaterally for 5 days 06/18/18   Varney Biles, MD  ibuprofen (ADVIL) 800 MG tablet Take 1 tablet (800 mg total) by mouth every 8 (eight) hours as needed for moderate pain. Take with food 06/27/18   Lanae Boast, FNP  loratadine (CLARITIN) 10 MG tablet Take 1 tablet (10 mg total) by mouth daily as needed for allergies. 05/20/18   Azzie Glatter, FNP  methocarbamol (ROBAXIN) 500 MG tablet Take 1 tablet (500 mg total) by mouth 2 (two) times daily. Patient not taking: Reported on 06/18/2018 05/20/18   Azzie Glatter, FNP  omeprazole (PRILOSEC) 20 MG capsule Take 20 mg by mouth daily.    [provider]  OVER THE COUNTER MEDICATION Take 1 tablet by mouth daily.    [provider]  Jay Schlichter Oil (STYE) 31.9-57.7 % OINT Apply 4 application to eye 2 (two) times a day. Patient not taking: Reported on 06/18/2018 05/20/18   Azzie Glatter, FNP  ziprasidone (GEODON) 20 MG capsule Take 1 capsule (20 mg total) by mouth 2 (two) times daily with a meal. Patient taking differently: Take 20 mg by mouth every 6 (six) hours as needed (anxiety).  01/14/18   Suella Broad, FNP    Family History Family History  Problem Relation Age of Onset  . Diabetes Maternal Grandmother   . Cancer Paternal Grandfather        type unknown    Social History Social History   Tobacco Use  .  Smoking status: Current Every Day Smoker    Packs/day: 0.50    Types: Cigarettes  . Smokeless tobacco: Never Used  Substance Use Topics  . Alcohol use: Yes    Comment: occ  . Drug use: Not Currently    Types: Marijuana, MDMA (Ecstacy)     Allergies   Patient has no known allergies.   Review of Systems Review of Systems  Unable to perform ROS: Psychiatric disorder  Constitutional: Negative for fever.  level 5 caveat - pt unresponsive to ros questions.    Physical Exam Updated Vital Signs BP (!) 146/84   Pulse 76   Temp 98.1 F (36.7 C) (Oral)   Resp 18   Ht  1.575 m (5' 2" )   Wt 102.1 kg   SpO2 99%   BMI 41.15 kg/m   Physical Exam Vitals signs and nursing note reviewed.  Constitutional:      Appearance: Normal appearance. She is well-developed.  HENT:     Head: Atraumatic.     Nose: Nose normal.     Mouth/Throat:     Mouth: Mucous membranes are moist.  Eyes:     General: No scleral icterus.    Conjunctiva/sclera: Conjunctivae normal.     Pupils: Pupils are equal, round, and reactive to light.  Neck:     Musculoskeletal: Normal range of motion and neck supple. No neck rigidity or muscular tenderness.     Trachea: No tracheal deviation.     Comments: Thyroid not enlarged, not tender.  Cardiovascular:     Rate and Rhythm: Normal rate and regular rhythm.     Pulses: Normal pulses.     Heart sounds: Normal heart sounds. No murmur. No friction rub. No gallop.   Pulmonary:     Effort: Pulmonary effort is normal. No respiratory distress.     Breath sounds: Normal breath sounds.  Abdominal:     General: Bowel sounds are normal. There is no distension.     Palpations: Abdomen is soft.     Tenderness: There is no abdominal tenderness. There is no guarding.  Genitourinary:    Comments: No cva tenderness.  Musculoskeletal:        General: No swelling.  Skin:    General: Skin is warm and dry.     Findings: No rash.  Neurological:     Mental Status: She is alert.     Comments: Alert, speech fluent. No dysarthria. Moves bil ext purposefully with good strength. Steady gait.   Psychiatric:     Comments: Patient agitated, yelling f word, and calling staff 'bitches'. Pt with delusional thoughts, hallucinating. States worms coming out all over body.       ED Treatments / Results  Labs (all labs ordered are listed, but only abnormal results are displayed) Results for orders placed or performed during the hospital encounter of 07/12/18  Comprehensive metabolic panel  Result Value Ref Range   Sodium 139 135 - 145 mmol/L   Potassium 3.9  3.5 - 5.1 mmol/L   Chloride 106 98 - 111 mmol/L   CO2 25 22 - 32 mmol/L   Glucose, Bld 92 70 - 99 mg/dL   BUN 10 6 - 20 mg/dL   Creatinine, Ser 0.88 0.44 - 1.00 mg/dL   Calcium 9.0 8.9 - 10.3 mg/dL   Total Protein 7.1 6.5 - 8.1 g/dL   Albumin 4.0 3.5 - 5.0 g/dL   AST 22 15 - 41 U/L   ALT 23 0 - 44 U/L  Alkaline Phosphatase 83 38 - 126 U/L   Total Bilirubin 0.2 (L) 0.3 - 1.2 mg/dL   GFR calc non Af Amer >60 >60 mL/min   GFR calc Af Amer >60 >60 mL/min   Anion gap 8 5 - 15  Ethanol  Result Value Ref Range   Alcohol, Ethyl (B) <47 <42 mg/dL  Salicylate level  Result Value Ref Range   Salicylate Lvl <5.9 2.8 - 30.0 mg/dL  Acetaminophen level  Result Value Ref Range   Acetaminophen (Tylenol), Serum <10 (L) 10 - 30 ug/mL  cbc  Result Value Ref Range   WBC 6.2 4.0 - 10.5 K/uL   RBC 4.58 3.87 - 5.11 MIL/uL   Hemoglobin 13.1 12.0 - 15.0 g/dL   HCT 42.7 36.0 - 46.0 %   MCV 93.2 80.0 - 100.0 fL   MCH 28.6 26.0 - 34.0 pg   MCHC 30.7 30.0 - 36.0 g/dL   RDW 13.9 11.5 - 15.5 %   Platelets 278 150 - 400 K/uL   nRBC 0.0 0.0 - 0.2 %  I-Stat beta hCG blood, ED  Result Value Ref Range   I-stat hCG, quantitative <5.0 <5 mIU/mL   Comment 3            EKG    Radiology No results found.  Procedures Procedures (including critical care time)  Medications Ordered in ED Medications  sterile water (preservative free) injection (has no administration in time range)  ziprasidone (GEODON) injection 10 mg (10 mg Intramuscular Given by Other 07/13/18 0744)     Initial Impression / Assessment and Plan / ED Course  I have reviewed the triage vital signs and the nursing notes.  Pertinent labs & imaging results that were available during my care of the patient were reviewed by me and considered in my medical decision making (see chart for details).  Labs previously sent this AM.  [facility] team consulted.  Reviewed nursing notes and prior charts for additional history.   Attempted to  reassure/calm patient. Pt remains agitated, yelling, calling me/staff bitches, uncooperative. Pt with paranoid thoughts, delusions, acute psychosis. geodon given for symptom relief.   Earlier labs reviewed by me - chem normal.   Disposition per [facility] team. Anticipate that patient will require inpatient psychiatric treatment.   Recheck, pt calmer, less agitated.    Final Clinical Impressions(s) / ED Diagnoses   Final diagnoses:  None    ED Discharge Orders    None       Lajean Saver, MD 07/13/18 [phone]

## 2018-07-13 NOTE — ED Notes (Signed)
Pt with tangenital speech pattern, hostile body language when interacting with staff.  This RN along with charge RN attempted to explain to pt the necessity of prescribed medication.  Pt continues to speak in erratic manner, referring to being [place], she has a forensic case tomorrow, she only wants prozac.   Will continue to monitor.

## 2018-07-13 NOTE — ED Notes (Signed)
Pt asleep in room, door remains open to enable staff to better monitor pt.

## 2018-07-13 NOTE — ED Notes (Signed)
Unsuccessful blood draw 

## 2018-07-13 NOTE — ED Notes (Addendum)
Pt screaming profanities.  Pt heard having conversation in the room, despite no one else being present.  Pt remains seated on the bed.    Will continue to monitor.

## 2018-07-13 NOTE — ED Notes (Signed)
Pt yelling out from the room asking for somewhere to "get the worms out of her arms". Pt has frequent out bursts of just yelling. Pt asked to please refrain from yelling while in the ED.

## 2018-07-13 NOTE — ED Notes (Signed)
Pt sleeping, room door remains open for best visual monitoring for pt safety.

## 2018-07-13 NOTE — ED Notes (Signed)
Pt arrived to unit sleepy.  She woke up pretty quickly and was a little agitated but calmed down quickly.  Lunch was given.  Sitter is present .  Pt is in no distress.

## 2018-07-13 NOTE — ED Notes (Signed)
Pt called 911 while taking her back to a room. She stated she was trying to get in touch with the forensics lab and talk to an officer.

## 2018-07-13 NOTE — Discharge Instructions (Signed)
I strongly encourage you to follow-up with your primary care doctor and your psychiatrist. Please continue taking all your home medications. I have attached list of other facilities and psychiatric services available if needed.

## 2018-07-13 NOTE — ED Notes (Signed)
Patient screaming out about worms. Patient reoriented and made aware that no worms are seen. Will continue to monitor patient.

## 2018-07-14 ENCOUNTER — Encounter (HOSPITAL_COMMUNITY): Payer: Self-pay | Admitting: Clinical

## 2018-07-14 DIAGNOSIS — F4322 Adjustment disorder with anxiety: Secondary | ICD-10-CM | POA: Diagnosis present

## 2018-07-14 MED ORDER — ZIPRASIDONE MESYLATE 20 MG IM SOLR: 20.0000 mg | INTRAMUSCULAR | Status: DC | PRN

## 2018-07-14 MED ORDER — ELVITEG-COBIC-EMTRICIT-TENOFAF 150-150-200-10 MG PO TABS
1.0000 | ORAL_TABLET | Freq: Every day | ORAL | Status: DC
  Administered 2018-07-14: 1 via ORAL
  Filled 2018-07-14: qty 1

## 2018-07-14 MED ORDER — PANTOPRAZOLE SODIUM 40 MG PO TBEC
40.0000 mg | DELAYED_RELEASE_TABLET | Freq: Every day | ORAL | Status: DC
  Filled 2018-07-14: qty 1

## 2018-07-14 MED ORDER — ZIPRASIDONE HCL 20 MG PO CAPS
20.0000 mg | ORAL_CAPSULE | Freq: Two times a day (BID) | ORAL | Status: DC
  Filled 2018-07-14: qty 1

## 2018-07-14 MED ORDER — ACETAMINOPHEN 325 MG PO TABS: 650.0000 mg | ORAL_TABLET | ORAL | Status: DC | PRN

## 2018-07-14 MED ORDER — LORAZEPAM 1 MG PO TABS: 1.0000 mg | ORAL_TABLET | ORAL | Status: DC | PRN

## 2018-07-14 MED ORDER — ZOLPIDEM TARTRATE 5 MG PO TABS: 5.0000 mg | ORAL_TABLET | Freq: Every evening | ORAL | Status: DC | PRN

## 2018-07-14 MED ORDER — RISPERIDONE 1 MG PO TBDP: 2.0000 mg | ORAL_TABLET | Freq: Three times a day (TID) | ORAL | Status: DC | PRN

## 2018-07-14 NOTE — BH Assessment (Signed)
Tele Assessment Note   Patient Name: Barbara Benton MRN: [id number] Referring Physician: Ashok Cordia Location of Patient: [facility] ED Location of Provider: [place] is an 23 y.o. female presenting voluntarily to [facility] ED via [facility]. Per EDP note: "Patient with hx bipolar disorder, htn, anxiety, c/o worms coming out of all areas of body. Pt is very agitated, yelling out, at times incoherently, at times stating she is [place], then that God sent her, rapidly moving/speaking of one unrelated topic then the next. Very difficult historian, not responding to most questions asked - level 5 caveat."  Upon this clinician's exam patient is calm and cooperative. She received IM medication for agitation and slept through the night. Patient states she came in the previous evening because "I felt like worms were crawling out of my skin." Patient reports she was taking an online test for a new job opportunity the previous day and she became anxious. She states she then felt worms crawling all over her body so she called EMS. She states she used to be homeless and was paranoid that she had worms. Patient states that she is prescribed Prozac but does not take it consistently. Per chart review patient also takes Risperdal. Patient states the previous evening "they started me back on my medications and I feel better." Patient denies SI/HI/AVH. She states she receives medication management and outpatient therapy from the [facility]. She denies any current substance use. Patient states she does not feel she needs a psychiatric admission.  Patient is alert and oriented x 4. She is dressed in scrubs, sitting up in bed. Her speech is logical, eye contact is fair, and thoughts are mildly disorganized. Her mood is pleasant and affect is congruent. Patient's insight, judgement, and impulse control are partial. She does not appear to be responding to internal stimuli or experiencing delusional thought  content.   Diagnosis: F25.0 Schizoaffective Disorder, Bipolar type  Past Medical History:  Past Medical History:  Diagnosis Date  . ADHD (attention deficit hyperactivity disorder)   . Anxiety   . Bipolar disorder ([place])   . Depression   . GERD (gastroesophageal reflux disease)   . Hypertension   . ODD (oppositional defiant disorder)     Past Surgical History:  Procedure Laterality Date  . NO PAST SURGERIES      Family History:  Family History  Problem Relation Age of Onset  . Diabetes Maternal Grandmother   . Cancer Paternal Grandfather        type unknown    Social History:  reports that she has been smoking cigarettes. She has been smoking about 0.50 packs per day. She has never used smokeless tobacco. She reports current alcohol use. She reports previous drug use. Drugs: Marijuana and MDMA (Ecstacy).  Additional Social History:  Alcohol / Drug Use Pain Medications: see MAR Prescriptions: see MAR Over the Counter: see MAR History of alcohol / drug use?: No history of alcohol / drug abuse  CIWA: CIWA-Ar BP: 120/76 Pulse Rate: 74 COWS:    Allergies: No Known Allergies  Home Medications: (Not in a hospital admission)   OB/GYN Status:  No LMP recorded. (Menstrual status: Irregular Periods).  General Assessment Data Assessment unable to be completed: Yes Reason for not completing assessment: Per Rip Harbour, RN pt is currently sleeping and unable to engage in TTS consult. RN to call once pt is roused, alert and able to engage in TTS assessment.  Location of Assessment: [facility] ED TTS Assessment: In system Is  this a Tele or Face-to-Face Assessment?: Tele Assessment Is this an Initial Assessment or a Re-assessment for this encounter?: Initial Assessment Patient Accompanied by:: N/A Language Other than English: No Living Arrangements: Other (Comment)(apartment) What gender do you identify as?: Female Marital status: Single Maiden name: Biasi Pregnancy Status: No Living  Arrangements: Alone Can pt return to current living arrangement?: Yes Admission Status: Voluntary Is patient capable of signing voluntary admission?: Yes Referral Source: Self/Family/Friend Insurance type: None     Crisis Care Plan Living Arrangements: Alone Legal Guardian: (self) [facility]  Education Status Is patient currently in school?: No Is the patient employed, unemployed or receiving disability?: Employed  Risk to self with the past 6 months Suicidal Ideation: No Has patient been a risk to self within the past 6 months prior to admission? : No Suicidal Intent: No Has patient had any suicidal intent within the past 6 months prior to admission? : No Is patient at risk for suicide?: No Suicidal Plan?: No Has patient had any suicidal plan within the past 6 months prior to admission? : No Access to Means: No What has been your use of drugs/alcohol within the last 12 months?: none noted Previous Attempts/Gestures: No How many times?: 0 Other Self Harm Risks: none Triggers for Past Attempts: None known Intentional Self Injurious Behavior: None Family Suicide History: No Recent stressful life event(s): Other (Comment)(starting a new job) Persecutory voices/beliefs?: No Depression: No Depression Symptoms: Feeling angry/irritable Substance abuse history and/or treatment for substance abuse?: No Suicide prevention information given to non-admitted patients: Not applicable  Risk to Others within the past 6 months Homicidal Ideation: No Does patient have any lifetime risk of violence toward others beyond the six months prior to admission? : No Thoughts of Harm to Others: No Current Homicidal Intent: No Current Homicidal Plan: No Access to Homicidal Means: No Identified Victim: none History of harm to others?: No Assessment of Violence: None Noted Violent Behavior Description: none noted Does patient have  access to weapons?: No Criminal Charges Pending?: No Does patient have a court date: No Is patient on probation?: No  Psychosis Hallucinations: None noted Delusions: None noted  Mental Status Report Appearance/Hygiene: In scrubs Eye Contact: Good Motor Activity: Freedom of movement Speech: Logical/coherent Level of Consciousness: Alert Mood: Pleasant Affect: Appropriate to circumstance Anxiety Level: None Thought Processes: Coherent, Relevant Judgement: Partial Orientation: Person, Place, Time, Situation Obsessive Compulsive Thoughts/Behaviors: None  Cognitive Functioning Concentration: Normal Memory: Recent Intact, Remote Intact Is patient IDD: No Insight: Fair Impulse Control: Fair Appetite: Good Have you had any weight changes? : No Change Sleep: No Change Total Hours of Sleep: 8 Vegetative Symptoms: None  ADLScreening [facility] Assessment Services) Patient's cognitive ability adequate to safely complete daily activities?: Yes Patient able to express need for assistance with ADLs?: Yes Independently performs ADLs?: Yes (appropriate for developmental age)  Prior Inpatient Therapy Prior Inpatient Therapy: Yes Prior Therapy Dates: 2020 [facility] Reason for Treatment: bipolar, psychosis  Prior Outpatient Therapy Prior Outpatient Therapy: Yes Prior Therapy Dates: ongoing [facility] Reason for Treatment: schizoaffective bipolar type Does patient have an ACCT team?: No Does patient have Intensive In-House Services?  : No Does patient have [facility] services? : No Does patient have P4CC services?: No  ADL Screening (condition at time of admission) Patient's cognitive ability adequate to safely complete daily activities?: Yes Is the patient deaf or have difficulty hearing?: No Does the patient  have difficulty seeing, even when wearing glasses/contacts?: No Does the patient have difficulty  concentrating, remembering, or making decisions?: No Patient able to express need for assistance with ADLs?: Yes Does the patient have difficulty dressing or bathing?: No Independently performs ADLs?: Yes (appropriate for developmental age) Does the patient have difficulty walking or climbing stairs?: No Weakness of Legs: None Weakness of Arms/Hands: None  Home Assistive Devices/Equipment Home Assistive Devices/Equipment: None  Therapy Consults (therapy consults require a physician order) PT Evaluation Needed: No OT Evalulation Needed: No SLP Evaluation Needed: No Abuse/Neglect Assessment (Assessment to be complete while patient is alone) Abuse/Neglect Assessment Can Be Completed: Yes Physical Abuse: Denies Verbal Abuse: Denies Sexual Abuse: Denies Exploitation of patient/patient's resources: Denies Self-Neglect: Denies Values / Beliefs Cultural Requests During Hospitalization: None Spiritual Requests During Hospitalization: None Consults Spiritual Care Consult Needed: No Social Work Consult Needed: No Merchant navy officerAdvance Directives (For Healthcare) Does Patient Have a Medical Advance Directive?: No Would patient like information on creating a medical advance directive?: No - Patient declined          Disposition: Pending provider disposition Disposition Initial Assessment Completed for this Encounter: Yes Patient referred to: Outpatient clinic referral  This service was provided via telemedicine using a 2-way, interactive audio and video technology.  Names of all persons participating in this telemedicine service and their role in this encounter. Name: Ricki RodriguezHeaven Vaux Role: patient  Name: Celedonio MiyamotoMeredith Celeste Tavenner, LCSW Role: TTS  Name:  Role:   Name:  Role:     Celedonio MiyamotoMeredith  Emme Rosenau 07/14/2018 8:15 AM

## 2018-07-14 NOTE — [facility] Counselor (Signed)
Per Rip Harbour, RN pt is currently sleeping and unable to engage in TTS consult. RN to call once pt is roused, alert and able to engage in TTS assessment.    Vertell Novak, [place], [facility], [address] [facility] Triage Specialist [phone]  [phone]

## 2018-07-14 NOTE — BH Assessment (Addendum)
Staffed with Dr. [name]. She and provider to see for potential discharge.  Per Dr. [name] patient is psych cleared for discharge.

## 2018-07-14 NOTE — Discharge Instructions (Signed)
For your behavioral health needs, you are advised to continue treatment with your [facility] providers [facility]. °

## 2018-07-14 NOTE — BH Assessment (Signed)
[facility] Assessment Progress Note  Per Buford Dresser, DO, this pt does not require psychiatric hospitalization at this time.  Pt presents under IVC initiated by EDP Lajean Saver, MD, which Dr [name] has rescinded.  Pt is to be discharged from [facility] with recommendation to continue treatment through the [facility].  This has been included in pt's discharge instructions.  Pt's nurse, Diane, has been notified.  Jalene Mullet, [place] Triage Specialist [phone]

## 2018-07-14 NOTE — Consult Note (Addendum)
[facility] Psych ED Discharge  07/14/2018 12:34 PM Barbara Benton  MRN:  [id number] Principal Problem: Adjustment disorder with anxiety Discharge Diagnoses: Principal Problem:   Adjustment disorder with anxiety   Subjective:   Barbara Benton, 23 y.o., female patient presented to [facility] via EMS with complaints of worms coming out of body.  Patient seen via telepsych by this provider; chart reviewed and consulted with Dr. [name] on 07/14/18.  On evaluation Barbara Benton reports she is feeling much better this morning.  States she was stressed and overwhelmed when she came in yesterday.  Patient denies suicidal/self-harm/homicidal ideation, psychosis, and paranoia.  Patient reports that she is taking an online course and wants to get back to it.    During evaluation Barbara Benton is sitting up in bed; she is alert/oriented x 4; calm/cooperative; and mood congruent with affect.  Patient is speaking in a clear tone at moderate volume, and normal pace; with good eye contact.  Her thought process is coherent and relevant; There is no indication that she is currently responding to internal/external stimuli or experiencing delusional thought content.  Patient denies suicidal/self-harm/homicidal ideation, psychosis, and paranoia.  Patient has remained calm throughout assessment and has answered questions appropriately.    Total Time spent with patient: 30 minutes  Past Psychiatric History: See list below  Past Medical History:  Past Medical History:  Diagnosis Date  . ADHD (attention deficit hyperactivity disorder)   . Anxiety   . Bipolar disorder ([place])   . Depression   . GERD (gastroesophageal reflux disease)   . Hypertension   . ODD (oppositional defiant disorder)     Past Surgical History:  Procedure Laterality Date  . NO PAST SURGERIES     Family History:  Family History  Problem Relation Age of Onset  . Diabetes Maternal Grandmother   . Cancer Paternal Grandfather        type unknown   Family  Psychiatric  History: Aunt-anxiety  Social History:  Social History   Substance and Sexual Activity  Alcohol Use Yes   Comment: occ     Social History   Substance and Sexual Activity  Drug Use Not Currently  . Types: Marijuana, MDMA (Ecstacy)    Social History   Socioeconomic History  . Marital status: Single    Spouse name: Not on file  . Number of children: 0  . Years of education: Not on file  . Highest education level: Not on file  Occupational History  . Occupation: Medical illustrator  Social Needs  . Financial resource strain: Not on file  . Food insecurity    Worry: Not on file    Inability: Not on file  . Transportation needs    Medical: Not on file    Non-medical: Not on file  Tobacco Use  . Smoking status: Current Every Day Smoker    Packs/day: 0.50    Types: Cigarettes  . Smokeless tobacco: Never Used  Substance and Sexual Activity  . Alcohol use: Yes    Comment: occ  . Drug use: Not Currently    Types: Marijuana, MDMA (Ecstacy)  . Sexual activity: Yes    Birth control/protection: Condom  Lifestyle  . Physical activity    Days per week: Not on file    Minutes per session: Not on file  . Stress: Not on file  Relationships  . Social Herbalist on phone: Not on file    Gets together: Not on file    Attends religious service:  Not on file    Active member of club or organization: Not on file    Attends meetings of clubs or organizations: Not on file    Relationship status: Not on file  Other Topics Concern  . Not on file  Social History Narrative  . Not on file    Has this patient used any form of tobacco in the last 30 days? (Cigarettes, Smokeless Tobacco, Cigars, and/or Pipes) A prescription for an FDA-approved tobacco cessation medication was offered at discharge and the patient refused  Current Medications: Current Facility-Administered Medications  Medication Dose Route Frequency Provider Last Rate Last Dose  . acetaminophen  (TYLENOL) tablet 650 mg  650 mg Oral Q4H PRN Nanavati, Ankit, MD      . elvitegravir-cobicistat-emtricitabine-tenofovir (GENVOYA) 150-150-200-10 MG tablet 1 tablet  1 tablet Oral Q breakfast Derwood KaplanNanavati, Ankit, MD   1 tablet at 07/14/18 0739  . risperiDONE (RISPERDAL M-TABS) disintegrating tablet 2 mg  2 mg Oral Q8H PRN Derwood KaplanNanavati, Ankit, MD       And  . LORazepam (ATIVAN) tablet 1 mg  1 mg Oral PRN Derwood KaplanNanavati, Ankit, MD       And  . ziprasidone (GEODON) injection 20 mg  20 mg Intramuscular PRN Nanavati, Ankit, MD      . pantoprazole (PROTONIX) EC tablet 40 mg  40 mg Oral Daily Nanavati, Ankit, MD      . ziprasidone (GEODON) capsule 20 mg  20 mg Oral BID WC Nanavati, Ankit, MD      . zolpidem (AMBIEN) tablet 5 mg  5 mg Oral QHS PRN Derwood KaplanNanavati, Ankit, MD       Current Outpatient Medications  Medication Sig Dispense Refill  . omeprazole (PRILOSEC) 40 MG capsule Take 40 mg by mouth daily as needed (heartburn).     . phentermine 15 MG capsule Take 15 mg by mouth daily.    Marland Kitchen. elvitegravir-cobicistat-emtricitabine-tenofovir (GENVOYA) 150-150-200-10 MG TABS tablet Take 1 tablet by mouth daily with breakfast. 30 tablet 0  . loratadine (CLARITIN) 10 MG tablet Take 1 tablet (10 mg total) by mouth daily as needed for allergies. (Patient not taking: Reported on 07/13/2018) 30 tablet 6  . ziprasidone (GEODON) 20 MG capsule Take 1 capsule (20 mg total) by mouth 2 (two) times daily with a meal. (Patient not taking: Reported on 07/13/2018) 14 capsule 0   PTA Medications: (Not in a hospital admission)   Musculoskeletal: Strength & Muscle Tone: within normal limits Gait & Station: normal Patient leans: N/A  Psychiatric Specialty Exam: Physical Exam  Nursing note and vitals reviewed. Constitutional: She is oriented to person, place, and time. She appears well-developed and well-nourished.  HENT:  Head: Normocephalic and atraumatic.  Neck: Normal range of motion.  Respiratory: Effort normal.  Musculoskeletal:  Normal range of motion.  Neurological: She is alert and oriented to person, place, and time.  Psychiatric: She has a normal mood and affect. Her speech is normal and behavior is normal. Judgment and thought content normal. Cognition and memory are normal.    Review of Systems  Psychiatric/Behavioral: Negative for hallucinations, substance abuse and suicidal ideas.  All other systems reviewed and are negative.   Blood pressure (!) 148/93, pulse 74, temperature 98.3 F (36.8 C), temperature source Oral, resp. rate 20, height 5\' 2"  (1.575 m), weight 102.1 kg, SpO2 100 %.Body mass index is 41.15 kg/m.  General Appearance: Casual  Eye Contact:  Good  Speech:  Clear and Coherent and Normal Rate  Volume:  Normal  Mood:  appropriate  Affect:  Appropriate and Congruent  Thought Process:  Coherent and Descriptions of Associations: Intact  Orientation:  Full (Time, Place, and Person)  Thought Content:  WDL  Suicidal Thoughts:  No  Homicidal Thoughts:  No  Memory:  Immediate;   Good Recent;   Good Remote;   Good  Judgement:  Intact  Insight:  Present  Psychomotor Activity:  Normal  Concentration:  Concentration: Good  Recall:  Good  Fund of Knowledge:  Good  Language:  Good  Akathisia:  No  Handed:  Right  AIMS (if indicated):   N/A  Assets:  Housing Physical Health Social Support  ADL's:  Intact  Cognition:  WNL  Sleep:   N/A     Demographic Factors:  NA  Loss Factors: NA  Historical Factors: NA  Risk Reduction Factors:   Religious beliefs about death and Positive social support  Continued Clinical Symptoms:  Previous Psychiatric Diagnoses and Treatments  Cognitive Features That Contribute To Risk:  None    Suicide Risk:  Minimal: No identifiable suicidal ideation.  Patients presenting with no risk factors but with morbid ruminations; may be classified as minimal risk based on the severity of the depressive symptoms    Plan Of Care/Follow-up recommendations:   Activity:  As tolerated Diet:  Heart healthy Other:  follow up with resources given     Recommendations:  Outpatient psychiatric services.  Resources given  Disposition:  Patient psychiatrically cleared. No evidence of imminent risk to self or others at present.   Patient does not meet criteria for psychiatric inpatient admission. Supportive therapy provided about ongoing stressors. Discussed crisis plan, support from social network, calling 911, coming to the Emergency Department, and calling Suicide Hotline.  Barbara Rankin, NP 07/14/2018, 12:34 PM    Patient seen by telemedicine, chart reviewed and case discussed with the physician extender and developed treatment plan. Reviewed the information documented and agree with the treatment plan.  Juanetta BeetsJacqueline Luzelena Heeg, DO 07/14/18 3:31 PM

## 2018-07-26 ENCOUNTER — Emergency Department (HOSPITAL_COMMUNITY)
Admission: EM | Admit: 2018-07-26 | Discharge: 2018-07-28 | Disposition: A | Discharge status: Home / Self Care | Payer: Medicaid Other | Attending: Emergency Medicine | Admitting: Emergency Medicine

## 2018-07-26 ENCOUNTER — Other Ambulatory Visit: Payer: Self-pay

## 2018-07-26 ENCOUNTER — Encounter (HOSPITAL_COMMUNITY): Payer: Self-pay

## 2018-07-26 DIAGNOSIS — I10 Essential (primary) hypertension: Secondary | ICD-10-CM | POA: Diagnosis not present

## 2018-07-26 DIAGNOSIS — Z79899 Other long term (current) drug therapy: Secondary | ICD-10-CM | POA: Insufficient documentation

## 2018-07-26 DIAGNOSIS — R456 Violent behavior: Secondary | ICD-10-CM | POA: Diagnosis present

## 2018-07-26 DIAGNOSIS — F23 Brief psychotic disorder: Secondary | ICD-10-CM | POA: Diagnosis present

## 2018-07-26 DIAGNOSIS — F17210 Nicotine dependence, cigarettes, uncomplicated: Secondary | ICD-10-CM | POA: Diagnosis not present

## 2018-07-26 DIAGNOSIS — F250 Schizoaffective disorder, bipolar type: Secondary | ICD-10-CM | POA: Diagnosis not present

## 2018-07-26 DIAGNOSIS — F4325 Adjustment disorder with mixed disturbance of emotions and conduct: Secondary | ICD-10-CM | POA: Diagnosis not present

## 2018-07-26 DIAGNOSIS — F913 Oppositional defiant disorder: Secondary | ICD-10-CM | POA: Insufficient documentation

## 2018-07-26 DIAGNOSIS — F209 Schizophrenia, unspecified: Secondary | ICD-10-CM | POA: Diagnosis not present

## 2018-07-26 DIAGNOSIS — Z03818 Encounter for observation for suspected exposure to other biological agents ruled out: Secondary | ICD-10-CM | POA: Diagnosis not present

## 2018-07-26 LAB — ETHANOL: Alcohol, Ethyl (B): <10 mg/dL (ref <10)

## 2018-07-26 LAB — CBC WITH DIFFERENTIAL/PLATELET
Abs Immature Granulocytes: 0.01 10*3/uL (ref 0.00–0.07)
Basophils Absolute: 0 10*3/uL (ref 0.0–0.1)
Basophils Relative: 0 %
Eosinophils Absolute: 0 10*3/uL (ref 0.0–0.5)
Eosinophils Relative: 1 %
HCT: 42.1 % (ref 36.0–46.0)
Hemoglobin: 13.4 g/dL (ref 12.0–15.0)
Immature Granulocytes: 0 %
Lymphocytes Relative: 25 %
Lymphs Abs: 1.2 10*3/uL (ref 0.7–4.0)
MCH: 29.2 pg (ref 26.0–34.0)
MCHC: 31.8 g/dL (ref 30.0–36.0)
MCV: 91.7 fL (ref 80.0–100.0)
Monocytes Absolute: 0.4 10*3/uL (ref 0.1–1.0)
Monocytes Relative: 7 %
Neutro Abs: 3.2 10*3/uL (ref 1.7–7.7)
Neutrophils Relative %: 67 %
Platelets: 292 10*3/uL (ref 150–400)
RBC: 4.59 MIL/uL (ref 3.87–5.11)
RDW: 13.7 % (ref 11.5–15.5)
WBC: 4.8 10*3/uL (ref 4.0–10.5)
nRBC: 0 % (ref 0.0–0.2)

## 2018-07-26 LAB — COMPREHENSIVE METABOLIC PANEL
ALT: 22 U/L (ref 0–44)
AST: 22 U/L (ref 15–41)
Albumin: 4 g/dL (ref 3.5–5.0)
Alkaline Phosphatase: 91 U/L (ref 38–126)
Anion gap: 10 (ref 5–15)
BUN: 15 mg/dL (ref 6–20)
CO2: 25 mmol/L (ref 22–32)
Calcium: 9.3 mg/dL (ref 8.9–10.3)
Chloride: 105 mmol/L (ref 98–111)
Creatinine, Ser: 0.65 mg/dL (ref 0.44–1.00)
GFR calc Af Amer: >60 mL/min (ref >60)
GFR calc non Af Amer: >60 mL/min (ref >60)
Glucose, Bld: 94 mg/dL (ref 70–99)
Potassium: 3.4 mmol/L — ABNORMAL LOW (ref 3.5–5.1)
Sodium: 140 mmol/L (ref 135–145)
Total Bilirubin: 0.6 mg/dL (ref 0.3–1.2)
Total Protein: 7.5 g/dL (ref 6.5–8.1)

## 2018-07-26 LAB — SALICYLATE LEVEL: Salicylate Lvl: <7 mg/dL (ref 2.8–30.0)

## 2018-07-26 LAB — ACETAMINOPHEN LEVEL: Acetaminophen (Tylenol), Serum: <10 ug/mL — ABNORMAL LOW (ref 10–30)

## 2018-07-26 LAB — I-STAT BETA HCG BLOOD, ED (MC, WL, AP ONLY): I-stat hCG, quantitative: <5 m[IU]/mL (ref <5)

## 2018-07-26 LAB — SARS CORONAVIRUS 2 BY RT PCR (HOSPITAL ORDER, PERFORMED IN ~~LOC~~ HOSPITAL LAB): SARS Coronavirus 2: NEGATIVE

## 2018-07-26 MED ORDER — HALOPERIDOL LACTATE 5 MG/ML IJ SOLN
10.0000 mg | Freq: Once | INTRAMUSCULAR | Status: DC
  Filled 2018-07-26: qty 2

## 2018-07-26 MED ORDER — BENZTROPINE MESYLATE 1 MG/ML IJ SOLN
1.0000 mg | Freq: Once | INTRAMUSCULAR | Status: DC
  Filled 2018-07-26: qty 2

## 2018-07-26 MED ORDER — PANTOPRAZOLE SODIUM 40 MG PO TBEC
40.0000 mg | DELAYED_RELEASE_TABLET | Freq: Every day | ORAL | Status: DC
  Administered 2018-07-27 – 2018-07-28 (×2): 40 mg via ORAL
  Filled 2018-07-26 (×2): qty 1

## 2018-07-26 MED ORDER — PHENTERMINE HCL 15 MG PO CAPS: 15.0000 mg | ORAL_CAPSULE | Freq: Every day | ORAL | Status: DC

## 2018-07-26 MED ORDER — ZIPRASIDONE MESYLATE 20 MG IM SOLR
20.0000 mg | Freq: Four times a day (QID) | INTRAMUSCULAR | Status: DC | PRN
  Administered 2018-07-26: 20 mg via INTRAMUSCULAR
  Filled 2018-07-26 (×3): qty 20

## 2018-07-26 MED ORDER — ELVITEG-COBIC-EMTRICIT-TENOFAF 150-150-200-10 MG PO TABS
1.0000 | ORAL_TABLET | Freq: Every day | ORAL | Status: DC
  Administered 2018-07-27 – 2018-07-28 (×2): 1 via ORAL
  Filled 2018-07-26 (×2): qty 1

## 2018-07-26 MED ORDER — ZIPRASIDONE HCL 20 MG PO CAPS
20.0000 mg | ORAL_CAPSULE | Freq: Two times a day (BID) | ORAL | Status: DC
  Administered 2018-07-28: 20 mg via ORAL
  Filled 2018-07-26 (×3): qty 1

## 2018-07-26 MED ORDER — LORAZEPAM 1 MG PO TABS
2.0000 mg | ORAL_TABLET | Freq: Once | ORAL | Status: AC
  Administered 2018-07-26: 2 mg via ORAL
  Filled 2018-07-26: qty 2

## 2018-07-26 MED ORDER — STERILE WATER FOR INJECTION IJ SOLN
INTRAMUSCULAR | Status: AC
  Administered 2018-07-26: 09:00:00
  Filled 2018-07-26: qty 10

## 2018-07-26 MED ORDER — ZIPRASIDONE MESYLATE 20 MG IM SOLR
20.0000 mg | Freq: Four times a day (QID) | INTRAMUSCULAR | Status: DC | PRN
  Administered 2018-07-28: 20 mg via INTRAMUSCULAR

## 2018-07-26 MED ORDER — LORATADINE 10 MG PO TABS: 10.0000 mg | ORAL_TABLET | Freq: Every day | ORAL | Status: DC | PRN

## 2018-07-26 NOTE — ED Notes (Signed)
Pt sleeping, easily aroused, agreed to take medication, eating supper

## 2018-07-26 NOTE — ED Notes (Signed)
PT ambulatory from [facility] to room 35

## 2018-07-26 NOTE — ED Notes (Signed)
Pt is now lying down with her eyes closed. Pt agrees to have blood drawn at this time.
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
## 2018-07-26 NOTE — ED Notes (Signed)
Pt. Received tray. Nurse aware.

## 2018-07-26 NOTE — ED Notes (Signed)
WILL COLLECT URINE AND BLOOD WHEN PT. CALMS DOWN. NURSE AWARE.

## 2018-07-26 NOTE — ED Notes (Signed)
Pt sleeping. 
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
## 2018-07-26 NOTE — ED Notes (Signed)
Pt sleeping at present, no distress noted, calm at present.  Monitoring for safety. 

## 2018-07-26 NOTE — BH Assessment (Signed)
Pt has been given Geodon and is unable to be assessed at this time.
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
## 2018-07-26 NOTE — BH Assessment (Signed)
3 phone calls over 45 minutes requesting TTS cart to be placed in pt's room. Per Santiago Glad, pt is now sleeping & unable to be assessed. Request for notification when pt wakes.
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
## 2018-07-26 NOTE — ED Notes (Signed)
Pt. Refuses to have vitals done. Will collect vitals when pt. Calms down. Nurse aware.
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
## 2018-07-27 LAB — RAPID URINE DRUG SCREEN, HOSP PERFORMED
Amphetamines: NOT DETECTED
Barbiturates: NOT DETECTED
Benzodiazepines: POSITIVE — AB
Cocaine: NOT DETECTED
Opiates: NOT DETECTED
Tetrahydrocannabinol: NOT DETECTED

## 2018-07-27 LAB — URINALYSIS, ROUTINE W REFLEX MICROSCOPIC
Bacteria, UA: NONE SEEN
Bilirubin Urine: NEGATIVE
Glucose, UA: NEGATIVE mg/dL
Hgb urine dipstick: NEGATIVE
Ketones, ur: NEGATIVE mg/dL
Leukocytes,Ua: NEGATIVE
Nitrite: NEGATIVE
Protein, ur: NEGATIVE mg/dL
Specific Gravity, Urine: 1.019 (ref 1.005–1.030)
pH: 6 (ref 5.0–8.0)

## 2018-07-27 MED ORDER — NICOTINE 21 MG/24HR TD PT24
21.0000 mg | MEDICATED_PATCH | Freq: Once | TRANSDERMAL | Status: DC
  Administered 2018-07-27: 21 mg via TRANSDERMAL
  Filled 2018-07-27: qty 1

## 2018-07-27 NOTE — Progress Notes (Signed)
Per Burns Spain, NP patient meets criteria for inpatient treatment. [facility] at capacity currently. CSW faxed referrals to the following facilities for review:  [facility], [facility], [facility], [facility] [facility], [place], [place], [place], [place], [facility] [place], [facility], [facility], [place], [facility], [place], [place], [place], [place], [place].  TTS will continue to seek bed placement.   Maxie Better, MSW, LCSW Clinical Social Worker 07/27/2018 4:17 PM
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
## 2018-07-27 NOTE — ED Notes (Signed)
Patient agreed to take shower and is in the bathroom now.

## 2018-07-27 NOTE — ED Notes (Signed)
Lunch tray given. 

## 2018-07-27 NOTE — ED Notes (Signed)
Patient took shower and now is walking the halls and standing at the nurse's window.

## 2018-07-27 NOTE — ED Notes (Signed)
Patient on unit talking to new patient and wandering around. Patient pulling up her top and placing hands on her belly around one of the new female patients and engaging in conversation with him.  Pt requested a ginger ale and given a ginger ale.

## 2018-07-27 NOTE — ED Notes (Signed)
Patient requesting food. RN gave patient a sandwich, cheese stick, crackers, and caffeine free coke

## 2018-07-27 NOTE — ED Notes (Signed)
Patient is requesting someone come up to the ER and discharge her. RN explained to patient that she is under IVC and cannot be allowed to leave until cleared by a doctor. Patient has been calm and cooperative since this RN came on shift.  Pt is alert and oriented and behaving appropriately.  Patient did not get upset at RN explanation and states her hope is that she will be discharged tomorrow. Pt denies SI/HI.

## 2018-07-27 NOTE — ED Notes (Signed)
Snack given.

## 2018-07-27 NOTE — ED Notes (Signed)
Breakfast tray given. °

## 2018-07-27 NOTE — ED Notes (Signed)
Patient came up to window and asked nurse whether she was going to be discharged.  Nurse told her the doctor wanted to observe her for another 24 hours then would make the determination if she would be discharged or need inpatient treatment.  Patient took this news calmly and asked for a snack.  Sandwich and coke given.

## 2018-07-27 NOTE — ED Notes (Addendum)
Patient refused Geodon saying her doctor was getting ready to take her off that before she came into the hospital.  Patient reports she is getting evicted today and needs to be discharged as soon as possible.  She refused shower when asked.  Patient asked if she could have something for salmonella. "it is starting to come out all over."  Patient denies SI, HI, and AVH.
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
## 2018-07-27 NOTE — ED Notes (Signed)
Patient pacing back and forth in front of nurse's station.  She keeps asking when she is going to be discharged because she needs to pay a bill so she won't be evicted.

## 2018-07-27 NOTE — ED Notes (Signed)
Patient feels like it is okay to go home today and does not seem to have insight into her mental health condition.

## 2018-07-28 ENCOUNTER — Ambulatory Visit: Payer: Medicaid Other | Admitting: Obstetrics

## 2018-07-28 ENCOUNTER — Encounter (HOSPITAL_COMMUNITY): Payer: Self-pay | Admitting: Registered Nurse

## 2018-07-28 DIAGNOSIS — F4325 Adjustment disorder with mixed disturbance of emotions and conduct: Secondary | ICD-10-CM

## 2018-07-28 DIAGNOSIS — F209 Schizophrenia, unspecified: Secondary | ICD-10-CM

## 2018-07-28 MED ORDER — STERILE WATER FOR INJECTION IJ SOLN
INTRAMUSCULAR | Status: AC
  Administered 2018-07-28: 07:00:00
  Filled 2018-07-28: qty 10

## 2018-07-28 NOTE — ED Notes (Signed)
Stretcher locked and in low position,Side rails upright. Call light given and within reach.  Comfort measures provided. °

## 2018-07-28 NOTE — Discharge Instructions (Signed)
For your behavioral health needs, you are advised to continue treatment with your current providers through the [facility].
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
## 2018-07-28 NOTE — Consult Note (Addendum)
[facility]  07/28/2018 1:53 PM Barbara Benton  MRN:  [id number] Principal Problem: Adjustment disorder with mixed disturbance of emotions and conduct Discharge Diagnoses: Principal Problem:   Adjustment disorder with mixed disturbance of emotions and conduct Active Problems:   Schizophrenia, acute (HCC)   Subjective: Patient seen via tele psych by this provider, Dr. Sharma CovertNorman; and  chart reviewed on 07/28/18.  On evaluation Barbara RodriguezHeaven Fiorentino reports she was upset yesterday and didn't take her medications.  "When ever I get upset I get anxious."  Patient states that she is feeling much calmer today "I feel wonderful"  Patient denies suicidal/self-harm/homicidal ideation, psychosis, and paranoia.  Patient states that she is ready to go home.  During evaluation Barbara RodriguezHeaven Sann is sitting on side of bed; she is alert/oriented x 4; calm/cooperative; and mood congruent with affect.  Patient is speaking in a clear tone at moderate volume, and normal pace; with good eye contact.  Her thought process is coherent and relevant; There is no indication that she is currently responding to internal/external stimuli or experiencing delusional thought content.  Patient denies suicidal/self-harm/homicidal ideation, psychosis, and paranoia.  Patient has remained calm throughout assessment and has answered questions appropriately.   Total Time spent with patient: 30 minutes  Past Psychiatric History: Schizophrenia, depression, anxiety, ADHD and BPAD.  Past Medical History:  Past Medical History:  Diagnosis Date  . ADHD (attention deficit hyperactivity disorder)   . Anxiety   . Bipolar disorder (HCC)   . Depression   . GERD (gastroesophageal reflux disease)   . Hypertension   . ODD (oppositional defiant disorder)     Past Surgical History:  Procedure Laterality Date  . NO PAST SURGERIES     Family History:  Family History  Problem Relation Age of Onset  . Diabetes Maternal Grandmother   . Cancer  Paternal Grandfather        type unknown   Family Psychiatric  History: Aunt-anxiety  Social History:  Social History   Substance and Sexual Activity  Alcohol Use Yes   Comment: occ     Social History   Substance and Sexual Activity  Drug Use Not Currently  . Types: Marijuana, MDMA (Ecstacy)    Social History   Socioeconomic History  . Marital status: Single    Spouse name: Not on file  . Number of children: 0  . Years of education: Not on file  . Highest education level: Not on file  Occupational History  . Occupation: Advertising account plannerinsurance agent  Social Needs  . Financial resource strain: Not on file  . Food insecurity    Worry: Not on file    Inability: Not on file  . Transportation needs    Medical: Not on file    Non-medical: Not on file  Tobacco Use  . Smoking status: Current Every Day Smoker    Packs/day: 0.50    Types: Cigarettes  . Smokeless tobacco: Never Used  Substance and Sexual Activity  . Alcohol use: Yes    Comment: occ  . Drug use: Not Currently    Types: Marijuana, MDMA (Ecstacy)  . Sexual activity: Yes    Birth control/protection: Condom  Lifestyle  . Physical activity    Days per week: Not on file    Minutes per session: Not on file  . Stress: Not on file  Relationships  . Social Musicianconnections    Talks on phone: Not on file    Gets together: Not on file    Attends religious  service: Not on file    Active member of club or organization: Not on file    Attends meetings of clubs or organizations: Not on file    Relationship status: Not on file  Other Topics Concern  . Not on file  Social History Narrative  . Not on file    Has this patient used any form of tobacco in the last 30 days? (Cigarettes, Smokeless Tobacco, Cigars, and/or Pipes) A prescription for an FDA-approved tobacco cessation medication was offered at discharge and the patient refused  Current Medications: Current Facility-Administered Medications  Medication Dose Route Frequency  Provider Last Rate Last Dose  . benztropine mesylate (COGENTIN) injection 1 mg  1 mg Intramuscular Once Gerhard MunchLockwood, Robert, MD      . elvitegravir-cobicistat-emtricitabine-tenofovir (GENVOYA) 150-150-200-10 MG tablet 1 tablet  1 tablet Oral Q breakfast Gerhard MunchLockwood, Robert, MD   1 tablet at 07/28/18 1018  . haloperidol lactate (HALDOL) injection 10 mg  10 mg Intramuscular Once Gerhard MunchLockwood, Robert, MD   Stopped at 07/26/18 1007  . loratadine (CLARITIN) tablet 10 mg  10 mg Oral Daily PRN Gerhard MunchLockwood, Robert, MD      . nicotine (NICODERM CQ - dosed in mg/24 hours) patch 21 mg  21 mg Transdermal Once Lorre NickAllen, Anthony, MD   21 mg at 07/27/18 2211  . pantoprazole (PROTONIX) EC tablet 40 mg  40 mg Oral Daily Gerhard MunchLockwood, Robert, MD   40 mg at 07/28/18 1110  . ziprasidone (GEODON) capsule 20 mg  20 mg Oral BID WC Gerhard MunchLockwood, Robert, MD   20 mg at 07/28/18 1017  . ziprasidone (GEODON) injection 20 mg  20 mg Intramuscular Q6H PRN Gerhard MunchLockwood, Robert, MD   20 mg at 07/26/18 0900  . ziprasidone (GEODON) injection 20 mg  20 mg Intramuscular Q6H PRN Gerhard MunchLockwood, Robert, MD   20 mg at 07/28/18 0708   Current Outpatient Medications  Medication Sig Dispense Refill  . omeprazole (PRILOSEC) 40 MG capsule Take 40 mg by mouth daily as needed (heartburn).     . phentermine 15 MG capsule Take 15 mg by mouth daily.    Marland Kitchen. elvitegravir-cobicistat-emtricitabine-tenofovir (GENVOYA) 150-150-200-10 MG TABS tablet Take 1 tablet by mouth daily with breakfast. 30 tablet 0  . loratadine (CLARITIN) 10 MG tablet Take 1 tablet (10 mg total) by mouth daily as needed for allergies. (Patient not taking: Reported on 07/13/2018) 30 tablet 6  . ziprasidone (GEODON) 20 MG capsule Take 1 capsule (20 mg total) by mouth 2 (two) times daily with a meal. (Patient not taking: Reported on 07/13/2018) 14 capsule 0   PTA Medications: (Not in a hospital admission)   Musculoskeletal: Strength & Muscle Tone: within normal limits Gait & Station: normal Patient leans:  N/A  Psychiatric Specialty Exam: Physical Exam  Nursing note and vitals reviewed. Constitutional: She is oriented to person, place, and time. She appears well-developed and well-nourished. No distress.  HENT:  Head: Normocephalic and atraumatic.  Neck: Normal range of motion.  Respiratory: Effort normal.  Musculoskeletal: Normal range of motion.  Neurological: She is alert and oriented to person, place, and time.  Psychiatric: She has a normal mood and affect. Her speech is normal and behavior is normal. Judgment and thought content normal.    Review of Systems  Psychiatric/Behavioral: Depression: Stable. Hallucinations: Denies. Suicidal ideas: Denies.  All other systems reviewed and are negative.   Blood pressure 126/76, pulse 84, temperature (!) 97.5 F (36.4 C), temperature source Oral, resp. rate 18, height 5\' 2"  (1.575 m), weight 102.1 kg,  SpO2 100 %.Body mass index is 41.17 kg/m.  General Appearance: Casual  Eye Contact:  Good  Speech:  Clear and Coherent and Normal Rate  Volume:  Normal  Mood:  Appropriate  Affect:  Appropriate and Congruent  Thought Process:  Coherent, Goal Directed and Descriptions of Associations: Intact  Orientation:  Full (Time, Place, and Person)  Thought Content:  WDL  Suicidal Thoughts:  No  Homicidal Thoughts:  No  Memory:  Immediate;   Good Recent;   Good Remote;   Good  Judgement:  Intact  Insight:  Present  Psychomotor Activity:  Normal  Concentration:  Concentration: Good and Attention Span: Good  Recall:  Good  Fund of Knowledge:  Good  Language:  Good  Akathisia:  No  Handed:  Right  AIMS (if indicated):   N/A  Assets:  Communication Skills Desire for Improvement Housing Social Support  ADL's:  Intact  Cognition:  WNL  Sleep:   N/A     Demographic Factors:  Living alone  Loss Factors: NA  Historical Factors: NA  Risk Reduction Factors:   Sense of responsibility to family and Positive social support  Continued  Clinical Symptoms:  Alcohol/Substance Abuse/Dependencies Previous Psychiatric Diagnoses and Treatments  Cognitive Features That Contribute To Risk:  None    Suicide Risk:  Minimal: No identifiable suicidal ideation.  Patients presenting with no risk factors but with morbid ruminations; may be classified as minimal risk based on the severity of the depressive symptoms    Plan Of Care/Follow-up recommendations:  Activity:  As tolerated Diet:  Heart healthy Other:  Follow up with outpatient psychiatric provider   Disposition: No evidence of imminent risk to self or others at present.   Patient does not meet criteria for psychiatric inpatient admission. Supportive therapy provided about ongoing stressors. Discussed crisis plan, support from social network, calling 911, coming to the Emergency Department, and calling Suicide Hotline.  Shuvon Rankin, NP 07/28/2018, 1:53 PM    Patient seen by telemedicine for psychiatric evaluation, chart reviewed and case discussed with the physician extender and developed treatment plan. Reviewed the information documented and agree with the treatment plan.  Buford Dresser, DO 07/28/18 5:31 PM

## 2018-07-28 NOTE — ED Notes (Signed)
Pt discharged safely.  Discharge instructions were reviewed.  All belongings were returned to pt.

## 2018-08-02 ENCOUNTER — Emergency Department (HOSPITAL_COMMUNITY)
Admission: EM | Admit: 2018-08-02 | Discharge: 2018-08-02 | Disposition: A | Discharge status: Home / Self Care | Payer: Medicaid Other | Attending: Emergency Medicine | Admitting: Emergency Medicine

## 2018-08-02 ENCOUNTER — Other Ambulatory Visit: Payer: Self-pay

## 2018-08-02 ENCOUNTER — Encounter (HOSPITAL_COMMUNITY): Payer: Self-pay | Admitting: Emergency Medicine

## 2018-08-02 DIAGNOSIS — F17210 Nicotine dependence, cigarettes, uncomplicated: Secondary | ICD-10-CM | POA: Insufficient documentation

## 2018-08-02 DIAGNOSIS — I10 Essential (primary) hypertension: Secondary | ICD-10-CM | POA: Diagnosis not present

## 2018-08-02 DIAGNOSIS — Z79899 Other long term (current) drug therapy: Secondary | ICD-10-CM | POA: Insufficient documentation

## 2018-08-02 DIAGNOSIS — R451 Restlessness and agitation: Secondary | ICD-10-CM | POA: Diagnosis present

## 2018-08-02 DIAGNOSIS — F4325 Adjustment disorder with mixed disturbance of emotions and conduct: Secondary | ICD-10-CM | POA: Diagnosis not present
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
## 2018-08-02 NOTE — ED Triage Notes (Signed)
Pt brought in by [employer] for SI. Pt aggressive and verbally abusive with [employer].

## 2018-08-03 ENCOUNTER — Other Ambulatory Visit: Payer: Self-pay | Admitting: Family Medicine

## 2018-08-03 DIAGNOSIS — R1084 Generalized abdominal pain: Secondary | ICD-10-CM

## 2018-08-03 DIAGNOSIS — M549 Dorsalgia, unspecified: Secondary | ICD-10-CM

## 2018-08-03 DIAGNOSIS — G8929 Other chronic pain: Secondary | ICD-10-CM

## 2018-08-11 ENCOUNTER — Other Ambulatory Visit: Payer: Self-pay

## 2018-08-11 ENCOUNTER — Emergency Department (HOSPITAL_COMMUNITY)
Admission: EM | Admit: 2018-08-11 | Discharge: 2018-08-12 | Disposition: A | Discharge status: Home / Self Care | Payer: Medicaid Other | Attending: Emergency Medicine | Admitting: Emergency Medicine

## 2018-08-11 ENCOUNTER — Observation Stay (HOSPITAL_COMMUNITY): Admission: AD | Admit: 2018-08-11 | Payer: Medicaid Other | Source: Intra-hospital | Admitting: Psychiatry

## 2018-08-11 DIAGNOSIS — F209 Schizophrenia, unspecified: Secondary | ICD-10-CM | POA: Insufficient documentation

## 2018-08-11 DIAGNOSIS — Z046 Encounter for general psychiatric examination, requested by authority: Secondary | ICD-10-CM | POA: Diagnosis present

## 2018-08-11 DIAGNOSIS — F319 Bipolar disorder, unspecified: Secondary | ICD-10-CM | POA: Diagnosis not present

## 2018-08-11 DIAGNOSIS — Z653 Problems related to other legal circumstances: Secondary | ICD-10-CM | POA: Diagnosis not present

## 2018-08-11 DIAGNOSIS — I10 Essential (primary) hypertension: Secondary | ICD-10-CM | POA: Diagnosis not present

## 2018-08-11 DIAGNOSIS — F29 Unspecified psychosis not due to a substance or known physiological condition: Secondary | ICD-10-CM | POA: Insufficient documentation

## 2018-08-11 DIAGNOSIS — Z20828 Contact with and (suspected) exposure to other viral communicable diseases: Secondary | ICD-10-CM | POA: Diagnosis not present

## 2018-08-11 DIAGNOSIS — F4325 Adjustment disorder with mixed disturbance of emotions and conduct: Secondary | ICD-10-CM | POA: Diagnosis present

## 2018-08-11 LAB — CBC WITH DIFFERENTIAL/PLATELET
Abs Immature Granulocytes: 0.03 10*3/uL (ref 0.00–0.07)
Basophils Absolute: 0 10*3/uL (ref 0.0–0.1)
Basophils Relative: 0 %
Eosinophils Absolute: 0.1 10*3/uL (ref 0.0–0.5)
Eosinophils Relative: 1 %
HCT: 42.4 % (ref 36.0–46.0)
Hemoglobin: 13.1 g/dL (ref 12.0–15.0)
Immature Granulocytes: 0 %
Lymphocytes Relative: 21 %
Lymphs Abs: 2 10*3/uL (ref 0.7–4.0)
MCH: 29.1 pg (ref 26.0–34.0)
MCHC: 30.9 g/dL (ref 30.0–36.0)
MCV: 94.2 fL (ref 80.0–100.0)
Monocytes Absolute: 0.7 10*3/uL (ref 0.1–1.0)
Monocytes Relative: 7 %
Neutro Abs: 6.7 10*3/uL (ref 1.7–7.7)
Neutrophils Relative %: 71 %
Platelets: 293 10*3/uL (ref 150–400)
RBC: 4.5 MIL/uL (ref 3.87–5.11)
RDW: 14 % (ref 11.5–15.5)
WBC: 9.5 10*3/uL (ref 4.0–10.5)
nRBC: 0 % (ref 0.0–0.2)

## 2018-08-11 LAB — URINALYSIS, ROUTINE W REFLEX MICROSCOPIC
Bacteria, UA: NONE SEEN
Bilirubin Urine: NEGATIVE
Glucose, UA: NEGATIVE mg/dL
Ketones, ur: NEGATIVE mg/dL
Leukocytes,Ua: NEGATIVE
Nitrite: NEGATIVE
Protein, ur: NEGATIVE mg/dL
RBC / HPF: >50 RBC/hpf — ABNORMAL HIGH (ref 0–5)
Specific Gravity, Urine: 1.024 (ref 1.005–1.030)
pH: 5 (ref 5.0–8.0)

## 2018-08-11 LAB — COMPREHENSIVE METABOLIC PANEL
ALT: 26 U/L (ref 0–44)
AST: 24 U/L (ref 15–41)
Albumin: 3.9 g/dL (ref 3.5–5.0)
Alkaline Phosphatase: 93 U/L (ref 38–126)
Anion gap: 9 (ref 5–15)
BUN: 14 mg/dL (ref 6–20)
CO2: 27 mmol/L (ref 22–32)
Calcium: 9.3 mg/dL (ref 8.9–10.3)
Chloride: 105 mmol/L (ref 98–111)
Creatinine, Ser: 0.8 mg/dL (ref 0.44–1.00)
GFR calc Af Amer: >60 mL/min (ref >60)
GFR calc non Af Amer: >60 mL/min (ref >60)
Glucose, Bld: 77 mg/dL (ref 70–99)
Potassium: 4.2 mmol/L (ref 3.5–5.1)
Sodium: 141 mmol/L (ref 135–145)
Total Bilirubin: 0.4 mg/dL (ref 0.3–1.2)
Total Protein: 7.5 g/dL (ref 6.5–8.1)

## 2018-08-11 LAB — RAPID URINE DRUG SCREEN, HOSP PERFORMED
Amphetamines: NOT DETECTED
Barbiturates: NOT DETECTED
Benzodiazepines: NOT DETECTED
Cocaine: NOT DETECTED
Opiates: NOT DETECTED
Tetrahydrocannabinol: NOT DETECTED

## 2018-08-11 LAB — I-STAT BETA HCG BLOOD, ED (MC, WL, AP ONLY): I-stat hCG, quantitative: <5 m[IU]/mL (ref <5)

## 2018-08-11 LAB — SARS CORONAVIRUS 2 BY RT PCR (HOSPITAL ORDER, PERFORMED IN ~~LOC~~ HOSPITAL LAB): SARS Coronavirus 2: NEGATIVE

## 2018-08-11 LAB — ETHANOL: Alcohol, Ethyl (B): <10 mg/dL (ref <10)

## 2018-08-11 MED ORDER — DIPHENHYDRAMINE HCL 25 MG PO CAPS: 50.0000 mg | ORAL_CAPSULE | Freq: Four times a day (QID) | ORAL | Status: DC | PRN

## 2018-08-11 MED ORDER — LORAZEPAM 1 MG PO TABS: 2.0000 mg | ORAL_TABLET | Freq: Four times a day (QID) | ORAL | Status: DC | PRN

## 2018-08-11 MED ORDER — ZIPRASIDONE MESYLATE 20 MG IM SOLR
20.0000 mg | Freq: Once | INTRAMUSCULAR | Status: AC
  Administered 2018-08-11: 20 mg via INTRAMUSCULAR
  Filled 2018-08-11: qty 20

## 2018-08-11 MED ORDER — HALOPERIDOL LACTATE 5 MG/ML IJ SOLN: 10.0000 mg | Freq: Four times a day (QID) | INTRAMUSCULAR | Status: DC | PRN

## 2018-08-11 MED ORDER — ZIPRASIDONE HCL 20 MG PO CAPS
20.0000 mg | ORAL_CAPSULE | Freq: Two times a day (BID) | ORAL | Status: DC
  Filled 2018-08-11: qty 1

## 2018-08-11 MED ORDER — PANTOPRAZOLE SODIUM 40 MG PO TBEC
40.0000 mg | DELAYED_RELEASE_TABLET | Freq: Every day | ORAL | Status: DC
  Filled 2018-08-11: qty 1

## 2018-08-11 MED ORDER — STERILE WATER FOR INJECTION IJ SOLN
INTRAMUSCULAR | Status: AC
  Administered 2018-08-11: 10 mL
  Filled 2018-08-11: qty 10

## 2018-08-11 MED ORDER — ACETAMINOPHEN 325 MG PO TABS: 650.0000 mg | ORAL_TABLET | Freq: Four times a day (QID) | ORAL | Status: DC | PRN

## 2018-08-11 MED ORDER — LORAZEPAM 2 MG/ML IJ SOLN: 2.0000 mg | Freq: Four times a day (QID) | INTRAMUSCULAR | Status: DC | PRN

## 2018-08-11 MED ORDER — ELVITEG-COBIC-EMTRICIT-TENOFAF 150-150-200-10 MG PO TABS
1.0000 | ORAL_TABLET | Freq: Every day | ORAL | Status: DC
  Filled 2018-08-11: qty 1

## 2018-08-11 MED ORDER — DIPHENHYDRAMINE HCL 50 MG/ML IJ SOLN: 50.0000 mg | Freq: Four times a day (QID) | INTRAMUSCULAR | Status: DC | PRN

## 2018-08-11 MED ORDER — HALOPERIDOL 5 MG PO TABS: 10.0000 mg | ORAL_TABLET | Freq: Four times a day (QID) | ORAL | Status: DC | PRN

## 2018-08-11 NOTE — ED Notes (Addendum)
Pt calm, cooperative, lt leg restrain removed., skin normal/warm no breakdown noted, pos pulse.
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
## 2018-08-11 NOTE — ED Notes (Signed)
Sitting quielty, nad

## 2018-08-11 NOTE — ED Notes (Signed)
[place], restraints intact, reasurred., pt declines PO fluids
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
## 2018-08-11 NOTE — ED Notes (Addendum)
Pt remains calm, cooperative, rt ankle restraint removed, skin intact/warm, no skin breakdown noted, pos pulses. PO fluids offered.
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
## 2018-08-11 NOTE — ED Notes (Signed)
TTS eval complete

## 2018-08-11 NOTE — ED Provider Notes (Signed)
Patient seen by Dr. Vallery Ridge and signed out to me.  She is here with erratic behavior on an IVC. Physical Exam  BP (!) 151/93 (BP Location: Left Arm)   Pulse 72   Temp 98.3 F (36.8 C) (Oral)   Resp 20   SpO2 100%   Physical Exam  ED Course/Procedures     Procedures  MDM  Plan is for behavioral health consult.       Hayden Rasmussen, MD 08/12/18 1255

## 2018-08-11 NOTE — ED Notes (Addendum)
Pt calm cooperative, rt wrist restraint removed, normal color/skin warm, movement, no skin breakdown noted/ pos pulse
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
## 2018-08-11 NOTE — Progress Notes (Signed)
Received Barbara Benton this PM at the change of shift as she was being medicated with Geodon and in 4 point restraints. Security is present. She eventually drifted off to sleep and was slowly released from the restraints one limb at a time. Her VS were check, the sitter is at the bedside and no distress noted. She slept throughout the night.

## 2018-08-11 NOTE — ED Notes (Addendum)
Pt restrained to bed with ssoft restraints, continues to yell and scream, security present. Unable to calm
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
## 2018-08-11 NOTE — ED Notes (Signed)
Pharmacy tech into see 

## 2018-08-11 NOTE — BH Assessment (Addendum)
[place] Assessment Progress Note Per [name] DO patient will be observed and monitored for safety. Psychiatry to see in the a.m.

## 2018-08-11 NOTE — ED Notes (Signed)
Lab here to draw blood.

## 2018-08-11 NOTE — ED Notes (Signed)
Unable to draw blood (diane RN) lab contacted

## 2018-08-11 NOTE — ED Notes (Addendum)
tts in to see and discuss pt being transferred to OBS unit. Pt began to yell at TTS

## 2018-08-12 ENCOUNTER — Encounter (HOSPITAL_COMMUNITY): Payer: Self-pay | Admitting: Registered Nurse

## 2018-08-12 DIAGNOSIS — F4325 Adjustment disorder with mixed disturbance of emotions and conduct: Secondary | ICD-10-CM

## 2018-08-12 DIAGNOSIS — Z653 Problems related to other legal circumstances: Secondary | ICD-10-CM
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
## 2018-08-12 NOTE — Discharge Instructions (Signed)
For your behavioral health needs, you are advised to continue treatment through the [facility].  As an alternative, contact [facility] and ask about scheduling an intake appointment:       [facility]      [address]      [place], [place] [address]      [phone]      Crisis number: [phone]
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
## 2018-08-12 NOTE — BH Assessment (Signed)
[facility] Assessment Progress Note  Per Buford Dresser, DO, this pt does not require psychiatric hospitalization at this time.  Pt presents under IVC initiated by EDP Charlesetta Shanks, which Dr [name] has rescinded.  Pt is to be discharged from [facility] with outpatient referrals. Discharge instructions advise pt to continue treatment through the [facility], or as an alternative, with [facility].  Pt's nurse, Eustaquio Maize, has been notified.  Jalene Mullet, [place] Triage Specialist [phone]

## 2018-09-03 ENCOUNTER — Ambulatory Visit: Payer: Self-pay | Admitting: Family Medicine

## 2018-09-09 ENCOUNTER — Other Ambulatory Visit: Payer: Self-pay

## 2018-09-09 ENCOUNTER — Emergency Department (HOSPITAL_COMMUNITY)
Admission: EM | Admit: 2018-09-09 | Discharge: 2018-09-10 | Disposition: A | Discharge status: Home / Self Care | Payer: Medicaid Other | Attending: Emergency Medicine | Admitting: Emergency Medicine

## 2018-09-09 DIAGNOSIS — F319 Bipolar disorder, unspecified: Secondary | ICD-10-CM | POA: Insufficient documentation

## 2018-09-09 DIAGNOSIS — R4689 Other symptoms and signs involving appearance and behavior: Secondary | ICD-10-CM

## 2018-09-09 DIAGNOSIS — Z008 Encounter for other general examination: Secondary | ICD-10-CM | POA: Insufficient documentation

## 2018-09-09 DIAGNOSIS — Z77098 Contact with and (suspected) exposure to other hazardous, chiefly nonmedicinal, chemicals: Secondary | ICD-10-CM | POA: Diagnosis not present

## 2018-09-09 DIAGNOSIS — F17210 Nicotine dependence, cigarettes, uncomplicated: Secondary | ICD-10-CM | POA: Diagnosis not present

## 2018-09-09 DIAGNOSIS — I10 Essential (primary) hypertension: Secondary | ICD-10-CM | POA: Insufficient documentation

## 2018-09-09 DIAGNOSIS — R208 Other disturbances of skin sensation: Secondary | ICD-10-CM | POA: Insufficient documentation

## 2018-09-09 LAB — I-STAT BETA HCG BLOOD, ED (MC, WL, AP ONLY): I-stat hCG, quantitative: <5 m[IU]/mL (ref <5)

## 2018-09-09 LAB — CBC WITH DIFFERENTIAL/PLATELET
Abs Immature Granulocytes: 0.03 10*3/uL (ref 0.00–0.07)
Basophils Absolute: 0 10*3/uL (ref 0.0–0.1)
Basophils Relative: 0 %
Eosinophils Absolute: 0 10*3/uL (ref 0.0–0.5)
Eosinophils Relative: 0 %
HCT: 40.6 % (ref 36.0–46.0)
Hemoglobin: 13.1 g/dL (ref 12.0–15.0)
Immature Granulocytes: 0 %
Lymphocytes Relative: 15 %
Lymphs Abs: 1.6 10*3/uL (ref 0.7–4.0)
MCH: 29.5 pg (ref 26.0–34.0)
MCHC: 32.3 g/dL (ref 30.0–36.0)
MCV: 91.4 fL (ref 80.0–100.0)
Monocytes Absolute: 0.7 10*3/uL (ref 0.1–1.0)
Monocytes Relative: 7 %
Neutro Abs: 8.8 10*3/uL — ABNORMAL HIGH (ref 1.7–7.7)
Neutrophils Relative %: 78 %
Platelets: 304 10*3/uL (ref 150–400)
RBC: 4.44 MIL/uL (ref 3.87–5.11)
RDW: 13.3 % (ref 11.5–15.5)
WBC: 11.2 10*3/uL — ABNORMAL HIGH (ref 4.0–10.5)
nRBC: 0 % (ref 0.0–0.2)

## 2018-09-09 LAB — COMPREHENSIVE METABOLIC PANEL
ALT: 40 U/L (ref 0–44)
AST: 19 U/L (ref 15–41)
Albumin: 3.8 g/dL (ref 3.5–5.0)
Alkaline Phosphatase: 95 U/L (ref 38–126)
Anion gap: 7 (ref 5–15)
BUN: 14 mg/dL (ref 6–20)
CO2: 25 mmol/L (ref 22–32)
Calcium: 8.8 mg/dL — ABNORMAL LOW (ref 8.9–10.3)
Chloride: 108 mmol/L (ref 98–111)
Creatinine, Ser: 0.74 mg/dL (ref 0.44–1.00)
GFR calc Af Amer: >60 mL/min (ref >60)
GFR calc non Af Amer: >60 mL/min (ref >60)
Glucose, Bld: 89 mg/dL (ref 70–99)
Potassium: 3.6 mmol/L (ref 3.5–5.1)
Sodium: 140 mmol/L (ref 135–145)
Total Bilirubin: 0.6 mg/dL (ref 0.3–1.2)
Total Protein: 7.1 g/dL (ref 6.5–8.1)

## 2018-09-09 LAB — RAPID URINE DRUG SCREEN, HOSP PERFORMED
Amphetamines: NOT DETECTED
Barbiturates: NOT DETECTED
Benzodiazepines: POSITIVE — AB
Cocaine: NOT DETECTED
Opiates: NOT DETECTED
Tetrahydrocannabinol: NOT DETECTED

## 2018-09-09 LAB — ETHANOL: Alcohol, Ethyl (B): <10 mg/dL (ref <10)

## 2018-09-09 LAB — ACETAMINOPHEN LEVEL: Acetaminophen (Tylenol), Serum: <10 ug/mL — ABNORMAL LOW (ref 10–30)
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
## 2018-09-09 NOTE — Progress Notes (Signed)
09/09/2018 1805  Lab draw attemptted unsuccessful. Call lab to come draw labs [phone]. Per lab they will be here shortly.
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
## 2018-09-09 NOTE — BH Assessment (Signed)
[facility] Assessment Progress Note   Clinician attempted to see patient but was informed that patient was too sleepy to be assessed. She was given 5mg  Versed and 5mg  Haldol prior to arrival.

## 2018-09-10 ENCOUNTER — Encounter (HOSPITAL_COMMUNITY): Payer: Self-pay | Admitting: Registered Nurse

## 2018-09-10 NOTE — BH Assessment (Signed)
[facility] Assessment Progress Note  Per Buford Dresser, DO, this pt does not require psychiatric hospitalization at this time.  Pt is to be discharged from [facility] with outpatient resources.  Pt may be eligible for ACT Team services at this time.  Referral information for the [facility] Team providers has been included in pt's discharge instructions.  Pt's nurse, Neoma Laming, has been notified.  Jalene Mullet, [place] Triage Specialist [phone]
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
## 2018-09-10 NOTE — Discharge Instructions (Signed)
For your behavioral health needs, you are advised to follow up with an outpatient provider.  You may be eligible for ACT Team services, which would include more frequent visits with your provider, as well as in-home services.  The following providers offer ACT Team services.  Contact them at your earliest opportunity to ask about enrolling in their program:       [facility]      [address]      [place], [place] [address]      [phone]       [facility]      [address]      [place], [place] [address]      [phone]       [facility] ACT Team      [facility]      [address]      [place], [place]  [address]      [phone]       [facility]      [address]      [place], [place] [address]      [phone]  

## 2018-09-10 NOTE — ED Notes (Signed)
Pt in room calm and cooperative, and is speaking with TTS at this time.

## 2018-09-12 ENCOUNTER — Telehealth: Payer: Self-pay | Admitting: Family Medicine
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
## 2018-09-23 ENCOUNTER — Other Ambulatory Visit: Payer: Self-pay

## 2018-09-23 DIAGNOSIS — H1010 Acute atopic conjunctivitis, unspecified eye: Secondary | ICD-10-CM

## 2018-09-23 MED ORDER — LORATADINE 10 MG PO TABS: 10.0000 mg | ORAL_TABLET | Freq: Every day | ORAL | 6 refills | Status: AC | PRN

## 2018-09-23 MED ORDER — OMEPRAZOLE 40 MG PO CPDR: 40.0000 mg | DELAYED_RELEASE_CAPSULE | Freq: Every day | ORAL | 3 refills | Status: AC | PRN

## 2018-09-24 ENCOUNTER — Encounter (HOSPITAL_COMMUNITY): Payer: Self-pay

## 2018-09-24 ENCOUNTER — Encounter (HOSPITAL_COMMUNITY): Payer: Self-pay | Admitting: *Deleted
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
## 2021-02-17 ENCOUNTER — Telehealth: Payer: Self-pay

## 2021-02-17 NOTE — Telephone Encounter (Signed)
Left vm to confirm 02/21/21 appointment-Toni °

## 2021-02-21 ENCOUNTER — Ambulatory Visit: Payer: Medicaid Other | Admitting: Internal Medicine

## 2021-03-22 ENCOUNTER — Telehealth: Payer: Self-pay

## 2021-03-22 NOTE — Telephone Encounter (Signed)
Called both numbers no answer and no voicemail ?

## 2021-03-23 ENCOUNTER — Telehealth: Payer: Self-pay

## 2021-03-23 NOTE — Telephone Encounter (Signed)
Called no answer and no voicemail letter sent called both numbers ?

## 2021-03-28 ENCOUNTER — Telehealth: Payer: Self-pay

## 2021-03-28 NOTE — Telephone Encounter (Signed)
Called both numbers one is out of service and other one does not have voicemessage ?

## 2021-06-22 ENCOUNTER — Other Ambulatory Visit: Payer: Self-pay

## 2021-06-29 ENCOUNTER — Other Ambulatory Visit: Payer: Self-pay

## 2021-06-29 ENCOUNTER — Encounter: Payer: Self-pay | Admitting: Gastroenterology

## 2021-06-29 ENCOUNTER — Ambulatory Visit (INDEPENDENT_AMBULATORY_CARE_PROVIDER_SITE_OTHER): Payer: Medicaid Other | Admitting: Gastroenterology

## 2021-06-29 VITALS — BP 137/93 | HR 76 | Temp 98.2°F | Ht 62.0 in | Wt 301.2 lb

## 2021-06-29 DIAGNOSIS — R1013 Epigastric pain: Secondary | ICD-10-CM

## 2021-06-29 DIAGNOSIS — Z1211 Encounter for screening for malignant neoplasm of colon: Secondary | ICD-10-CM

## 2021-06-29 NOTE — Progress Notes (Signed)
Open in error

## 2021-06-29 NOTE — Patient Instructions (Signed)
Return to office for H-Pylori Breath Test on Monday 06/19 between the hours of 1-4:30pm.  Sign release of Medical Records to obtain records from [facility]  Follow up in 4 months.   Thank you for your visit today!

## 2021-06-29 NOTE — Progress Notes (Signed)
Arlyss Repress, MD [address]  [address]  [place], [place] [address]  Main: [phone]  Fax: [phone]    Gastroenterology Consultation  Referring Provider:     Leanna Sato, MD Primary Care Physician:  Mike Gip, FNP (Inactive) Primary Gastroenterologist:  Dr. Arlyss Repress Reason for Consultation: Epigastric and right upper quadrant pain        HPI:   Barbara Benton is a 26 y.o. female referred by Dr. Mike Gip, FNP (Inactive)  for consultation & management of epigastric and right upper quadrant pain.  Patient reports that since age of 26, she has been experiencing epigastric and right upper quadrant pain.  Patient reports that whenever she has a " big meal" she reports worsening of her symptoms followed by vomiting.  She reports that her primary care doctor did testing and she was told that she has fatty liver.  Patient drinks fruit juices, consumes fatty food on a regular basis.  She has history of anxiety, bipolar, depression, ADHD.  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  I do not have any recent laboratory data or imaging studies from her PCPs office Patient takes Prilosec 40 mg daily Patient denies smoking or alcohol use, denies marijuana use  Patient was evaluated by GI, Dr. Christella Hartigan in 2020, labs were unrevealing.  CT scan of the abdomen and pelvis was recommended.  She did not undergo NSAIDs: None  Antiplts/Anticoagulants/Anti thrombotics: None  GI Procedures: None  Past Medical History:  Diagnosis Date   ADHD (attention deficit hyperactivity disorder)    Anxiety    Bipolar disorder (HCC)    Depression    GERD (gastroesophageal reflux disease)    Hypertension    ODD (oppositional defiant disorder)     Past Surgical History:  Procedure Laterality Date   NO PAST SURGERIES      Current Outpatient Medications:    atomoxetine (STRATTERA) 18 MG capsule, Take 18 mg by mouth daily., Disp: , Rfl:    ibuprofen (ADVIL) 800 MG  tablet, TAKE 1 TABLET(800 MG) BY MOUTH EVERY 8 HOURS WITH FOOD AS NEEDED FOR MODERATE PAIN (Patient taking differently: Take 800 mg by mouth every 8 (eight) hours as needed for moderate pain.), Disp: 60 tablet, Rfl: 0   lithium carbonate (ESKALITH) 450 MG CR tablet, Take 450 mg by mouth daily., Disp: , Rfl:    loratadine (CLARITIN) 10 MG tablet, Take 1 tablet (10 mg total) by mouth daily as needed for allergies., Disp: 30 tablet, Rfl: 6   omeprazole (PRILOSEC) 40 MG capsule, Take 1 capsule (40 mg total) by mouth daily as needed (heartburn)., Disp: 30 capsule, Rfl: 3   ondansetron (ZOFRAN) 4 MG tablet, Take 4 mg by mouth 2 (two) times daily., Disp: , Rfl:    sertraline (ZOLOFT) 50 MG tablet, Take 50 mg by mouth daily., Disp: , Rfl:    ziprasidone (GEODON) 20 MG capsule, Take 20 mg by mouth at bedtime., Disp: , Rfl:    OZEMPIC, 0.25 OR 0.5 MG/DOSE, 2 MG/3ML SOPN, Inject 0.5 mg into the skin once a week. (Patient not taking: Reported on 06/29/2021), Disp: , Rfl:   Family History  Problem Relation Age of Onset   Diabetes Maternal Grandmother    Cancer Paternal Grandfather        type unknown     Social History   Tobacco Use   Smoking status: Every Day    Packs/day: 0.50    Types: Cigarettes  Smokeless tobacco: Never  Vaping Use   Vaping Use: Some days  Substance Use Topics   Alcohol use: Yes    Comment: occ   Drug use: Not Currently    Types: Marijuana, MDMA (Ecstacy)    Allergies as of 06/29/2021   (No Known Allergies)    Review of Systems:    All systems reviewed and negative except where noted in HPI.   Physical Exam:  BP (!) 137/93 (BP Location: Right Arm, Patient Position: Sitting, Cuff Size: Large)   Pulse 76   Temp 98.2 F (36.8 C) (Oral)   Ht 5\' 2"  (1.575 m)   Wt (!) 301 lb 3.2 oz (136.6 kg)   BMI 55.09 kg/m  No LMP recorded. (Menstrual status: Irregular Periods).  General:   Alert,  Well-developed, well-nourished, pleasant and cooperative in NAD Head:   Normocephalic and atraumatic. Eyes:  Sclera clear, no icterus.   Conjunctiva pink. Ears:  Normal auditory acuity. Nose:  No deformity, discharge, or lesions. Mouth:  No deformity or lesions,oropharynx pink & moist. Neck:  Supple; no masses or thyromegaly. Lungs:  Respirations even and unlabored.  Clear throughout to auscultation.   No wheezes, crackles, or rhonchi. No acute distress. Heart:  Regular rate and rhythm; no murmurs, clicks, rubs, or gallops. Abdomen:  Normal bowel sounds. Soft, non-tender and non-distended without masses, hepatosplenomegaly or hernias noted.  No guarding or rebound tenderness.   Rectal: Not performed Msk:  Symmetrical without gross deformities. Good, equal movement & strength bilaterally. Pulses:  Normal pulses noted. Extremities:  No clubbing or edema.  No cyanosis. Neurologic:  Alert and oriented x3;  grossly normal neurologically. Skin:  Intact without significant lesions or rashes. No jaundice. Psych:  Alert and cooperative. Normal mood and affect.  Imaging Studies: None  Assessment and Plan:   Barbara Benton is a 26 y.o. African-American female with morbid obesity, BMI 55 is seen in consultation for chronic symptoms of epigastric and right upper quadrant pain associated with intermittent episodes of nausea and vomiting triggered after a day large meal Patient is currently on Prilosec 40 mg daily, continue the same Recommend H. pylori breath test Obtain copy of most recent laboratory results as well as imaging studies from her PCPs office Discussed with patient regarding healthy lifestyle, avoid fatty foods, greasy foods, regular intake of carbonated beverages, fruit juices etc.  Follow up in 4 months   22, MD

## 2021-08-18 ENCOUNTER — Other Ambulatory Visit: Payer: Self-pay | Admitting: Family Medicine

## 2021-08-18 DIAGNOSIS — Z803 Family history of malignant neoplasm of breast: Secondary | ICD-10-CM

## 2021-08-18 DIAGNOSIS — Z1231 Encounter for screening mammogram for malignant neoplasm of breast: Secondary | ICD-10-CM

## 2021-09-10 ENCOUNTER — Other Ambulatory Visit: Payer: Self-pay

## 2021-09-10 ENCOUNTER — Emergency Department
Admission: EM | Admit: 2021-09-10 | Discharge: 2021-09-11 | Disposition: A | Discharge status: Home / Self Care | Payer: No Typology Code available for payment source | Attending: Emergency Medicine | Admitting: Emergency Medicine

## 2021-09-10 DIAGNOSIS — F23 Brief psychotic disorder: Secondary | ICD-10-CM | POA: Diagnosis present

## 2021-09-10 DIAGNOSIS — Z7189 Other specified counseling: Secondary | ICD-10-CM | POA: Insufficient documentation

## 2021-09-10 DIAGNOSIS — F17210 Nicotine dependence, cigarettes, uncomplicated: Secondary | ICD-10-CM | POA: Insufficient documentation

## 2021-09-10 DIAGNOSIS — I10 Essential (primary) hypertension: Secondary | ICD-10-CM | POA: Diagnosis not present

## 2021-09-10 DIAGNOSIS — F4322 Adjustment disorder with anxiety: Secondary | ICD-10-CM | POA: Insufficient documentation

## 2021-09-10 DIAGNOSIS — Z79899 Other long term (current) drug therapy: Secondary | ICD-10-CM

## 2021-09-10 NOTE — BH Assessment (Incomplete)
Comprehensive Clinical Assessment (CCA) Screening, Triage and Referral Note  09/10/2021 Barbara Benton [id number]  Chief Complaint:  Chief Complaint  Patient presents with  . pt states she needs her monthly haldol injection   Visit Diagnosis: ***  Patient Reported Information How did you hear about [place]? Self  What Is the Reason for Your Visit/Call Today? Pt states she needs her monthly haldol injection. Pt states she is three days late. Pt denies SI or HI, states does not need a  metnal health evaluation.  How Long Has This Been Causing You Problems? <Week  What Do You Feel Would Help You the Most Today? Housing Assistance   Have You Recently Had Any Thoughts About Hurting Yourself? No  Are You Planning to Commit Suicide/Harm Yourself At This time? No   Have you Recently Had Thoughts About Hurting Someone Barbara Benton? No  Are You Planning to Harm Someone at This Time? No  Explanation: No data recorded  Have You Used Any Alcohol or Drugs in the Past 24 Hours? No  How Long Ago Did You Use Drugs or Alcohol? No data recorded What Did You Use and How Much? No data recorded  Do You Currently Have a Therapist/Psychiatrist? Yes  Name of Therapist/Psychiatrist: [facility]   Have You Been Recently Discharged From Any Office Practice or Programs? No  Explanation of Discharge From Practice/Program: No data recorded   CCA Screening Triage Referral Assessment Type of Contact: Face-to-Face  Telemedicine Service Delivery:   Is this Initial or Reassessment? No data recorded Date Telepsych consult ordered in CHL:  No data recorded Time Telepsych consult ordered in CHL:  No data recorded Location of Assessment: [facility] ED  Provider Location: [facility] ED   Collateral Involvement: None provided   Does Patient Have a Court Appointed Legal Guardian? No data recorded Name and Contact of Legal Guardian: No data recorded If Minor and Not Living with Parent(s), Who has Custody? n/a  Is CPS  involved or ever been involved? -- (Not reported)  Is APS involved or ever been involved? -- (Not reported)   Patient Determined To Be At Risk for Harm To Self or Others Based on Review of Patient Reported Information or Presenting Complaint? No  Method: No data recorded Availability of Means: No data recorded Intent: No data recorded Notification Required: No data recorded Additional Information for Danger to Others Potential: No data recorded Additional Comments for Danger to Others Potential: No data recorded Are There Guns or Other Weapons in Your Home? No data recorded Types of Guns/Weapons: No data recorded Are These Weapons Safely Secured?                            No data recorded Who Could Verify You Are Able To Have These Secured: No data recorded Do You Have any Outstanding Charges, Pending Court Dates, Parole/Probation? No data recorded Contacted To Inform of Risk of Harm To Self or Others: Other: Comment   Does Patient Present under Involuntary Commitment? No  IVC Papers Initial File Date: No data recorded  [place] of Residence: [place]   Patient Currently Receiving the Following Services: Peer Support Services; ACTT Psychologist, educational)   Determination of Need: Urgent (48 hours)   Options For Referral: ED Visit   Discharge Disposition:     Barbara Benton R Barbara Benton, LCAS

## 2021-09-10 NOTE — BH Assessment (Signed)
Comprehensive Clinical Assessment (CCA) Screening, Triage and Referral Note  09/10/2021 Barbara Benton [id number] Recommendations for Services/Supports/Treatments: Consulted with Rashaun D, NP, who recommended pt for overnight observation and reassessment in the AM. Notified Dr. Derrill Kay and Alvis Lemmings, RN of disposition recommendation.   Barbara Benton is a 26 year old, English speaking, Black female with a PMH of Bipolar I disorder w/ depression, Adjustment d/o, PTSD, GAD, MDD with psychotic features.  Pt is vol. Per triage note: Pt states she needs her monthly Haldol injection. Pt states she is three days late. Pt denies SI or HI, states does not need a mental health evaluation. On assessment, Pt stated, "I got kicked out of my mom's apartment but I didn't take my medications in 3 days". Pt explained that she has been taking care of her mother who suffers from breast cancer, which caused her to forget to take her medications. Pt admitted to having mild symptoms of depression and anxiety, but reported that it is manageable. Pt denied substance use. Pt was forthcoming about having an irritable mood. Pt described her relationship with her mother as strained, explaining that she and her mother got into conflict over her missing her medications. Pt explained that she receives psych services and medications through [facility]. Pt explained that she just wanted to stay in the ED overnight to wait for her peer support worker to take her to look for housing. Pt reported that she has a felony that could possibly be expunged in the next month. 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 Pt identified her main stressors as conflict with her mother and having no housing. Pt had clear and coherent speech, speaking at a normal rate. Of note, pt. was goal directed. Pt had an anxious mood and a congruent affect. Pt had restless psychomotor activity. Pt did not have any delusional thoughts. Pt admitted  that she somethings has feeling of paranoia and believes people are talking about her. Pt denied current SI/HI/A/VH.  Chief Complaint:  Chief Complaint  Patient presents with   pt states she needs her monthly haldol injection   Visit Diagnosis: Adjustment disorder, with anxiety  Patient Reported Information How did you hear about [place]? Self  What Is the Reason for Your Visit/Call Today? Pt states she needs her monthly haldol injection. Pt states she is three days late. Pt denies SI or HI, states does not need a  metnal health evaluation.  How Long Has This Been Causing You Problems? <Week  What Do You Feel Would Help You the Most Today? Housing Assistance   Have You Recently Had Any Thoughts About Hurting Yourself? No  Are You Planning to Commit Suicide/Harm Yourself At This time? No   Have you Recently Had Thoughts About Hurting Someone Barbara Benton? No  Are You Planning to Harm Someone at This Time? No  Explanation: No data recorded  Have You Used Any Alcohol or Drugs in the Past 24 Hours? No  How Long Ago Did You Use Drugs or Alcohol? No data recorded What Did You Use and How Much? No data recorded  Do You Currently Have a Therapist/Psychiatrist? Yes  Name of Therapist/Psychiatrist: [facility]   Have You Been Recently Discharged From Any Office Practice or Programs? No  Explanation of Discharge From Practice/Program: No data recorded   CCA Screening Triage Referral Assessment Type of Contact: Face-to-Face  Telemedicine Service Delivery:   Is this Initial or Reassessment? No data recorded Date Telepsych consult ordered in CHL:  No  data recorded Time Telepsych consult ordered in CHL:  No data recorded Location of Assessment: [facility] ED  Provider Location: [facility] ED   Collateral Involvement: None provided   Does Patient Have a Court Appointed Legal Guardian? No data recorded Name and Contact of Legal Guardian: No data recorded If Minor and Not Living with Parent(s),  Who has Custody? n/a  Is CPS involved or ever been involved? -- (Not reported)  Is APS involved or ever been involved? -- (Not reported)   Patient Determined To Be At Risk for Harm To Self or Others Based on Review of Patient Reported Information or Presenting Complaint? No  Method: No data recorded Availability of Means: No data recorded Intent: No data recorded Notification Required: No data recorded Additional Information for Danger to Others Potential: No data recorded Additional Comments for Danger to Others Potential: No data recorded Are There Guns or Other Weapons in Your Home? No data recorded Types of Guns/Weapons: No data recorded Are These Weapons Safely Secured?                            No data recorded Who Could Verify You Are Able To Have These Secured: No data recorded Do You Have any Outstanding Charges, Pending Court Dates, Parole/Probation? No data recorded Contacted To Inform of Risk of Harm To Self or Others: Other: Comment   Does Patient Present under Involuntary Commitment? No  IVC Papers Initial File Date: No data recorded  [place] of Residence: [place]   Patient Currently Receiving the Following Services: Peer Support Services; ACTT Psychologist, educational)   Determination of Need: Urgent (48 hours)   Options For Referral: ED Visit   Discharge Disposition:     Barbara Benton R Barbara Benton, LCAS

## 2021-09-10 NOTE — Consult Note (Addendum)
Reason for Consult:  Psychiatric evaluation Referring Physician:  Dr. Derrill Kay Patient Identification: Barbara Benton MRN:  [id number] Principal Diagnosis: <principal problem not specified> Diagnosis:  Active Problems:   Schizophrenia, acute [facility])   Behavorial Health Consultation   Subjective:   "Wanting Haldol injection"  HPI: Psych Assessment   Barbara Benton, 26 y.o., female patient seen  by TTS; and chart reviewed on 09/10/21.  Per triage nurse, pt states she needs her monthly haldol injection. Pt states she is three days late. Pt denies SI or HI, states does not need a  metnal health evaluation. Pt is appropriate in triage and appears in no acute distress.    Patient denies suicidal/self-harm/homicidal ideation, psychosis, and paranoia.  Patient has remained calm throughout assessment and has answered questions appropriately.     For detailed note see TTS tele assessment note  Recommendations:  Overnight observations  Disposition:  Patient is psychiatrically cleared, but will be observed overnight to de-escalate argument with her mother.  For detailed note see TTS tele assessment note  Past Psychiatric History: Schizophrenia  Risk to Self:  no Risk to Others:  no Prior Inpatient Therapy:   Prior Outpatient Therapy:    Past Medical History:  Past Medical History:  Diagnosis Date   ADHD (attention deficit hyperactivity disorder)    Anxiety    Bipolar disorder (HCC)    Depression    GERD (gastroesophageal reflux disease)    Hypertension    ODD (oppositional defiant disorder)     Past Surgical History:  Procedure Laterality Date   NO PAST SURGERIES     Family History:  Family History  Problem Relation Age of Onset   Diabetes Maternal Grandmother    Cancer Paternal Grandfather        type unknown   Family Psychiatric  History:  Social History:  Social History   Substance and Sexual Activity  Alcohol Use Yes   Comment: occ     Social History   Substance  and Sexual Activity  Drug Use Not Currently   Types: Marijuana, MDMA Chiropodist)    Social History   Socioeconomic History   Marital status: Single    Spouse name: Not on file   Number of children: 0   Years of education: Not on file   Highest education level: Not on file  Occupational History   Occupation: Advertising account planner  Tobacco Use   Smoking status: Every Day    Packs/day: 0.50    Types: Cigarettes   Smokeless tobacco: Never  Vaping Use   Vaping Use: Some days  Substance and Sexual Activity   Alcohol use: Yes    Comment: occ   Drug use: Not Currently    Types: Marijuana, MDMA (Ecstacy)   Sexual activity: Yes    Birth control/protection: Condom  Other Topics Concern   Not on file  Social History Narrative   Client lives alone; stated that she is on disability but getting a job.  Stated that she does not have a Therapist, sports.   Social Determinants of Health   Financial Resource Strain: Not on file  Food Insecurity: Not on file  Transportation Needs: Not on file  Physical Activity: Not on file  Stress: Not on file  Social Connections: Not on file   Additional Social History:    Allergies:  No Known Allergies  Labs: No results found for this or any previous visit (from the past 48 hour(s)).  Medications:  No current facility-administered medications for this encounter.   Current  Outpatient Medications  Medication Sig Dispense Refill   ARAZLO 0.045 % LOTN Apply 1 Application topically in the morning.     Azelaic Acid 15 % gel Apply 1 Application topically every morning.     XENICAL 120 MG capsule Take 120 mg by mouth 3 (three) times daily.     atomoxetine (STRATTERA) 18 MG capsule Take 18 mg by mouth daily. (Patient not taking: Reported on 09/10/2021)     fluticasone (FLONASE) 50 MCG/ACT nasal spray Place 1 spray into both nostrils daily. (Patient not taking: Reported on 09/10/2021)     ibuprofen (ADVIL) 800 MG tablet TAKE 1 TABLET(800 MG) BY MOUTH EVERY 8 HOURS  WITH FOOD AS NEEDED FOR MODERATE PAIN (Patient not taking: Reported on 09/10/2021) 60 tablet 0   lithium carbonate (ESKALITH) 450 MG CR tablet Take 450 mg by mouth daily. (Patient not taking: Reported on 09/10/2021)     loratadine (CLARITIN) 10 MG tablet Take 1 tablet (10 mg total) by mouth daily as needed for allergies. (Patient not taking: Reported on 09/10/2021) 30 tablet 6   omeprazole (PRILOSEC) 40 MG capsule Take 1 capsule (40 mg total) by mouth daily as needed (heartburn). 30 capsule 3   ondansetron (ZOFRAN) 4 MG tablet Take 4 mg by mouth 2 (two) times daily. (Patient not taking: Reported on 09/10/2021)     sertraline (ZOLOFT) 50 MG tablet Take 50 mg by mouth daily. (Patient not taking: Reported on 09/10/2021)     ziprasidone (GEODON) 20 MG capsule Take 20 mg by mouth at bedtime. (Patient not taking: Reported on 09/10/2021)      Musculoskeletal: Strength & Muscle Tone: within normal limits Gait & Station: normal Patient leans: N/A  Psychiatric Specialty Exam:  Presentation  General Appearance: No data recorded Eye Contact:No data recorded Speech:No data recorded Speech Volume:No data recorded Handedness:No data recorded  Mood and Affect  Mood:No data recorded Affect:No data recorded  Thought Process  Thought Processes:No data recorded Descriptions of Associations:No data recorded Orientation:No data recorded Thought Content:No data recorded History of Schizophrenia/Schizoaffective disorder:No data recorded Duration of Psychotic Symptoms:No data recorded Hallucinations:No data recorded Ideas of Reference:No data recorded Suicidal Thoughts:No data recorded Homicidal Thoughts:No data recorded  Sensorium  Memory:No data recorded Judgment:No data recorded Insight:No data recorded  Executive Functions  Concentration:No data recorded Attention Span:No data recorded Recall:No data recorded Fund of Knowledge:No data recorded Language:No data recorded  Psychomotor Activity   Psychomotor Activity:No data recorded  Assets  Assets:No data recorded  Sleep  Sleep:No data recorded   Physical Exam: Physical Exam Vitals and nursing note reviewed.    Review of Systems  Psychiatric/Behavioral:  Negative for hallucinations and suicidal ideas.   All other systems reviewed and are negative.  Blood pressure (!) 147/101, pulse 94, temperature 99.1 F (37.3 C), temperature source Oral, resp. rate 16, height 5\' 2"  (1.575 m), weight 132.5 kg, last menstrual period 08/22/2021, SpO2 100 %. Body mass index is 53.41 kg/m.   Disposition: No evidence of imminent risk to self or others at present.   Patient does not meet criteria for psychiatric inpatient admission. Discussed crisis plan, support from social network, calling 911, coming to the Emergency Department, and calling Suicide Hotline. Overnight obs.  Patient to be set up with her act team to ensure administration of haldol injection.  10/22/2021, NP 09/10/2021 11:27 PM

## 2021-09-10 NOTE — ED Triage Notes (Signed)
Pt states she needs her monthly haldol injection. Pt states she is three days late. Pt denies SI or HI, states does not need a  metnal health evaluation. Pt is appropriate in triage and appears in no acute distress.

## 2021-09-10 NOTE — ED Notes (Addendum)
Patient reports that she usually goes to [facility] for haldol injections but she is 3 days late receiving it.  Patient denies si/hi or hallucinations.  Patient reports that she takes all other meds a prescribled.  Patient is here voluntarily at this time, patient not dressed out or belongings removed.

## 2021-09-10 NOTE — ED Provider Notes (Signed)
   [facility] Provider Note    Event Date/Time   First MD Initiated Contact with Patient 09/10/21 2153     (approximate)   History   pt states she needs her monthly haldol injection   HPI  Barbara Benton is a 26 y.o. female who presents to the emergency department today requesting her psychiatric medication.  Patient says that she was kicked out of her mother's house.  She says that she missed her Haldol injection a couple of days ago because she was accompanying her mother to one of her mother's chemotherapy sessions.  Patient denies any hallucinations.  Denies any SI or HI.      Physical Exam   Triage Vital Signs: ED Triage Vitals [09/10/21 2143]  Enc Vitals Group     BP (!) 147/101     Pulse Rate 94     Resp 16     Temp 99.1 F (37.3 C)     Temp Source Oral     SpO2 100 %     Weight 292 lb (132.5 kg)     Height 5\' 2"  (1.575 m)     Head Circumference      Peak Flow      Pain Score 0     Pain Loc      Pain Edu?      Excl. in GC?     Most recent vital signs: Vitals:   09/10/21 2143  BP: (!) 147/101  Pulse: 94  Resp: 16  Temp: 99.1 F (37.3 C)  SpO2: 100%    General: Awake, alert, oriented. CV:  Good peripheral perfusion.  Resp:  Normal effort.  Abd:  No distention.  Other:  Does not appear to be responding to any internal stimuli.    ED Results / Procedures / Treatments   Labs (all labs ordered are listed, but only abnormal results are displayed) Labs Reviewed - No data to display   EKG  None   RADIOLOGY None   PROCEDURES:  Critical Care performed: No  Procedures   MEDICATIONS ORDERED IN ED: Medications - No data to display   IMPRESSION / MDM / ASSESSMENT AND PLAN / ED COURSE  I reviewed the triage vital signs and the nursing notes.                              Differential diagnosis includes, but is not limited to, medication non compliance, housing difficulty.  Patient's presentation is most  consistent with acute presentation with potential threat to life or bodily function.  Patient presents to the emergency department today because of concerns for requiring her psychiatric medication.  On exam patient does not appear to be responding to any internal stimuli.  Denies SI or HI.  Pharmacy was able to investigate her medication they do not see that she has gotten a dose of her Haldol since January.  At this time I am somewhat hesitant to restart psychiatric medications.  Will have psychiatric team evaluate patient. FINAL CLINICAL IMPRESSION(S) / ED DIAGNOSES   Final diagnoses:  Medication management      Note:  This document was prepared using Dragon voice recognition software and may include unintentional dictation errors.    February, MD 09/10/21 [phone]

## 2021-09-11 DIAGNOSIS — Z79899 Other long term (current) drug therapy: Secondary | ICD-10-CM

## 2021-09-11 LAB — POC URINE PREG, ED

## 2021-09-11 MED ORDER — HALOPERIDOL DECANOATE 100 MG/ML IM SOLN
100.0000 mg | INTRAMUSCULAR | Status: DC
  Administered 2021-09-11: 100 mg via INTRAMUSCULAR
  Filled 2021-09-11: qty 1

## 2021-09-11 NOTE — ED Provider Notes (Addendum)
-----------------------------------------   11:56 AM on 09/11/2021 ----------------------------------------- Patient has been seen in the emergency department by psychiatry.  They believe the patient safe for discharge home from psychiatric standpoint.  Patient had no concerning medical findings, reassuring vitals.  Patient is currently sitting in her stretcher listening to her headphones, no distress.  We will discharge with outpatient resources provided by psychiatry.  Patient was able to receive her monthly Haldol injection in the emergency department per psychiatry.    Minna Antis, MD 09/11/21 1159

## 2021-09-11 NOTE — Consult Note (Signed)
[facility] Face-to-Face Psychiatry Consult   Reason for Consult:  need Haldol injection Referring Physician:  EDP Patient Identification: Barbara Benton MRN:  [id number] Principal Diagnosis: Medication management Diagnosis:  Principal Problem:   Medication management Active Problems:   Schizophrenia, acute (HCC)   Total Time spent with patient: 30 minutes  Subjective: "I do see my Haldol shot." Barbara Benton is a 26 y.o. female patient admitted with need for Haldol injection .  HPI: Patient presented to the ED last evening.  She reports to this provider that she has been very busy helping her mother out, who is undergoing chemotherapy for cancer.  She says she just "forgot" about getting her injection and her mother and her got into a little argument over this.    On evaluation this morning, patient is calm and cooperative.  She is not dressed out in behavioral health attire, as they have allowed her to stay in her street clothes.  Patient denies suicidal or homicidal ideations, auditory or visual hallucinations or paranoia.  Patient is speaking in clear, linear, logical sentences.  She states that she is a couple of days late for her Haldol IM injection.  This is verified by collateral from [facility], who reported her Haldol 100 mg IM injection was given 08/03/2021.  Patient will receive her injection here in the ED.  Patient states that her peer support person from [facility] will come and pick her up from the ED.  Past Psychiatric History: Schizophrenia, bipolar 1  Risk to Self:   Risk to Others:   Prior Inpatient Therapy:   Prior Outpatient Therapy:    Past Medical History:  Past Medical History:  Diagnosis Date   ADHD (attention deficit hyperactivity disorder)    Anxiety    Bipolar disorder (HCC)    Depression    GERD (gastroesophageal reflux disease)    Hypertension    ODD (oppositional defiant disorder)     Past Surgical History:  Procedure Laterality Date   NO  PAST SURGERIES     Family History:  Family History  Problem Relation Age of Onset   Diabetes Maternal Grandmother    Cancer Paternal Grandfather        type unknown   Family Psychiatric  History: Unknown Social History:  Social History   Substance and Sexual Activity  Alcohol Use Yes   Comment: occ     Social History   Substance and Sexual Activity  Drug Use Not Currently   Types: Marijuana, MDMA Chiropodist)    Social History   Socioeconomic History   Marital status: Single    Spouse name: Not on file   Number of children: 0   Years of education: Not on file   Highest education level: Not on file  Occupational History   Occupation: Advertising account planner  Tobacco Use   Smoking status: Every Day    Packs/day: 0.50    Types: Cigarettes   Smokeless tobacco: Never  Vaping Use   Vaping Use: Some days  Substance and Sexual Activity   Alcohol use: Yes    Comment: occ   Drug use: Not Currently    Types: Marijuana, MDMA (Ecstacy)   Sexual activity: Yes    Birth control/protection: Condom  Other Topics Concern   Not on file  Social History Narrative   Client lives alone; stated that she is on disability but getting a job.  Stated that she does not have a Therapist, sports.   Social Determinants of Health   Financial Resource Strain:  Not on file  Food Insecurity: Not on file  Transportation Needs: Not on file  Physical Activity: Not on file  Stress: Not on file  Social Connections: Not on file   Additional Social History:    Allergies:  No Known Allergies  Labs:  Results for orders placed or performed during the hospital encounter of 09/10/21 (from the past 48 hour(s))  POC Urine Pregnancy, ED     Status: None   Collection Time: 09/11/21 11:03 AM  Result Value Ref Range   Preg Test, Ur      Current Facility-Administered Medications  Medication Dose Route Frequency Provider Last Rate Last Admin   haloperidol decanoate (HALDOL DECANOATE) 100 MG/ML injection 100 mg   100 mg Intramuscular Q28 days Gabriel CirriBarthold, Lundynn Cohoon F, NP   100 mg at 09/11/21 1104   Current Outpatient Medications  Medication Sig Dispense Refill   ARAZLO 0.045 % LOTN Apply 1 Application topically in the morning.     Azelaic Acid 15 % gel Apply 1 Application topically every morning.     haloperidol decanoate (HALDOL DECANOATE) 50 MG/ML injection Inject 100 mg into the muscle every 28 (twenty-eight) days.     XENICAL 120 MG capsule Take 120 mg by mouth 3 (three) times daily.     atomoxetine (STRATTERA) 18 MG capsule Take 18 mg by mouth daily. (Patient not taking: Reported on 09/10/2021)     fluticasone (FLONASE) 50 MCG/ACT nasal spray Place 1 spray into both nostrils daily. (Patient not taking: Reported on 09/10/2021)     ibuprofen (ADVIL) 800 MG tablet TAKE 1 TABLET(800 MG) BY MOUTH EVERY 8 HOURS WITH FOOD AS NEEDED FOR MODERATE PAIN (Patient not taking: Reported on 09/10/2021) 60 tablet 0   lithium carbonate (ESKALITH) 450 MG CR tablet Take 450 mg by mouth daily. (Patient not taking: Reported on 09/10/2021)     loratadine (CLARITIN) 10 MG tablet Take 1 tablet (10 mg total) by mouth daily as needed for allergies. (Patient not taking: Reported on 09/10/2021) 30 tablet 6   omeprazole (PRILOSEC) 40 MG capsule Take 1 capsule (40 mg total) by mouth daily as needed (heartburn). 30 capsule 3   ondansetron (ZOFRAN) 4 MG tablet Take 4 mg by mouth 2 (two) times daily. (Patient not taking: Reported on 09/10/2021)     sertraline (ZOLOFT) 50 MG tablet Take 50 mg by mouth daily. (Patient not taking: Reported on 09/10/2021)     ziprasidone (GEODON) 20 MG capsule Take 20 mg by mouth at bedtime. (Patient not taking: Reported on 09/10/2021)      Musculoskeletal: Strength & Muscle Tone: within normal limits Gait & Station: normal Patient leans: N/A    Psychiatric Specialty Exam:  Presentation  General Appearance: Appropriate for Environment Eye Contact:Good Speech:Clear and Coherent Speech  Volume:Normal Handedness:No data recorded  Mood and Affect  Mood:Euthymic Affect:Congruent  Thought Process  Thought Processes:Coherent Descriptions of Associations:Intact  Orientation:Full (Time, Place and Person)  Thought Content:WDL; Logical  History of Schizophrenia/Schizoaffective disorder:Yes  Duration of Psychotic Symptoms:No data recorded Hallucinations:Hallucinations: None  Ideas of Reference:None  Suicidal Thoughts:Suicidal Thoughts: No  Homicidal Thoughts:Homicidal Thoughts: No   Sensorium  Memory:Immediate Good Judgment:Good Insight:Good  Executive Functions  Concentration:Good Attention Span:Good Recall:Good Fund of Knowledge:Good Language:Good  Psychomotor Activity  Psychomotor Activity:Psychomotor Activity: Normal  Assets  Assets:Desire for Improvement; Financial Resources/Insurance; Manufacturing systems engineerCommunication Skills; Resilience; Social Support; Physical Health  Sleep  Sleep:Sleep: Good  Physical Exam: Physical Exam Vitals and nursing note reviewed.  HENT:     Head: Normocephalic.  Nose: No congestion or rhinorrhea.  Cardiovascular:     Rate and Rhythm: Normal rate.  Pulmonary:     Effort: Pulmonary effort is normal.  Musculoskeletal:        General: Normal range of motion.     Cervical back: Normal range of motion.  Skin:    General: Skin is dry.  Neurological:     Mental Status: She is alert and oriented to person, place, and time.  Psychiatric:        Attention and Perception: Attention normal.        Mood and Affect: Mood normal.        Speech: Speech normal.        Behavior: Behavior normal.        Thought Content: Thought content is not paranoid or delusional. Thought content does not include homicidal or suicidal ideation.        Cognition and Memory: Cognition normal.        Judgment: Judgment normal.    Review of Systems  Constitutional: Negative.   Eyes: Negative.   Respiratory: Negative.    Cardiovascular: Negative.    Musculoskeletal: Negative.   Psychiatric/Behavioral:         Bipolar/ schizophrenia   Blood pressure (!) 147/88, pulse 79, temperature 98.5 F (36.9 C), resp. rate 18, height 5\' 2"  (1.575 m), weight 132.5 kg, last menstrual period 08/22/2021, SpO2 99 %. Body mass index is 53.41 kg/m.  Treatment Plan Summary: Plan Verified with outpatient provider, [facility], that last Haldol 100mg  injection was 08/03/2021. [facility] does not have a provider there until 8/31. We will give Haldol 100 mg IM here in ED and then discharge. Reviewed with EDP   Disposition: No evidence of imminent risk to self or others at present.   Patient does not meet criteria for psychiatric inpatient admission. Supportive therapy provided about ongoing stressors. Discussed crisis plan, support from social network, calling 911, coming to the Emergency Department, and calling Suicide Hotline.  08/05/2021, NP 09/11/2021 11:28 AM

## 2021-09-11 NOTE — ED Notes (Addendum)
Pt signed hard copy for discharge at this time.

## 2021-09-11 NOTE — ED Notes (Signed)
Explained to patient that we needed a urine sample at this time. Given at urine cup at this time.

## 2021-09-11 NOTE — ED Notes (Signed)
VOL/Consult Completed/ Pending D/C with follow up with her ACT Team

## 2021-09-11 NOTE — ED Notes (Signed)
Lunch meal tray given at this time.  

## 2021-09-11 NOTE — ED Provider Notes (Signed)
Emergency Medicine Observation Re-evaluation Note  Barbara Benton is a 26 y.o. female, seen on rounds today.  Pt initially presented to the ED for complaints of pt states she needs her monthly haldol injection Currently, the patient is resting, listening to music on her headphones, voicing no medical complaints.  Physical Exam  BP (!) 147/101   Pulse 94   Temp 99.1 F (37.3 C) (Oral)   Resp 16   Ht 5\' 2"  (1.575 m)   Wt 132.5 kg   LMP 08/22/2021   SpO2 100%   BMI 53.41 kg/m  Physical Exam General: Resting in no acute distress Cardiac: No cyanosis Lungs: Equal rise and fall Psych: Not agitated  ED Course / MDM  EKG:   I have reviewed the labs performed to date as well as medications administered while in observation.  Recent changes in the last 24 hours include no events overnight.  Plan  Current plan is for psychiatric reassessment this morning.  Barbara Benton is not under involuntary commitment.     [name], MD 09/11/21 [phone]

## 2021-09-11 NOTE — ED Notes (Signed)
Sallye Ober, NP at bedside at this time.

## 2021-10-30 ENCOUNTER — Ambulatory Visit (INDEPENDENT_AMBULATORY_CARE_PROVIDER_SITE_OTHER): Payer: Medicaid Other | Admitting: Gastroenterology

## 2021-10-30 ENCOUNTER — Encounter: Payer: Self-pay | Admitting: Gastroenterology

## 2021-10-30 VITALS — BP 139/99 | HR 93 | Temp 98.2°F | Ht 62.0 in | Wt 282.4 lb

## 2021-10-30 DIAGNOSIS — R1013 Epigastric pain: Secondary | ICD-10-CM | POA: Diagnosis not present

## 2021-10-30 NOTE — Progress Notes (Signed)
Cephas Darby, MD [address]  [place]  [place], [place] [address]  Main: [phone]  Fax: [phone]    Gastroenterology Consultation  Referring Provider:     No ref. provider found Primary Care Physician:  Marguerita Merles, MD Primary Gastroenterologist:  Dr. Cephas Darby Reason for Consultation: Epigastric and right upper quadrant pain        HPI:   Barbara Benton is a 26 y.o. female referred by Dr. Lennox Grumbles, Connye Burkitt, MD  for consultation & management of epigastric and right upper quadrant pain.  Patient reports that since age of 19, she has been experiencing epigastric and right upper quadrant pain.  Patient reports that whenever she has a " big meal" she reports worsening of her symptoms followed by vomiting.  She reports that her primary care doctor did testing and she was told that she has fatty liver.  Patient drinks fruit juices, consumes fatty food on a regular basis.  She has history of anxiety, bipolar, depression, ADHD.  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  I do not have any recent laboratory data or imaging studies from her PCPs office Patient takes Prilosec 40 mg daily Patient denies smoking or alcohol use, denies marijuana use  Patient was evaluated by GI, Dr. Ardis Hughs in 2020, labs were unrevealing.  CT scan of the abdomen and pelvis was recommended.  She did not undergo  Follow-up visit 10/30/2021 Patient is here for follow-up of symptoms of dyspepsia.  She tried to follow my recommendations from last visit, trying to eat healthy and cut back on sugary drinks but she could not resist and resumed. She lost about 20 pounds since last visit.  She did not undergo H. pylori breath test.  She reports intermittent constipation.  She did have heavy menstrual cycles which are now under control.   NSAIDs: None  Antiplts/Anticoagulants/Anti thrombotics: None  GI Procedures: None  Past Medical History:  Diagnosis Date   ADHD (attention deficit  hyperactivity disorder)    Anxiety    Bipolar disorder (HCC)    Depression    GERD (gastroesophageal reflux disease)    Hypertension    ODD (oppositional defiant disorder)     Past Surgical History:  Procedure Laterality Date   NO PAST SURGERIES      Current Outpatient Medications:    ARAZLO 0.045 % LOTN, Apply 1 Application topically in the morning., Disp: , Rfl:    atomoxetine (STRATTERA) 18 MG capsule, Take 18 mg by mouth daily., Disp: , Rfl:    Azelaic Acid 15 % gel, Apply 1 Application topically every morning., Disp: , Rfl:    fluticasone (FLONASE) 50 MCG/ACT nasal spray, Place 1 spray into both nostrils daily., Disp: , Rfl:    loratadine (CLARITIN) 10 MG tablet, Take 1 tablet (10 mg total) by mouth daily as needed for allergies., Disp: 30 tablet, Rfl: 6   omeprazole (PRILOSEC) 40 MG capsule, Take 1 capsule (40 mg total) by mouth daily as needed (heartburn)., Disp: 30 capsule, Rfl: 3   ondansetron (ZOFRAN) 4 MG tablet, Take 4 mg by mouth 2 (two) times daily., Disp: , Rfl:    sertraline (ZOLOFT) 50 MG tablet, Take 50 mg by mouth daily., Disp: , Rfl:    XENICAL 120 MG capsule, Take 120 mg by mouth 3 (three) times daily., Disp: , Rfl:    ziprasidone (GEODON) 20 MG capsule, Take 20 mg by mouth at bedtime., Disp: , Rfl:   Family  History  Problem Relation Age of Onset   Diabetes Maternal Grandmother    Cancer Paternal Grandfather        type unknown     Social History   Tobacco Use   Smoking status: Every Day    Packs/day: 0.50    Types: Cigarettes   Smokeless tobacco: Never  Vaping Use   Vaping Use: Some days  Substance Use Topics   Alcohol use: Yes    Comment: occ   Drug use: Not Currently    Types: Marijuana, MDMA (Ecstacy)    Allergies as of 10/30/2021   (No Known Allergies)    Review of Systems:    All systems reviewed and negative except where noted in HPI.   Physical Exam:  BP (!) 143/102 (BP Location: Right Arm, Patient Position: Sitting, Cuff Size:  Normal)   Pulse 96   Temp 98.2 F (36.8 C) (Oral)   Ht 5\' 2"  (1.575 m)   Wt 282 lb 6 oz (128.1 kg)   BMI 51.65 kg/m  No LMP recorded.  General:   Alert,  Well-developed, well-nourished, pleasant and cooperative in NAD Head:  Normocephalic and atraumatic. Eyes:  Sclera clear, no icterus.   Conjunctiva pink. Ears:  Normal auditory acuity. Nose:  No deformity, discharge, or lesions. Mouth:  No deformity or lesions,oropharynx pink & moist. Neck:  Supple; no masses or thyromegaly. Lungs:  Respirations even and unlabored.  Clear throughout to auscultation.   No wheezes, crackles, or rhonchi. No acute distress. Heart:  Regular rate and rhythm; no murmurs, clicks, rubs, or gallops. Abdomen:  Normal bowel sounds. Soft, non-tender and non-distended without masses, hepatosplenomegaly or hernias noted.  No guarding or rebound tenderness.   Rectal: Not performed Msk:  Symmetrical without gross deformities. Good, equal movement & strength bilaterally. Pulses:  Normal pulses noted. Extremities:  No clubbing or edema.  No cyanosis. Neurologic:  Alert and oriented x3;  grossly normal neurologically. Skin:  Intact without significant lesions or rashes. No jaundice. Psych:  Alert and cooperative. Normal mood and affect.  Imaging Studies: None  Assessment and Plan:   Barbara Benton is a 26 y.o. African-American female with morbid obesity, BMI 55 is seen in consultation for chronic symptoms of epigastric and right upper quadrant pain associated with intermittent episodes of nausea and vomiting triggered after a large meal.  Patient's symptoms have significantly improved from cutting back on high carb and processed foods Not on Prilosec any longer Recommend H. pylori breath test that were ordered during last visit Reiterated with patient regarding healthy lifestyle, avoid fatty foods, greasy foods, regular intake of carbonated beverages, any sweetened drinks  Follow up as needed   30,  MD

## 2021-11-03 LAB — H. PYLORI BREATH TEST: H pylori Breath Test: NEGATIVE

## 2021-11-06 ENCOUNTER — Telehealth: Payer: Self-pay

## 2021-11-06 DIAGNOSIS — G8929 Other chronic pain: Secondary | ICD-10-CM

## 2021-11-06 NOTE — Telephone Encounter (Signed)
Patient phone number is unavailable will send mychart message

## 2021-11-06 NOTE — Telephone Encounter (Signed)
Patient called back and verablized understanding of results. Patient wants to know what she can do for the abdominal pain

## 2021-11-06 NOTE — Telephone Encounter (Signed)
-----   Message from Lin Landsman, MD sent at 11/03/2021 10:57 AM EDT ----- Normal H Pylori breath test which means no infection in her stomach  RV

## 2021-11-07 NOTE — Telephone Encounter (Signed)
Patient is okay with getting this schedule. She states she can do any day during the week in the morning. Called scheduling and got patient schedule for 11/15/2021 at 10:00am please arrive at 9:45 nothing to eat or drink after midnight. Patient verbalized understanding of instructions and time and day

## 2021-11-07 NOTE — Telephone Encounter (Signed)
Recommend right upper quadrant ultrasound DX: Chronic right upper quadrant pain  RV

## 2021-11-07 NOTE — Addendum Note (Signed)
Addended by: Ulyess Blossom L on: 11/07/2021 03:35 PM   Modules accepted: Orders

## 2021-11-15 ENCOUNTER — Ambulatory Visit: Payer: Medicaid Other | Attending: Gastroenterology

## 2021-12-04 ENCOUNTER — Other Ambulatory Visit: Payer: Self-pay

## 2021-12-04 ENCOUNTER — Emergency Department
Admission: EM | Admit: 2021-12-04 | Discharge: 2021-12-04 | Disposition: A | Discharge status: Home / Self Care | Payer: Medicaid Other | Attending: Emergency Medicine | Admitting: Emergency Medicine

## 2021-12-04 ENCOUNTER — Encounter: Payer: Self-pay | Admitting: Emergency Medicine

## 2021-12-04 DIAGNOSIS — W134XXA Fall from, out of or through window, initial encounter: Secondary | ICD-10-CM | POA: Diagnosis not present

## 2021-12-04 DIAGNOSIS — S41112A Laceration without foreign body of left upper arm, initial encounter: Secondary | ICD-10-CM | POA: Insufficient documentation

## 2021-12-04 MED ORDER — LIDOCAINE HCL 1 % IJ SOLN
15.0000 mL | Freq: Once | INTRAMUSCULAR | Status: DC
Start: 2021-12-04 — End: 2021-12-04
  Filled 2021-12-04: qty 20

## 2021-12-04 MED ORDER — CEPHALEXIN 500 MG PO CAPS: 500.0000 mg | ORAL_CAPSULE | Freq: Four times a day (QID) | ORAL | 0 refills | Status: DC

## 2021-12-04 MED ORDER — LIDOCAINE HCL 1 % IJ SOLN
15.0000 mL | Freq: Once | INTRAMUSCULAR | Status: AC
Start: 2021-12-04 — End: 2021-12-04
  Administered 2021-12-04: 15 mL
  Filled 2021-12-04: qty 20

## 2021-12-04 MED ORDER — TETANUS-DIPHTH-ACELL PERTUSSIS 5-2.5-18.5 LF-MCG/0.5 IM SUSY
0.5000 mL | PREFILLED_SYRINGE | Freq: Once | INTRAMUSCULAR | Status: DC
  Filled 2021-12-04: qty 0.5

## 2021-12-04 NOTE — Discharge Instructions (Signed)
Remove sutures in ten days. Take Keflex four times daily for the next seven days.

## 2021-12-04 NOTE — ED Provider Triage Note (Signed)
Emergency Medicine Provider Triage Evaluation Note  Barbara Benton, a 26 y.o. female  was evaluated in triage.  Pt complains of laceration to the left upper arm.  Presents after her arm went through a glass window, resulting in an accidental laceration.  Review of Systems  Positive: LUE laceration Negative: FCS  Physical Exam  BP (!) 150/95 (BP Location: Right Arm)   Pulse 85   Temp 98.4 F (36.9 C) (Oral)   Resp 18   Ht 5\' 2"  (1.575 m)   Wt 124.7 kg   LMP 11/21/2021 (Exact Date)   SpO2 100%   BMI 50.30 kg/m  Gen:   Awake, no distress  NAD Resp:  Normal effort CTA MSK:   Moves extremities without difficulty  Other:    Medical Decision Making  Medically screening exam initiated at 4:24 PM.  Appropriate orders placed.  Barbara Benton was informed that the remainder of the evaluation will be completed by another provider, this initial triage assessment does not replace that evaluation, and the importance of remaining in the ED until their evaluation is complete.  Patient to the ED for evaluation of accidental laceration to left upper extremity.   [name], PA-C 12/04/21 1625

## 2021-12-04 NOTE — ED Triage Notes (Signed)
Pt states her arm went through glass windon and now has lac to upper arm. Dressing in plae with bleeding controlled.

## 2021-12-04 NOTE — ED Provider Notes (Signed)
[facility] Provider Note  Patient Contact: 6:03 PM (approximate)   History   Laceration   HPI  Barbara Benton is a 26 y.o. female presents to the emergency department with a large laceration to left upper arm after patient states that she fell through a glass window.  No numbness or tingling of the left upper extremity.  No similar injuries in the past.      Physical Exam   Triage Vital Signs: ED Triage Vitals [12/04/21 1615]  Enc Vitals Group     BP (!) 150/95     Pulse Rate 85     Resp 18     Temp 98.4 F (36.9 C)     Temp Source Oral     SpO2 100 %     Weight 275 lb (124.7 kg)     Height 5\' 2"  (1.575 m)     Head Circumference      Peak Flow      Pain Score 2     Pain Loc      Pain Edu?      Excl. in GC?     Most recent vital signs: Vitals:   12/04/21 1615  BP: (!) 150/95  Pulse: 85  Resp: 18  Temp: 98.4 F (36.9 C)  SpO2: 100%     General: Alert and in no acute distress. Eyes:  PERRL. EOMI. Head: No acute traumatic findings ENT:      Nose: No congestion/rhinnorhea.      Mouth/Throat: Mucous membranes are moist. Neck: No stridor. No cervical spine tenderness to palpation. Cardiovascular:  Good peripheral perfusion Respiratory: Normal respiratory effort without tachypnea or retractions. Lungs CTAB. Good air entry to the bases with no decreased or absent breath sounds. Gastrointestinal: Bowel sounds 4 quadrants. Soft and nontender to palpation. No guarding or rigidity. No palpable masses. No distention. No CVA tenderness. Musculoskeletal: Full range of motion to all extremities.  Neurologic:  No gross focal neurologic deficits are appreciated.  Skin: Patient has a 6 cm, triangular-shaped laceration of left upper arm deep to underlying adipose tissue.    ED Results / Procedures / Treatments   Labs (all labs ordered are listed, but only abnormal results are displayed) Labs Reviewed - No data to  display     PROCEDURES:  Critical Care performed: No  ..Laceration Repair  Date/Time: 12/04/2021 6:04 PM  Performed by: Orvil Feil, PA-C Authorized by: Orvil Feil, PA-C   Consent:    Consent obtained:  Verbal   Risks discussed:  Infection and pain Universal protocol:    Procedure explained and questions answered to patient or proxy's satisfaction: yes     Patient identity confirmed:  Verbally with patient Anesthesia:    Anesthesia method:  Local infiltration   Local anesthetic:  Lidocaine 1% w/o epi Laceration details:    Location:  Shoulder/arm   Shoulder/arm location:  L upper arm   Length (cm):  9   Depth (mm):  5 Pre-procedure details:    Preparation:  Patient was prepped and draped in usual sterile fashion Exploration:    Limited defect created (wound extended): no     Imaging outcome: foreign body not noted     Contaminated: no   Treatment:    Area cleansed with:  Povidone-iodine   Amount of cleaning:  Standard   Irrigation solution:  Sterile saline   Visualized foreign bodies/material removed: no     Debridement:  None   Undermining:  None  Scar revision: no   Skin repair:    Repair method:  Sutures   Suture size:  4-0   Suture technique:  Running locked   Number of sutures:  30 Approximation:    Approximation:  Close Repair type:    Repair type:  Complex Post-procedure details:    Dressing:  Non-adherent dressing    MEDICATIONS ORDERED IN ED: Medications  lidocaine (XYLOCAINE) 1 % (with pres) injection 15 mL (has no administration in time range)  Tdap (BOOSTRIX) injection 0.5 mL (has no administration in time range)  lidocaine (XYLOCAINE) 1 % (with pres) injection 15 mL (15 mLs Infiltration Given 12/04/21 1715)     IMPRESSION / MDM / ASSESSMENT AND PLAN / ED COURSE  I reviewed the triage vital signs and the nursing notes.                              Assessment and plan: Laceration:  26 year old female presents to the  emergency department with large left upper extremity laceration that was repaired in the emergency department without complication.  Recommended suture removal in 10 days.  Patient was discharged with Keflex.  Tetanus status was updated in the emergency department.     FINAL CLINICAL IMPRESSION(S) / ED DIAGNOSES   Final diagnoses:  Laceration of left upper extremity, initial encounter     Rx / DC Orders   ED Discharge Orders          Ordered    cephALEXin (KEFLEX) 500 MG capsule  4 times daily        12/04/21 1757             Note:  This document was prepared using Dragon voice recognition software and may include unintentional dictation errors.   Pia Mau [place], PA-C 12/04/21 1806    Jene Every, MD 12/04/21 [phone]

## 2021-12-09 ENCOUNTER — Other Ambulatory Visit: Payer: Self-pay

## 2021-12-09 ENCOUNTER — Emergency Department
Admission: EM | Admit: 2021-12-09 | Discharge: 2021-12-09 | Disposition: A | Discharge status: Home / Self Care | Payer: Medicaid Other | Attending: Emergency Medicine | Admitting: Emergency Medicine

## 2021-12-09 ENCOUNTER — Encounter: Payer: Self-pay | Admitting: Emergency Medicine

## 2021-12-09 DIAGNOSIS — Z5189 Encounter for other specified aftercare: Secondary | ICD-10-CM

## 2021-12-09 DIAGNOSIS — Z4800 Encounter for change or removal of nonsurgical wound dressing: Secondary | ICD-10-CM | POA: Insufficient documentation

## 2021-12-09 MED ORDER — CEPHALEXIN 500 MG PO CAPS: 500.0000 mg | ORAL_CAPSULE | Freq: Four times a day (QID) | ORAL | 0 refills | Status: AC

## 2021-12-09 MED ORDER — CEPHALEXIN 500 MG PO CAPS: 500.0000 mg | ORAL_CAPSULE | Freq: Four times a day (QID) | ORAL | 0 refills | Status: DC

## 2021-12-09 NOTE — Discharge Instructions (Signed)
Keep the wound clean, dry, and covered.  See your primary provider or local urgent care for suture removal in 1 week.

## 2021-12-09 NOTE — ED Triage Notes (Signed)
Patient reports having stitches placed to left upper arm on 20th. Patient unsure when stitches need to be removed.

## 2021-12-09 NOTE — ED Provider Notes (Signed)
[facility] Emergency Department Provider Note     Event Date/Time   First MD Initiated Contact with Patient 12/09/21 1823     (approximate)   History   Suture / Staple Removal   HPI  Barbara Benton is a 26 y.o. female presents to the ED for wound check and possible suture removal.  Patient had sutured repair to her left upper arm about 5 days prior.  She denies any fever, chills, sweats.  She notes 1 small area with some dried blood, but denies any wound dehiscence, purulent drainage, or pain.  She was apparently intended to have an antibiotic course, but chart review reveals that the printed prescription was never provided to the patient.     Physical Exam   Triage Vital Signs: ED Triage Vitals  Enc Vitals Group     BP 12/09/21 1817 (!) 162/120     Pulse Rate 12/09/21 1817 100     Resp 12/09/21 1817 18     Temp 12/09/21 1817 98.4 F (36.9 C)     Temp Source 12/09/21 1817 Oral     SpO2 12/09/21 1817 99 %     Weight 12/09/21 1818 275 lb (124.7 kg)     Height 12/09/21 1818 5\' 2"  (1.575 m)     Head Circumference --      Peak Flow --      Pain Score 12/09/21 1817 5     Pain Loc --      Pain Edu? --      Excl. in [place]? --     Most recent vital signs: Vitals:   12/09/21 1817  BP: (!) 162/120  Pulse: 100  Resp: 18  Temp: 98.4 F (36.9 C)  SpO2: 99%    General Awake, no distress.  CV:  Good peripheral perfusion.  RESP:  Normal effort.  ABD:  No distention.  SKIN:  Left upper extremity with well-healing wound with nylon sutures in place.  No signs of any surrounding erythema, warmth, or induration.  No wound dehiscence, purulent drainage is noted.   ED Results / Procedures / Treatments   Labs (all labs ordered are listed, but only abnormal results are displayed) Labs Reviewed - No data to display   EKG   RADIOLOGY   No results found.   PROCEDURES:  Critical Care performed: No  Procedures   MEDICATIONS ORDERED IN  ED: Medications - No data to display   IMPRESSION / MDM / [place] / ED COURSE  I reviewed the triage vital signs and the nursing notes.                              Differential diagnosis includes, but is not limited to, wound dehiscence, wound infection, interim wound check, cellulitis  Patient's presentation is most consistent with acute, uncomplicated illness.   Patient to the ED for interim wound check.  She presents with concern for possible need for suture removal.  Patient is 5 days status post wound repair.  No fevers, wound dehiscence, or purulent drainage is appreciated.  Patient's diagnosis is consistent with sutured wound without signs of infection or dehiscence. Patient will be discharged home with prescriptions for Keflex. Patient is to follow up with local urgent care for suture removal in 7 days as needed or otherwise directed. Patient is given ED precautions to return to the ED for any worsening or new symptoms.  FINAL CLINICAL IMPRESSION(S) / ED DIAGNOSES   Final diagnoses:  Visit for wound check     Rx / DC Orders   ED Discharge Orders          Ordered    cephALEXin (KEFLEX) 500 MG capsule  4 times daily,   Status:  Discontinued        12/09/21 1833    cephALEXin (KEFLEX) 500 MG capsule  4 times daily        12/09/21 1833             Note:  This document was prepared using Dragon voice recognition software and may include unintentional dictation errors.    Lissa Hoard, PA-C 12/10/21 2258    Sharman Cheek, MD 12/13/21 [phone]

## 2021-12-28 ENCOUNTER — Other Ambulatory Visit: Payer: Self-pay

## 2021-12-28 DIAGNOSIS — F29 Unspecified psychosis not due to a substance or known physiological condition: Secondary | ICD-10-CM | POA: Diagnosis not present

## 2021-12-28 DIAGNOSIS — R45851 Suicidal ideations: Secondary | ICD-10-CM | POA: Insufficient documentation

## 2021-12-28 DIAGNOSIS — I10 Essential (primary) hypertension: Secondary | ICD-10-CM | POA: Insufficient documentation

## 2021-12-28 DIAGNOSIS — F411 Generalized anxiety disorder: Secondary | ICD-10-CM | POA: Diagnosis not present

## 2021-12-28 DIAGNOSIS — F4325 Adjustment disorder with mixed disturbance of emotions and conduct: Secondary | ICD-10-CM | POA: Insufficient documentation

## 2021-12-28 DIAGNOSIS — F603 Borderline personality disorder: Secondary | ICD-10-CM | POA: Diagnosis not present

## 2021-12-28 DIAGNOSIS — F314 Bipolar disorder, current episode depressed, severe, without psychotic features: Secondary | ICD-10-CM | POA: Insufficient documentation

## 2021-12-28 DIAGNOSIS — F3162 Bipolar disorder, current episode mixed, moderate: Secondary | ICD-10-CM | POA: Diagnosis not present

## 2021-12-28 DIAGNOSIS — Z79899 Other long term (current) drug therapy: Secondary | ICD-10-CM | POA: Diagnosis not present

## 2021-12-28 DIAGNOSIS — Z1152 Encounter for screening for COVID-19: Secondary | ICD-10-CM | POA: Diagnosis not present

## 2021-12-28 DIAGNOSIS — F209 Schizophrenia, unspecified: Secondary | ICD-10-CM | POA: Insufficient documentation

## 2021-12-28 DIAGNOSIS — F17210 Nicotine dependence, cigarettes, uncomplicated: Secondary | ICD-10-CM | POA: Diagnosis not present

## 2021-12-28 DIAGNOSIS — F4322 Adjustment disorder with anxiety: Secondary | ICD-10-CM | POA: Diagnosis not present

## 2021-12-28 NOTE — ED Notes (Addendum)
Pt dressed out into burgundy scrubs by this rn and tech tamara, pt belongings placed in belongings bag  Green jacket Dark gray shirt 2 Education officer, community of black shoes Green bra Pair of Gray/pink socks Black bodysuit

## 2021-12-28 NOTE — ED Triage Notes (Addendum)
Pt comes voluntary from home via PD for psych eval. Pt states nothing is going on but wants to see a psychiatrist, does not offer any input on why she wants to be seen. Pt also states she wants to get an MRI, when asked why pt states "for my mind". Denies SI, HI, auditory and visual hallucinations.

## 2021-12-29 ENCOUNTER — Emergency Department
Admission: EM | Admit: 2021-12-29 | Discharge: 2021-12-29 | Disposition: A | Discharge status: Other Acute Inpatient Hospital | Payer: No Typology Code available for payment source | Attending: Emergency Medicine | Admitting: Emergency Medicine

## 2021-12-29 DIAGNOSIS — F4325 Adjustment disorder with mixed disturbance of emotions and conduct: Secondary | ICD-10-CM | POA: Diagnosis present

## 2021-12-29 DIAGNOSIS — F23 Brief psychotic disorder: Secondary | ICD-10-CM | POA: Diagnosis present

## 2021-12-29 DIAGNOSIS — R45851 Suicidal ideations: Secondary | ICD-10-CM

## 2021-12-29 DIAGNOSIS — F411 Generalized anxiety disorder: Secondary | ICD-10-CM | POA: Diagnosis present

## 2021-12-29 DIAGNOSIS — F3162 Bipolar disorder, current episode mixed, moderate: Secondary | ICD-10-CM

## 2021-12-29 DIAGNOSIS — Z79899 Other long term (current) drug therapy: Secondary | ICD-10-CM

## 2021-12-29 DIAGNOSIS — F314 Bipolar disorder, current episode depressed, severe, without psychotic features: Secondary | ICD-10-CM | POA: Diagnosis present

## 2021-12-29 DIAGNOSIS — F603 Borderline personality disorder: Secondary | ICD-10-CM | POA: Diagnosis present

## 2021-12-29 DIAGNOSIS — F333 Major depressive disorder, recurrent, severe with psychotic symptoms: Secondary | ICD-10-CM | POA: Diagnosis present

## 2021-12-29 DIAGNOSIS — F4322 Adjustment disorder with anxiety: Secondary | ICD-10-CM | POA: Diagnosis present

## 2021-12-29 DIAGNOSIS — Z8659 Personal history of other mental and behavioral disorders: Secondary | ICD-10-CM

## 2021-12-29 LAB — CBC
HCT: 44.5 % (ref 36.0–46.0)
Hemoglobin: 13.9 g/dL (ref 12.0–15.0)
MCH: 27.3 pg (ref 26.0–34.0)
MCHC: 31.2 g/dL (ref 30.0–36.0)
MCV: 87.3 fL (ref 80.0–100.0)
Platelets: 331 10*3/uL (ref 150–400)
RBC: 5.1 MIL/uL (ref 3.87–5.11)
RDW: 14.4 % (ref 11.5–15.5)
WBC: 6.4 10*3/uL (ref 4.0–10.5)
nRBC: 0 % (ref 0.0–0.2)

## 2021-12-29 LAB — ETHANOL: Alcohol, Ethyl (B): <10 mg/dL (ref <10)

## 2021-12-29 LAB — COMPREHENSIVE METABOLIC PANEL
ALT: 40 U/L (ref 0–44)
AST: 29 U/L (ref 15–41)
Albumin: 4.2 g/dL (ref 3.5–5.0)
Alkaline Phosphatase: 88 U/L (ref 38–126)
Anion gap: 7 (ref 5–15)
BUN: 13 mg/dL (ref 6–20)
CO2: 29 mmol/L (ref 22–32)
Calcium: 9.5 mg/dL (ref 8.9–10.3)
Chloride: 102 mmol/L (ref 98–111)
Creatinine, Ser: 0.79 mg/dL (ref 0.44–1.00)
GFR, Estimated: >60 mL/min (ref >60)
Glucose, Bld: 98 mg/dL (ref 70–99)
Potassium: 3.7 mmol/L (ref 3.5–5.1)
Sodium: 138 mmol/L (ref 135–145)
Total Bilirubin: 0.8 mg/dL (ref 0.3–1.2)
Total Protein: 8.2 g/dL — ABNORMAL HIGH (ref 6.5–8.1)

## 2021-12-29 LAB — ACETAMINOPHEN LEVEL: Acetaminophen (Tylenol), Serum: <10 ug/mL — ABNORMAL LOW (ref 10–30)

## 2021-12-29 LAB — SALICYLATE LEVEL: Salicylate Lvl: <7 mg/dL — ABNORMAL LOW (ref 7.0–30.0)

## 2021-12-29 LAB — SARS CORONAVIRUS 2 BY RT PCR: SARS Coronavirus 2 by RT PCR: NEGATIVE
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
## 2021-12-29 NOTE — ED Notes (Signed)
Pt sitting up in bed, pt has some sutures in L upper arm, pt doesn't remember when she had them placed, removed sutures per md instructions.  Pt tolerated well. Wound seem well healed over

## 2021-12-29 NOTE — ED Notes (Signed)
  Patient has been accepted to [facility].  Patient assigned to [facility] Accepting physician is Dr. Estill Cotta.  Call report to nurse to nurse pager [phone] [facility].  Representative was [employer].

## 2021-12-29 NOTE — ED Provider Notes (Signed)
[facility] Provider Note    Event Date/Time   First MD Initiated Contact with Patient 12/29/21 0007     (approximate)   History   No chief complaint on file.   HPI  Barbara Benton is a 26 y.o. female with history of obesity, hypertension, bipolar disorder, ODD, ADHD, depression and anxiety who presents to the emergency department with complaints of suicidal thoughts without plan.  When asked if she is having hallucinations patient has a long pause and appears to be responding to internal stimuli but states no.  She denies HI but states she went "crazy" on her partner.  She refuses to elaborate.  Denies drug or alcohol use.   History provided by patient.    Past Medical History:  Diagnosis Date   ADHD (attention deficit hyperactivity disorder)    Anxiety    Bipolar disorder (HCC)    Depression    GERD (gastroesophageal reflux disease)    Hypertension    ODD (oppositional defiant disorder)     Past Surgical History:  Procedure Laterality Date   NO PAST SURGERIES      MEDICATIONS:  Prior to Admission medications   Medication Sig Start Date End Date Taking? Authorizing Provider  ARAZLO 0.045 % LOTN Apply 1 Application topically in the morning. 08/01/21   [provider]  atomoxetine (STRATTERA) 18 MG capsule Take 18 mg by mouth daily. 01/19/21   [provider]  Azelaic Acid 15 % gel Apply 1 Application topically every morning. 08/01/21   [provider]  fluticasone (FLONASE) 50 MCG/ACT nasal spray Place 1 spray into both nostrils daily. 07/07/21   [provider]  loratadine (CLARITIN) 10 MG tablet Take 1 tablet (10 mg total) by mouth daily as needed for allergies. 09/23/18   Lanae Boast, FNP  omeprazole (PRILOSEC) 40 MG capsule Take 1 capsule (40 mg total) by mouth daily as needed (heartburn). 09/23/18   Lanae Boast, FNP  ondansetron (ZOFRAN) 4 MG tablet Take 4 mg by mouth 2 (two) times daily. 01/19/21   [provider]  sertraline (ZOLOFT) 50 MG tablet Take 50 mg by mouth daily. 01/19/21   [provider]  XENICAL 120 MG capsule Take 120 mg by mouth 3 (three) times daily. 07/28/21   [provider]  ziprasidone (GEODON) 20 MG capsule Take 20 mg by mouth at bedtime.    [provider]    Physical Exam   Triage Vital Signs: ED Triage Vitals  Enc Vitals Group     BP 12/28/21 2335 (!) 141/94     Pulse Rate 12/28/21 2335 86     Resp 12/28/21 2335 18     Temp 12/28/21 2335 97.7 F (36.5 C)     Temp Source 12/28/21 2335 Axillary     SpO2 12/28/21 2335 100 %     Weight 12/28/21 2335 250 lb (113.4 kg)     Height 12/28/21 2335 5\' 2"  (1.575 m)     Head Circumference --      Peak Flow --      Pain Score 12/28/21 2355 0     Pain Loc --      Pain Edu? --      Excl. in [place]? --     Most recent vital signs: Vitals:   12/28/21 2335  BP: (!) 141/94  Pulse: 86  Resp: 18  Temp: 97.7 F (36.5 C)  SpO2: 100%    CONSTITUTIONAL: Alert and oriented and responds appropriately to  questions. Well-appearing; well-nourished HEAD: Normocephalic, atraumatic EYES: Conjunctivae clear, pupils appear equal, sclera nonicteric ENT: normal nose; moist mucous membranes NECK: Supple, normal ROM CARD: RRR; S1 and S2 appreciated; no murmurs, no clicks, no rubs, no gallops RESP: Normal chest excursion without splinting or tachypnea; breath sounds clear and equal bilaterally; no wheezes, no rhonchi, no rales, no hypoxia or respiratory distress, speaking full sentences ABD/GI: Normal bowel sounds; non-distended; soft, non-tender, no rebound, no guarding, no peritoneal signs BACK: The back appears normal EXT: Normal ROM in all joints; no deformity noted, no edema; no cyanosis SKIN: Normal color for age and race; warm; no rash on exposed skin NEURO: Moves all extremities equally, normal speech PSYCH: Patient appears to be responding to internal stimuli.  Reports she is feeling suicidal  without plan.   ED Results / Procedures / Treatments   LABS: (all labs ordered are listed, but only abnormal results are displayed) Labs Reviewed  COMPREHENSIVE METABOLIC PANEL - Abnormal; Notable for the following components:      Result Value   Total Protein 8.2 (*)    All other components within normal limits  SALICYLATE LEVEL - Abnormal; Notable for the following components:   Salicylate Lvl <7.0 (*)    All other components within normal limits  ACETAMINOPHEN LEVEL - Abnormal; Notable for the following components:   Acetaminophen (Tylenol), Serum <10 (*)    All other components within normal limits  SARS CORONAVIRUS 2 BY RT PCR  ETHANOL  CBC  URINE DRUG SCREEN, QUALITATIVE ([facility] ONLY)  POC URINE PREG, ED     EKG:  RADIOLOGY: My personal review and interpretation of imaging:    I have personally reviewed all radiology reports.   No results found.   PROCEDURES:  Critical Care performed: No    Procedures    IMPRESSION / MDM / ASSESSMENT AND PLAN / ED COURSE  I reviewed the triage vital signs and the nursing notes.    Patient here for suicidal thoughts without plan.  Also appears to be acutely psychotic and responding to internal stimuli.  History of acute psychosis and previous psychiatric admissions.     DIFFERENTIAL DIAGNOSIS (includes but not limited to):   Depression, anxiety, bipolar disorder, psychosis, intoxication   Patient's presentation is most consistent with acute presentation with potential threat to life or bodily function.   PLAN: Will obtain screening labs and urine and placed under full IVC.  Will consult psychiatry and TTS.   MEDICATIONS GIVEN IN ED: Medications - No data to display   ED COURSE: Patient's labs showed no leukocytosis, normal hemoglobin and electrolytes.  Normal renal function and LFTs.  COVID-negative.  Tylenol, salicylate and alcohol levels negative.  Medically cleared at this time.   CONSULTS: TTS and  psychiatry recommend inpatient treatment and I agree.  They will work on placement.   OUTSIDE RECORDS REVIEWED: Reviewed patient's last psychiatric notes in 2020.       FINAL CLINICAL IMPRESSION(S) / ED DIAGNOSES   Final diagnoses:  Suicidal ideation  Acute psychosis (HCC)     Rx / DC Orders   ED Discharge Orders     None        Note:  This document was prepared using Dragon voice recognition software and may include unintentional dictation errors.   Sayed Apostol, Layla Maw, DO 12/29/21 [phone]
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
## 2021-12-29 NOTE — BH Assessment (Signed)
Referral information for Psychiatric Hospitalization faxed to:   [facility] [phone]- [address])  [address] [phone]),   [facility] [phone] -or- [phone]),   [facility] [phone])  [facility] [phone])  [facility]ab[facility] [phone])

## 2021-12-29 NOTE — BH Assessment (Signed)
Patient has been accepted to [facility].  Patient assigned to [facility] Accepting physician is Dr. Estill Cotta.  Call report to nurse to nurse pager [phone] [facility].  Representative was [employer].   ER Staff is aware of it:  Carlene, ER Secretary  Dr. Elesa Massed, ER MD  Thayer Ohm Patient's Nurse     Patient can arrive anytime after 8 AM 12/29/21.

## 2021-12-29 NOTE — ED Notes (Signed)
Report to officer, pt's belongings returned to pt and given to officer, pt states that this is all of her belongings, includes a purse and cell phone.  Pt states that she needs nothing more before transfer, pt ambulatory from dpt with officer.

## 2021-12-29 NOTE — ED Notes (Signed)
[facility] called for transport to [facility])/rep:danielle.  
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
## 2022-03-02 ENCOUNTER — Encounter: Payer: Self-pay | Admitting: Emergency Medicine

## 2022-03-02 ENCOUNTER — Emergency Department
Admission: EM | Admit: 2022-03-02 | Discharge: 2022-03-03 | Disposition: A | Discharge status: Other Acute Inpatient Hospital | Payer: No Typology Code available for payment source | Attending: Emergency Medicine | Admitting: Emergency Medicine

## 2022-03-02 DIAGNOSIS — F3132 Bipolar disorder, current episode depressed, moderate: Secondary | ICD-10-CM | POA: Diagnosis not present

## 2022-03-02 DIAGNOSIS — Z20822 Contact with and (suspected) exposure to covid-19: Secondary | ICD-10-CM | POA: Diagnosis not present

## 2022-03-02 DIAGNOSIS — F250 Schizoaffective disorder, bipolar type: Secondary | ICD-10-CM | POA: Insufficient documentation

## 2022-03-02 DIAGNOSIS — R456 Violent behavior: Secondary | ICD-10-CM | POA: Diagnosis present

## 2022-03-02 DIAGNOSIS — I10 Essential (primary) hypertension: Secondary | ICD-10-CM | POA: Diagnosis not present

## 2022-03-02 DIAGNOSIS — Z79899 Other long term (current) drug therapy: Secondary | ICD-10-CM | POA: Diagnosis not present

## 2022-03-02 LAB — ACETAMINOPHEN LEVEL: Acetaminophen (Tylenol), Serum: <10 ug/mL — ABNORMAL LOW (ref 10–30)

## 2022-03-02 LAB — PREGNANCY, URINE: Preg Test, Ur: NEGATIVE

## 2022-03-02 LAB — CBC
HCT: 46.9 % — ABNORMAL HIGH (ref 36.0–46.0)
Hemoglobin: 14.4 g/dL (ref 12.0–15.0)
MCH: 27.5 pg (ref 26.0–34.0)
MCHC: 30.7 g/dL (ref 30.0–36.0)
MCV: 89.5 fL (ref 80.0–100.0)
Platelets: 302 10*3/uL (ref 150–400)
RBC: 5.24 MIL/uL — ABNORMAL HIGH (ref 3.87–5.11)
RDW: 15.4 % (ref 11.5–15.5)
WBC: 11.3 10*3/uL — ABNORMAL HIGH (ref 4.0–10.5)
nRBC: 0 % (ref 0.0–0.2)

## 2022-03-02 LAB — COMPREHENSIVE METABOLIC PANEL
ALT: 53 U/L — ABNORMAL HIGH (ref 0–44)
AST: 40 U/L (ref 15–41)
Albumin: 4 g/dL (ref 3.5–5.0)
Alkaline Phosphatase: 98 U/L (ref 38–126)
Anion gap: 12 (ref 5–15)
BUN: 10 mg/dL (ref 6–20)
CO2: 21 mmol/L — ABNORMAL LOW (ref 22–32)
Calcium: 8.9 mg/dL (ref 8.9–10.3)
Chloride: 101 mmol/L (ref 98–111)
Creatinine, Ser: 0.76 mg/dL (ref 0.44–1.00)
GFR, Estimated: >60 mL/min (ref >60)
Glucose, Bld: 111 mg/dL — ABNORMAL HIGH (ref 70–99)
Potassium: 3.4 mmol/L — ABNORMAL LOW (ref 3.5–5.1)
Sodium: 134 mmol/L — ABNORMAL LOW (ref 135–145)
Total Bilirubin: 0.7 mg/dL (ref 0.3–1.2)
Total Protein: 7.9 g/dL (ref 6.5–8.1)

## 2022-03-02 LAB — URINE DRUG SCREEN, QUALITATIVE (ARMC ONLY)
Amphetamines, Ur Screen: NOT DETECTED
Barbiturates, Ur Screen: NOT DETECTED
Benzodiazepine, Ur Scrn: NOT DETECTED
Cannabinoid 50 Ng, Ur ~~LOC~~: NOT DETECTED
Cocaine Metabolite,Ur ~~LOC~~: NOT DETECTED
MDMA (Ecstasy)Ur Screen: NOT DETECTED
Methadone Scn, Ur: NOT DETECTED
Opiate, Ur Screen: NOT DETECTED
Phencyclidine (PCP) Ur S: NOT DETECTED
Tricyclic, Ur Screen: NOT DETECTED

## 2022-03-02 LAB — RESP PANEL BY RT-PCR (RSV, FLU A&B, COVID)  RVPGX2
Influenza A by PCR: NEGATIVE
Influenza B by PCR: NEGATIVE
Resp Syncytial Virus by PCR: NEGATIVE
SARS Coronavirus 2 by RT PCR: NEGATIVE

## 2022-03-02 LAB — ETHANOL: Alcohol, Ethyl (B): <10 mg/dL (ref <10)

## 2022-03-02 LAB — SALICYLATE LEVEL: Salicylate Lvl: <7 mg/dL — ABNORMAL LOW (ref 7.0–30.0)

## 2022-03-02 MED ORDER — ZIPRASIDONE MESYLATE 20 MG IM SOLR
10.0000 mg | Freq: Once | INTRAMUSCULAR | Status: DC
  Filled 2022-03-02: qty 20

## 2022-03-02 MED ORDER — TRAZODONE HCL 50 MG PO TABS: 50.0000 mg | ORAL_TABLET | Freq: Every evening | ORAL | Status: DC | PRN

## 2022-03-02 MED ORDER — ZIPRASIDONE MESYLATE 20 MG IM SOLR
10.0000 mg | Freq: Once | INTRAMUSCULAR | Status: AC
  Administered 2022-03-02: 10 mg via INTRAMUSCULAR

## 2022-03-02 MED ORDER — GABAPENTIN 100 MG PO CAPS
100.0000 mg | ORAL_CAPSULE | Freq: Three times a day (TID) | ORAL | Status: DC
  Administered 2022-03-02: 100 mg via ORAL
  Filled 2022-03-02: qty 1

## 2022-03-02 MED ORDER — GABAPENTIN 100 MG PO CAPS
200.0000 mg | ORAL_CAPSULE | Freq: Once | ORAL | Status: AC
  Administered 2022-03-02: 200 mg via ORAL
  Filled 2022-03-02: qty 2

## 2022-03-02 MED ORDER — MIDAZOLAM HCL 2 MG/2ML IJ SOLN: 2.0000 mg | Freq: Once | INTRAMUSCULAR | Status: DC

## 2022-03-02 MED ORDER — ZIPRASIDONE HCL 20 MG PO CAPS
20.0000 mg | ORAL_CAPSULE | Freq: Two times a day (BID) | ORAL | Status: DC
  Administered 2022-03-02 – 2022-03-03 (×2): 20 mg via ORAL
  Filled 2022-03-02 (×3): qty 1

## 2022-03-02 MED ORDER — SERTRALINE HCL 50 MG PO TABS
50.0000 mg | ORAL_TABLET | Freq: Every day | ORAL | Status: DC
  Administered 2022-03-02: 50 mg via ORAL
  Filled 2022-03-02: qty 1

## 2022-03-02 MED ORDER — GABAPENTIN 300 MG PO CAPS
300.0000 mg | ORAL_CAPSULE | Freq: Three times a day (TID) | ORAL | Status: DC
  Administered 2022-03-02: 300 mg via ORAL
  Filled 2022-03-02: qty 1

## 2022-03-02 MED ORDER — LORAZEPAM 1 MG PO TABS
1.0000 mg | ORAL_TABLET | Freq: Once | ORAL | Status: AC
  Administered 2022-03-02: 1 mg via ORAL
  Filled 2022-03-02 (×2): qty 1

## 2022-03-02 NOTE — ED Notes (Signed)
EDP S. Mumma at bedside with RN to discuss with patient plan of care. Pt continues to violently cry and endorses that she feels scared. EDP informed pt that she would order PO med to try to calm her down.

## 2022-03-02 NOTE — ED Notes (Signed)
IVC paperwork initiated by Chancy Hurter, EDP

## 2022-03-02 NOTE — ED Notes (Signed)
Pt given dinner tray.

## 2022-03-02 NOTE — ED Notes (Signed)
Patient is in room. Introduced myself to patient. Patient denies SI/HI, but states she feels sad. Patient also state to the MD that she has pain all over. Patient appears calm and cooperative.

## 2022-03-02 NOTE — ED Notes (Signed)
Pt appears to be sleeping in bed at this time, NAD noted. Rise and fall of chest noted.

## 2022-03-02 NOTE — ED Triage Notes (Signed)
[place] [employer] [facility] [place] [facility]. Per PD, they were called out because the patient was standing outside [facility] in a daze and she was asked to leave. Pt notes telling the officer she's "not feeling well" and wanted to get checked out. Pt believes to be having a "mental break down". Denies SI/HI, auditory or visual hallucinations.

## 2022-03-02 NOTE — ED Notes (Addendum)
Pt unable to follow instructions to take PO meds. EDP at bedside to encourage as well. Emotional distress increases. EDP informed pt that IM injection would be required to help aide pt to calm down and she is very emotionally distressed.  Pt will not take pill at this time.

## 2022-03-02 NOTE — ED Notes (Signed)
IVC pending consult   

## 2022-03-02 NOTE — ED Notes (Signed)
Pt taking shower at this time with supplies given by ED tech.

## 2022-03-02 NOTE — ED Notes (Signed)
IVC  CONSULT  DONE   PENDING  PLACEMENT

## 2022-03-02 NOTE — ED Provider Notes (Addendum)
Emergency Medicine Observation Re-evaluation Note  Barbara Benton is a 27 y.o. female, seen on rounds today.  Pt initially presented to the ED for complaints of Psychiatric Evaluation  Currently, the patient is is in acute distress, crying and tearful.  States she feels like she cannot breathe.  States that she has delusions and is feeling very anxious.  Feels like she is going crazy.  Does not want to feel like she is going crazy anymore.  Physical Exam  Blood pressure (!) 143/87, pulse 78, temperature 98 F (36.7 C), resp. rate 18, height 5' 2"$  (1.575 m), weight 111.3 kg, SpO2 98 %.  Physical Exam: General: Tearful and crying Pulm: Normal WOB, speaking in full sentences Neuro: Moving all extremities Psych: Tearful and crying.     ED Course / MDM     I have reviewed the labs performed to date as well as medications administered while in observation.  Recent changes in the last 24 hours include: No acute events overnight.  This morning patient is tearful and crying.  Patient was agreeable to taking p.o. Ativan this morning, however after regular he ended p.o. Ativan refusing to take.  Patient yelling and screaming.  Unable to verbally redirect.    Ordered Geodon when patient refusing p.o. Ativan.  Patient then became more calm and stopped yelling.  Agreeable to take p.o. Ativan.  Geodon not given.  Involuntary commitment paperwork obtained given concern for self-harm for the patient and acute psychosis with delusions.  10:24 AM  Patient again calling out and screaming, states that she wants help for her delusions.  Asking for a shot.  Patient given IM Geodon. Plan   Current plan: Patient awaiting psychiatric disposition. Patient is under full IVC at this time.        Nathaniel Man, MD 03/02/22 1025

## 2022-03-02 NOTE — ED Notes (Signed)
Pt calm and cooperative at this time, reestablished report w/ pt. Plan of care ongoing.

## 2022-03-02 NOTE — ED Notes (Signed)
Hospital meal provided.  100% consumed, pt tolerated w/o complaints.  Waste discarded appropriately.   

## 2022-03-02 NOTE — ED Notes (Signed)
Pt yelling and crying out in room, EDP Mumma notified and she re-assessed patient in room.
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
## 2022-03-02 NOTE — ED Notes (Signed)
Pt out of room requesting to speak with someone about her feelings. Appears emotionally disturbed. RN  to speak with patient. Pt just sits on bed and stares past RN, appears to be having visual hallucination. Pt begins to violently cry and states that she is scared. Unable to elaborate further. Pt ensured of safety and that staff are to be trusted. No decrease in emotional distress. EDP notified.

## 2022-03-02 NOTE — ED Notes (Addendum)
Pt dressed out by this RN and EDT Jonelle Sidle), belongings include:  Pink pants Orange shirt Black shoes Green jacket  Black socks Multi-colored bra  (1 ibuprofen within her bra - which were discarded)\ Green watch

## 2022-03-02 NOTE — ED Notes (Signed)
Pt is now awake, vital signs obtained and nighttime snack given at this time.

## 2022-03-02 NOTE — ED Notes (Signed)
Pt aware of need for urine sample.  

## 2022-03-02 NOTE — ED Notes (Signed)
Pt  placed  under  IVC papers  informed  Multimedia programmer

## 2022-03-02 NOTE — ED Notes (Signed)
Pt crying loudly in room, pt repetitively stating that she is cared. Pt restless and hyperventilating.

## 2022-03-02 NOTE — ED Notes (Signed)
Pt yelling and crying, states "I need help," pt will not elaborate on needs. Verbal reassurance given. Plan of care ongoing.

## 2022-03-02 NOTE — ED Notes (Signed)
Unable to obtain vital signs due to pt being asleep.
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
## 2022-03-02 NOTE — ED Notes (Signed)
Pt awake at this time, states she wants to take a shower and whimpering. Pt given reassurance and informed she may shower once she has calmed down a little.

## 2022-03-02 NOTE — ED Notes (Signed)
Psych team at bedside .

## 2022-03-02 NOTE — ED Provider Notes (Signed)
[facility] Provider Note    Event Date/Time   First MD Initiated Contact with Patient 03/02/22 [phone]     (approximate)   History   Psychiatric Evaluation   HPI  Barbara Benton is a 27 y.o. female brought to the ED by [employer]PD voluntarily for behavioral medicine evaluation. States she is having a mental breakdown.Denies active SI/HI/AH/VH.  States she hurts everywhere.  Denies fall/injury/trauma.      Past Medical History   Past Medical History:  Diagnosis Date   ADHD (attention deficit hyperactivity disorder)    Anxiety    Bipolar disorder ([place])    Depression    GERD (gastroesophageal reflux disease)    Hypertension    ODD (oppositional defiant disorder)      Active Problem List   Patient Active Problem List   Diagnosis Date Noted   Schizoaffective disorder, bipolar type ([place]) 03/02/2022   Seasonal allergic conjunctivitis 05/20/2018   Chronic back pain 05/20/2018   Borderline personality disorder ([place]) 11/26/2017   History of posttraumatic stress disorder (PTSD) 11/26/2017   GAD (generalized anxiety disorder) 07/27/2017     Past Surgical History   Past Surgical History:  Procedure Laterality Date   NO PAST SURGERIES       Home Medications   Prior to Admission medications   Medication Sig Start Date End Date Taking? Authorizing Provider  ARAZLO 0.045 % LOTN Apply 1 Application topically in the morning. 08/01/21   [provider]  atomoxetine (STRATTERA) 18 MG capsule Take 18 mg by mouth daily. 01/19/21   [provider]  Azelaic Acid 15 % gel Apply 1 Application topically every morning. 08/01/21   [provider]  fluticasone (FLONASE) 50 MCG/ACT nasal spray Place 1 spray into both nostrils daily. 07/07/21   [provider]  loratadine (CLARITIN) 10 MG tablet Take 1 tablet (10 mg total) by mouth daily as needed for allergies. 09/23/18   Lanae Boast, FNP  omeprazole (PRILOSEC) 40 MG capsule Take 1 capsule  (40 mg total) by mouth daily as needed (heartburn). 09/23/18   Lanae Boast, FNP  ondansetron (ZOFRAN) 4 MG tablet Take 4 mg by mouth 2 (two) times daily. 01/19/21   [provider]  sertraline (ZOLOFT) 50 MG tablet Take 50 mg by mouth daily. 01/19/21   [provider]  XENICAL 120 MG capsule Take 120 mg by mouth 3 (three) times daily. 07/28/21   [provider]  ziprasidone (GEODON) 20 MG capsule Take 20 mg by mouth at bedtime.    [provider]     Allergies  Patient has no known allergies.   Family History   Family History  Problem Relation Age of Onset   Diabetes Maternal Grandmother    Cancer Paternal Grandfather        type unknown     Physical Exam  Triage Vital Signs: ED Triage Vitals  Enc Vitals Group     BP 03/02/22 0513 (!) 143/87     Pulse Rate 03/02/22 0513 78     Resp 03/02/22 0513 18     Temp 03/02/22 0513 98 F (36.7 C)     Temp src --      SpO2 03/02/22 0513 98 %     Weight 03/02/22 0509 245 lb 6 oz (111.3 kg)     Height 03/02/22 0509 5' 2"$  (1.575 m)     Head Circumference --      Peak Flow --      Pain Score  03/02/22 0512 0     Pain Loc --      Pain Edu? --      Excl. in [place]? --     Updated Vital Signs: BP (!) 148/110 (BP Location: Left Arm)   Pulse 84   Temp 98.8 F (37.1 C)   Resp 20   Ht 5' 2"$  (1.575 m)   Wt 111.3 kg   LMP  (LMP Unknown)   SpO2 97%   BMI 44.88 kg/m    General: Awake, no distress.  CV:  RRR.  Good peripheral perfusion.  Resp:  Normal effort.  CTAB. Abd:  Nontender.  No distention.  Other:  Wide-eyed, staring.  Cooperative.  No external signs of injury.   ED Results / Procedures / Treatments  Labs (all labs ordered are listed, but only abnormal results are displayed) Labs Reviewed  COMPREHENSIVE METABOLIC PANEL - Abnormal; Notable for the following components:      Result Value   Sodium 134 (*)    Potassium 3.4 (*)    CO2 21 (*)    Glucose, Bld 111 (*)    ALT 53 (*)    All  other components within normal limits  SALICYLATE LEVEL - Abnormal; Notable for the following components:   Salicylate Lvl Q000111Q (*)    All other components within normal limits  ACETAMINOPHEN LEVEL - Abnormal; Notable for the following components:   Acetaminophen (Tylenol), Serum <10 (*)    All other components within normal limits  CBC - Abnormal; Notable for the following components:   WBC 11.3 (*)    RBC 5.24 (*)    HCT 46.9 (*)    All other components within normal limits  RESP PANEL BY RT-PCR (RSV, FLU A&B, COVID)  [id number]  ETHANOL  URINE DRUG SCREEN, QUALITATIVE ([facility] ONLY)  PREGNANCY, URINE     EKG  None   RADIOLOGY None   Official radiology report(s): No results found.   PROCEDURES:  Critical Care performed: No  Procedures   MEDICATIONS ORDERED IN ED: Medications  LORazepam (ATIVAN) tablet 1 mg (1 mg Oral Given 03/02/22 0751)  ziprasidone (GEODON) injection 10 mg (10 mg Intramuscular Given 03/02/22 1025)  gabapentin (NEURONTIN) capsule 200 mg (200 mg Oral Given 03/02/22 1522)  LORazepam (ATIVAN) tablet 1 mg (1 mg Oral Given 03/03/22 0517)     IMPRESSION / MDM / ASSESSMENT AND PLAN / ED COURSE  I reviewed the triage vital signs and the nursing notes.                             27 year old female presenting for behavioral medicine evaluation.  Contracts for safety while in the emergency department. The patient has been placed in psychiatric observation due to the need to provide a safe environment for the patient while obtaining psychiatric consultation and evaluation, as well as ongoing medical and medication management to treat the patient's condition.  The patient has not been placed under full IVC at this time.  Patient's presentation is most consistent with exacerbation of chronic illness.        FINAL CLINICAL IMPRESSION(S) / ED DIAGNOSES   Final diagnoses:  Bipolar affective disorder, currently depressed, moderate ([place])     Rx / DC Orders    ED Discharge Orders     None        Note:  This document was prepared using Dragon voice recognition software and may include unintentional dictation errors.  Paulette Blanch, MD 03/04/22 [phone]

## 2022-03-02 NOTE — ED Notes (Signed)
Pt took PO ativan willingly and appropriately. Pt is quiet now, pacing around her room. Encouraged decreased stimulation and verbal re-assurance of safety. Pt continues to appear to have visual hallucinations.

## 2022-03-02 NOTE — BH Assessment (Signed)
Per [place] [facility]), patient to be referred out of system.  Referral information for Psychiatric Hospitalization faxed to;   [facility] [phone]) No beds available  [facility] [phone]),   [facility] [phone]),  [address] [phone]),   [facility] [phone]- [phone]),   [facility] [phone])  [facility] [phone])

## 2022-03-02 NOTE — ED Notes (Signed)
Pt requesting medication for anxiety, hyperventilation noted.

## 2022-03-02 NOTE — ED Notes (Signed)
Pt given lunch tray- is currently sleeping. Room cleaned of trash.

## 2022-03-02 NOTE — ED Notes (Addendum)
Pt now sitting on her bed and has been able to self soothe from her anxiety at this time. EDP Mumma informed. EDP requests that we try to offer PO ativan once more, before administering ordered IM shot. Pt agreeable to take PO medication. Will hold Geodon as ordered by EDP at this time.

## 2022-03-02 NOTE — BH Assessment (Signed)
PATIENT BED AVAILABLE AFTER 8AM FOR 03/03/22  Patient has been accepted to [facility].  Patient assigned to [facility] Accepting physician is Dr. Kathlene Cote.  Call report to [phone].  Representative was Deon.   ER Staff is aware of it:  [place] ER Secretary  Dr. Starleen Blue, ER MD  [facility] Patient's Nurse

## 2022-03-03 MED ORDER — LORAZEPAM 1 MG PO TABS
1.0000 mg | ORAL_TABLET | Freq: Once | ORAL | Status: AC
  Administered 2022-03-03: 1 mg via ORAL
  Filled 2022-03-03: qty 1

## 2022-03-03 NOTE — ED Notes (Signed)
IVC/pending admission to [facility] after 8AM

## 2022-03-03 NOTE — ED Provider Notes (Signed)
Emergency Medicine Observation Re-evaluation Note  Barbara Benton is a 27 y.o. female, seen on rounds today.  Pt initially presented to the ED for complaints of Psychiatric Evaluation  Currently, the patient is is no acute distress. Denies any concerns at this time.  Physical Exam  Blood pressure 122/79, pulse (!) 102, temperature 98.7 F (37.1 C), resp. rate 18, height 5' 2"$  (1.575 m), weight 111.3 kg, SpO2 99 %.  Physical Exam: General: No apparent distress Pulm: Normal WOB Neuro: Moving all extremities Psych: Resting comfortably     ED Course / MDM     I have reviewed the labs performed to date as well as medications administered while in observation.  Recent changes in the last 24 hours include: No acute events overnight.  Plan   Current plan: Patient awaiting psychiatric disposition. Patient is under full IVC at this time.    Barbara Benton, [name], DO 03/03/22 [phone]

## 2022-03-03 NOTE — ED Notes (Signed)
EMTALA reviewed by this RN at this time.

## 2022-03-03 NOTE — ED Notes (Signed)
[facility], they will have to call back for report. Spoke with Product/process development scientist, she said "if we have to send the pt before report is given then that's just how it has to be."

## 2022-03-03 NOTE — ED Notes (Signed)
Pt requesting medication for anxiety. Ward DO notified

## 2022-03-16 ENCOUNTER — Other Ambulatory Visit: Payer: Self-pay

## 2022-03-16 ENCOUNTER — Emergency Department
Admission: EM | Admit: 2022-03-16 | Discharge: 2022-03-17 | Disposition: A | Discharge status: Home / Self Care | Payer: No Typology Code available for payment source | Attending: Emergency Medicine | Admitting: Emergency Medicine

## 2022-03-16 DIAGNOSIS — F29 Unspecified psychosis not due to a substance or known physiological condition: Secondary | ICD-10-CM

## 2022-03-16 DIAGNOSIS — G8929 Other chronic pain: Secondary | ICD-10-CM | POA: Diagnosis present

## 2022-03-16 DIAGNOSIS — F250 Schizoaffective disorder, bipolar type: Secondary | ICD-10-CM | POA: Diagnosis not present

## 2022-03-16 DIAGNOSIS — M549 Dorsalgia, unspecified: Secondary | ICD-10-CM | POA: Diagnosis not present

## 2022-03-16 DIAGNOSIS — F419 Anxiety disorder, unspecified: Secondary | ICD-10-CM | POA: Diagnosis present

## 2022-03-16 DIAGNOSIS — F411 Generalized anxiety disorder: Secondary | ICD-10-CM | POA: Diagnosis not present

## 2022-03-16 DIAGNOSIS — I10 Essential (primary) hypertension: Secondary | ICD-10-CM | POA: Insufficient documentation

## 2022-03-16 DIAGNOSIS — Z9149 Other personal history of psychological trauma, not elsewhere classified: Secondary | ICD-10-CM | POA: Diagnosis not present

## 2022-03-16 DIAGNOSIS — F32A Depression, unspecified: Secondary | ICD-10-CM | POA: Diagnosis not present

## 2022-03-16 DIAGNOSIS — F603 Borderline personality disorder: Secondary | ICD-10-CM | POA: Diagnosis not present

## 2022-03-16 DIAGNOSIS — F17210 Nicotine dependence, cigarettes, uncomplicated: Secondary | ICD-10-CM | POA: Insufficient documentation

## 2022-03-16 DIAGNOSIS — Z8659 Personal history of other mental and behavioral disorders: Secondary | ICD-10-CM

## 2022-03-16 DIAGNOSIS — R45851 Suicidal ideations: Secondary | ICD-10-CM | POA: Insufficient documentation

## 2022-03-16 DIAGNOSIS — Z711 Person with feared health complaint in whom no diagnosis is made: Secondary | ICD-10-CM | POA: Diagnosis not present

## 2022-03-16 LAB — URINE DRUG SCREEN, QUALITATIVE (ARMC ONLY)
Amphetamines, Ur Screen: NOT DETECTED
Barbiturates, Ur Screen: NOT DETECTED
Benzodiazepine, Ur Scrn: NOT DETECTED
Cannabinoid 50 Ng, Ur ~~LOC~~: NOT DETECTED
Cocaine Metabolite,Ur ~~LOC~~: NOT DETECTED
MDMA (Ecstasy)Ur Screen: NOT DETECTED
Methadone Scn, Ur: NOT DETECTED
Opiate, Ur Screen: NOT DETECTED
Phencyclidine (PCP) Ur S: NOT DETECTED
Tricyclic, Ur Screen: NOT DETECTED

## 2022-03-16 LAB — ETHANOL: Alcohol, Ethyl (B): <10 mg/dL (ref <10)

## 2022-03-16 LAB — SALICYLATE LEVEL: Salicylate Lvl: <7 mg/dL — ABNORMAL LOW (ref 7.0–30.0)

## 2022-03-16 LAB — URINALYSIS, ROUTINE W REFLEX MICROSCOPIC
Bacteria, UA: NONE SEEN
Bilirubin Urine: NEGATIVE
Glucose, UA: NEGATIVE mg/dL
Ketones, ur: NEGATIVE mg/dL
Leukocytes,Ua: NEGATIVE
Nitrite: NEGATIVE
Protein, ur: NEGATIVE mg/dL
Specific Gravity, Urine: 1.011 (ref 1.005–1.030)
pH: 6 (ref 5.0–8.0)

## 2022-03-16 LAB — COMPREHENSIVE METABOLIC PANEL
ALT: 73 U/L — ABNORMAL HIGH (ref 0–44)
AST: 47 U/L — ABNORMAL HIGH (ref 15–41)
Albumin: 4.4 g/dL (ref 3.5–5.0)
Alkaline Phosphatase: 102 U/L (ref 38–126)
Anion gap: 14 (ref 5–15)
BUN: 12 mg/dL (ref 6–20)
CO2: 23 mmol/L (ref 22–32)
Calcium: 9.5 mg/dL (ref 8.9–10.3)
Chloride: 101 mmol/L (ref 98–111)
Creatinine, Ser: 0.87 mg/dL (ref 0.44–1.00)
GFR, Estimated: >60 mL/min (ref >60)
Glucose, Bld: 103 mg/dL — ABNORMAL HIGH (ref 70–99)
Potassium: 3.5 mmol/L (ref 3.5–5.1)
Sodium: 138 mmol/L (ref 135–145)
Total Bilirubin: 0.9 mg/dL (ref 0.3–1.2)
Total Protein: 8.6 g/dL — ABNORMAL HIGH (ref 6.5–8.1)

## 2022-03-16 LAB — CBC
HCT: 43.2 % (ref 36.0–46.0)
Hemoglobin: 13.6 g/dL (ref 12.0–15.0)
MCH: 27.3 pg (ref 26.0–34.0)
MCHC: 31.5 g/dL (ref 30.0–36.0)
MCV: 86.7 fL (ref 80.0–100.0)
Platelets: 272 10*3/uL (ref 150–400)
RBC: 4.98 MIL/uL (ref 3.87–5.11)
RDW: 15.3 % (ref 11.5–15.5)
WBC: 6.7 10*3/uL (ref 4.0–10.5)
nRBC: 0 % (ref 0.0–0.2)

## 2022-03-16 LAB — POC URINE PREG, ED: Preg Test, Ur: NEGATIVE

## 2022-03-16 LAB — ACETAMINOPHEN LEVEL: Acetaminophen (Tylenol), Serum: <10 ug/mL — ABNORMAL LOW (ref 10–30)

## 2022-03-16 MED ORDER — LORAZEPAM 1 MG PO TABS
1.0000 mg | ORAL_TABLET | Freq: Once | ORAL | Status: AC
  Administered 2022-03-16: 1 mg via ORAL
  Filled 2022-03-16: qty 1

## 2022-03-16 MED ORDER — LORAZEPAM 2 MG/ML IJ SOLN
2.0000 mg | Freq: Once | INTRAMUSCULAR | Status: AC
  Administered 2022-03-16: 2 mg via INTRAMUSCULAR
  Filled 2022-03-16: qty 1

## 2022-03-16 MED ORDER — GABAPENTIN 100 MG PO CAPS
100.0000 mg | ORAL_CAPSULE | Freq: Three times a day (TID) | ORAL | Status: DC
  Administered 2022-03-16 – 2022-03-17 (×3): 100 mg via ORAL
  Filled 2022-03-16 (×3): qty 1

## 2022-03-16 MED ORDER — GUANFACINE HCL ER 1 MG PO TB24
1.0000 mg | ORAL_TABLET | Freq: Every day | ORAL | Status: DC
  Administered 2022-03-16 – 2022-03-17 (×2): 1 mg via ORAL
  Filled 2022-03-16 (×2): qty 1

## 2022-03-16 MED ORDER — LAMOTRIGINE 100 MG PO TABS
100.0000 mg | ORAL_TABLET | Freq: Two times a day (BID) | ORAL | Status: DC
  Administered 2022-03-17 (×2): 100 mg via ORAL
  Filled 2022-03-16 (×2): qty 1

## 2022-03-16 MED ORDER — TAZAROTENE 0.045 % EX LOTN: 1.0000 | TOPICAL_LOTION | Freq: Every morning | CUTANEOUS | Status: DC

## 2022-03-16 MED ORDER — CARIPRAZINE HCL 1.5 MG PO CAPS
4.5000 mg | ORAL_CAPSULE | Freq: Every day | ORAL | Status: DC
  Administered 2022-03-16: 4.5 mg via ORAL
  Filled 2022-03-16 (×2): qty 3

## 2022-03-16 MED ORDER — ARTIFICIAL TEARS OPHTHALMIC OINT
TOPICAL_OINTMENT | OPHTHALMIC | Status: DC | PRN
  Administered 2022-03-17: 1 via OPHTHALMIC
  Filled 2022-03-16: qty 3.5

## 2022-03-16 NOTE — ED Notes (Signed)
Hospital meal provided, pt tolerated w/o complaints.  Waste discarded appropriately.  

## 2022-03-16 NOTE — ED Notes (Signed)
IVC/pending psych consult

## 2022-03-16 NOTE — ED Notes (Signed)
Pt AOX4 in hallway recliner, NAD noted.

## 2022-03-16 NOTE — Consult Note (Signed)
[facility] Consult   Reason for Consult: Referring Physician:   Patient Identification: Barbara Benton MRN:  [id number] Principal Diagnosis: <principal problem not specified> Diagnosis:  Active Problems:   Chronic back pain   Borderline personality disorder ([place])   History of posttraumatic stress disorder (PTSD)   GAD (generalized anxiety disorder)   Schizoaffective disorder, bipolar type ([place])   Total Time spent with patient: 1 hour  Subjective:   Barbara Benton is a 27 y.o. female patient presented to [facility] ED on today from being discharged from [facility]. The patient was admitted on 02.17.2024. The patient stated that she does not have the medications that she was discharged on. The patient does need to get on her medications. The patient shared that she is un-housed. She was unable to share when she left the hospital. The patient is requesting long-term behavioral health admission.   This provider saw the patient face-to-face; the chart was reviewed, and Dr. Kerman Passey was consulted on 03/16/2022 due to the patient's care. It was discussed with the EDP that the patient remained under observation overnight. If in the a.m. to determine if she meets the criteria for psychiatric inpatient admission, he could be discharged. Upon evaluation, the patient is alert and oriented x3, calm, cooperative, and mood-congruent with affect. The patient does not appear to be responding to internal or external stimuli. The patient is presenting with some delusional thinking. The patient denies auditory or visual hallucinations. The patient admits to suicidal ideation but denies homicidal or self-harm ideations. The patient is not presenting with any psychotic or paranoid behaviors. During an encounter with the patient, she could not answer most questions appropriately.  HPI: Per Dr. Kerman Passey, Barbara Benton is a 27 y.o. female with a past medical history of anxiety, bipolar,  hypertension, presents emergency department with complaints of feeling "distraught."  According to the patient she states she recently went home from [facility] but is unable to tell me if it was in the last few days weeks months, etc.  Patient states she is feeling very "distraught" Per triage nurse patient was speaking to someone in the room who was not there, to myself patient is complaining of significant anxiety is asking for an injection of medication to help her relax.  Patient is pulling on her hair at times.  Patient also states she is having thoughts of killing herself.  No active plan.  Patient denies any medical complaints currently.  Patient has somewhat slow responses and chooses not to answer many of my questions.    Past Psychiatric History:  ADHD (attention deficit hyperactivity disorder) Anxiety Bipolar disorder (HCC) Depression ODD (oppositional defiant disorder)   Risk to Self:   Risk to Others:   Prior Inpatient Therapy:   Prior Outpatient Therapy:    Past Medical History:  Past Medical History:  Diagnosis Date   ADHD (attention deficit hyperactivity disorder)    Anxiety    Bipolar disorder ([place])    Depression    GERD (gastroesophageal reflux disease)    Hypertension    ODD (oppositional defiant disorder)     Past Surgical History:  Procedure Laterality Date   NO PAST SURGERIES     Family History:  Family History  Problem Relation Age of Onset   Diabetes Maternal Grandmother    Cancer Paternal Grandfather        type unknown   Family Psychiatric  History:  Social History:  Social History   Substance and Sexual Activity  Alcohol Use  Yes   Comment: occ     Social History   Substance and Sexual Activity  Drug Use Not Currently   Types: Marijuana, MDMA Banker)    Social History   Socioeconomic History   Marital status: Single    Spouse name: Not on file   Number of children: 0   Years of education: Not on file   Highest education level:  Not on file  Occupational History   Occupation: insurance agent  Tobacco Use   Smoking status: Every Day    Packs/day: 0.50    Types: Cigarettes   Smokeless tobacco: Never  Vaping Use   Vaping Use: Some days  Substance and Sexual Activity   Alcohol use: Yes    Comment: occ   Drug use: Not Currently    Types: Marijuana, MDMA (Ecstacy)   Sexual activity: Yes    Birth control/protection: Condom  Other Topics Concern   Not on file  Social History Narrative   Client lives alone; stated that she is on disability but getting a job.  Stated that she does not have a Teacher, music.   Social Determinants of Health   Financial Resource Strain: Not on file  Food Insecurity: Not on file  Transportation Needs: Not on file  Physical Activity: Not on file  Stress: Not on file  Social Connections: Not on file   Additional Social History:    Allergies:  No Known Allergies  Labs:  Results for orders placed or performed during the hospital encounter of 03/16/22 (from the past 48 hour(s))  Comprehensive metabolic panel     Status: Abnormal   Collection Time: 03/16/22  1:57 AM  Result Value Ref Range   Sodium 138 135 - 145 mmol/L   Potassium 3.5 3.5 - 5.1 mmol/L   Chloride 101 98 - 111 mmol/L   CO2 23 22 - 32 mmol/L   Glucose, Bld 103 (H) 70 - 99 mg/dL    Comment: Glucose reference range applies only to samples taken after fasting for at least 8 hours.   BUN 12 6 - 20 mg/dL   Creatinine, Ser 0.87 0.44 - 1.00 mg/dL   Calcium 9.5 8.9 - 10.3 mg/dL   Total Protein 8.6 (H) 6.5 - 8.1 g/dL   Albumin 4.4 3.5 - 5.0 g/dL   AST 47 (H) 15 - 41 U/L   ALT 73 (H) 0 - 44 U/L   Alkaline Phosphatase 102 38 - 126 U/L   Total Bilirubin 0.9 0.3 - 1.2 mg/dL   GFR, Estimated >60 >60 mL/min    Comment: (NOTE) Calculated using the CKD-EPI Creatinine Equation (2021)    Anion gap 14 5 - 15    Comment: Performed at [facility], [address] [place], [place] [address]  Ethanol     Status: None    Collection Time: 03/16/22  1:57 AM  Result Value Ref Range   Alcohol, Ethyl (B) <10 <10 mg/dL    Comment: (NOTE) Lowest detectable limit for serum alcohol is 10 mg/dL.  For medical purposes only. Performed at [facility], [address] [place], [place] [id number]   Salicylate level     Status: Abnormal   Collection Time: 03/16/22  1:57 AM  Result Value Ref Range   Salicylate Lvl Q000111Q (L) 7.0 - 30.0 mg/dL    Comment: Performed at [facility], [address], [place], [place] [address]  Acetaminophen level     Status: Abnormal   Collection Time: 03/16/22  1:57 AM  Result Value Ref Range   Acetaminophen (Tylenol), Serum <10 (L) 10 - 30 ug/mL    Comment: (NOTE) Therapeutic concentrations vary significantly. A range of 10-30 ug/mL  may be an effective concentration for many patients. However, some  are best treated at concentrations outside of this range. Acetaminophen concentrations >150 ug/mL at 4 hours after ingestion  and >50 ug/mL at 12 hours after ingestion are often associated with  toxic reactions.  Performed at [facility], [address] [place], [place] [address]   cbc     Status: None   Collection Time: 03/16/22  1:57 AM  Result Value Ref Range   WBC 6.7 4.0 - 10.5 K/uL   RBC 4.98 3.87 - 5.11 MIL/uL   Hemoglobin 13.6 12.0 - 15.0 g/dL   HCT 43.2 36.0 - 46.0 %   MCV 86.7 80.0 - 100.0 fL   MCH 27.3 26.0 - 34.0 pg   MCHC 31.5 30.0 - 36.0 g/dL   RDW 15.3 11.5 - 15.5 %   Platelets 272 150 - 400 K/uL   nRBC 0.0 0.0 - 0.2 %    Comment: Performed at [facility], [address] [place], [place] [address]  Urine Drug Screen, Qualitative     Status: None   Collection Time: 03/16/22  1:57 AM  Result Value Ref Range   Tricyclic, Ur Screen NONE DETECTED NONE DETECTED   Amphetamines, Ur Screen NONE DETECTED NONE DETECTED   MDMA (Ecstasy)Ur Screen NONE DETECTED NONE DETECTED   Cocaine Metabolite,Ur [place] NONE DETECTED NONE  DETECTED   Opiate, Ur Screen NONE DETECTED NONE DETECTED   Phencyclidine (PCP) Ur S NONE DETECTED NONE DETECTED   Cannabinoid 50 Ng, Ur [place] NONE DETECTED NONE DETECTED   Barbiturates, Ur Screen NONE DETECTED NONE DETECTED   Benzodiazepine, Ur Scrn NONE DETECTED NONE DETECTED   Methadone Scn, Ur NONE DETECTED NONE DETECTED    Comment: (NOTE) Tricyclics + metabolites, urine    Cutoff 1000 ng/mL Amphetamines + metabolites, urine  Cutoff 1000 ng/mL MDMA (Ecstasy), urine              Cutoff 500 ng/mL Cocaine Metabolite, urine          Cutoff 300 ng/mL Opiate + metabolites, urine        Cutoff 300 ng/mL Phencyclidine (PCP), urine         Cutoff 25 ng/mL Cannabinoid, urine                 Cutoff 50 ng/mL Barbiturates + metabolites, urine  Cutoff 200 ng/mL Benzodiazepine, urine              Cutoff 200 ng/mL Methadone, urine                   Cutoff 300 ng/mL  The urine drug screen provides only a preliminary, unconfirmed analytical test result and should not be used for non-medical purposes. Clinical consideration and professional judgment should be applied to any positive drug screen result due to possible interfering substances. A more specific alternate chemical method must be used in order to obtain a confirmed analytical result. Gas chromatography / mass spectrometry (GC/MS) is the preferred confirm atory method. Performed at [facility], [address] [place], [place] [address]   Urinalysis, Routine w reflex microscopic -Urine, Clean Catch     Status: Abnormal   Collection Time: 03/16/22  1:57 AM  Result Value Ref Range   Color, Urine YELLOW (A) YELLOW   APPearance CLEAR (  A) CLEAR   Specific Gravity, Urine 1.011 1.005 - 1.030   pH 6.0 5.0 - 8.0   Glucose, UA NEGATIVE NEGATIVE mg/dL   Hgb urine dipstick MODERATE (A) NEGATIVE   Bilirubin Urine NEGATIVE NEGATIVE   Ketones, ur NEGATIVE NEGATIVE mg/dL   Protein, ur NEGATIVE NEGATIVE mg/dL   Nitrite NEGATIVE NEGATIVE    Leukocytes,Ua NEGATIVE NEGATIVE   RBC / HPF 0-5 0 - 5 RBC/hpf   WBC, UA 0-5 0 - 5 WBC/hpf   Bacteria, UA NONE SEEN NONE SEEN   Squamous Epithelial / HPF 0-5 0 - 5 /HPF   Mucus PRESENT     Comment: Performed at [facility], [address] [place], [place] [address]  POC urine preg, ED     Status: None   Collection Time: 03/16/22  2:11 AM  Result Value Ref Range   Preg Test, Ur NEGATIVE NEGATIVE    Comment:        THE SENSITIVITY OF THIS METHODOLOGY IS >24 mIU/mL     No current facility-administered medications for this encounter.   Current Outpatient Medications  Medication Sig Dispense Refill   ARAZLO 0.045 % LOTN Apply 1 Application topically in the morning.     atomoxetine (STRATTERA) 18 MG capsule Take 18 mg by mouth daily.     Azelaic Acid 15 % gel Apply 1 Application topically every morning.     fluticasone (FLONASE) 50 MCG/ACT nasal spray Place 1 spray into both nostrils daily.     loratadine (CLARITIN) 10 MG tablet Take 1 tablet (10 mg total) by mouth daily as needed for allergies. 30 tablet 6   omeprazole (PRILOSEC) 40 MG capsule Take 1 capsule (40 mg total) by mouth daily as needed (heartburn). 30 capsule 3   ondansetron (ZOFRAN) 4 MG tablet Take 4 mg by mouth 2 (two) times daily.     sertraline (ZOLOFT) 50 MG tablet Take 50 mg by mouth daily.     XENICAL 120 MG capsule Take 120 mg by mouth 3 (three) times daily.     ziprasidone (GEODON) 20 MG capsule Take 20 mg by mouth at bedtime.      Musculoskeletal: Strength & Muscle Tone: within normal limits Gait & Station: normal Patient leans: N/A Psychiatric Specialty Exam:  Presentation  General Appearance:  Appropriate for Environment  Eye Contact: Good  Speech: Clear and Coherent  Speech Volume: Normal  Handedness: Right   Mood and Affect  Mood: Euthymic  Affect: Congruent   Thought Process  Thought Processes: Coherent  Descriptions of Associations:Intact  Orientation:Full (Time,  Place and Person)  Thought Content:Logical  History of Schizophrenia/Schizoaffective disorder:Yes  Duration of Psychotic Symptoms:Greater than six months  Hallucinations:Hallucinations: None  Ideas of Reference:None  Suicidal Thoughts:Suicidal Thoughts: Yes, Passive SI Passive Intent and/or Plan: With Intent; With Plan  Homicidal Thoughts:No data recorded  Sensorium  Memory: Immediate Good; Recent Good; Remote Good  Judgment: Fair  Insight: Fair   Materials engineer: Fair  Attention Span: Fair  Recall: [employer] of Knowledge: Fair  Language: Fair   Psychomotor Activity  Psychomotor Activity: Psychomotor Activity: Normal   Assets  Assets: Armed forces logistics/support/administrative officer; Desire for Improvement; Financial Resources/Insurance; Housing; Resilience; Social Support   Sleep  Sleep: Sleep: Fair Number of Hours of Sleep: 5   Physical Exam: Physical Exam Vitals and nursing note reviewed.  Constitutional:      Appearance: She is obese.  HENT:     Head: Normocephalic and atraumatic.     Right  Ear: External ear normal.     Left Ear: External ear normal.     Nose: Nose normal.     Mouth/Throat:     Mouth: Mucous membranes are moist.  Cardiovascular:     Rate and Rhythm: Normal rate.     Pulses: Normal pulses.  Pulmonary:     Effort: Pulmonary effort is normal.  Musculoskeletal:        General: Normal range of motion.     Cervical back: Normal range of motion.  Neurological:     General: No focal deficit present.     Mental Status: She is alert and oriented to person, place, and time. Mental status is at baseline.  Psychiatric:        Attention and Perception: Attention and perception normal.        Mood and Affect: Mood and affect normal.        Speech: Speech normal.        Behavior: Behavior normal. Behavior is cooperative.        Thought Content: Thought content includes suicidal ideation. Thought content includes suicidal plan.         Cognition and Memory: Cognition and memory normal.        Judgment: Judgment is inappropriate.    Review of Systems  Psychiatric/Behavioral:  Positive for suicidal ideas.    Blood pressure (!) 135/97, pulse 97, temperature 98.2 F (36.8 C), temperature source Oral, resp. rate 18, height '5\' 2"'$  (1.575 m), weight 113.4 kg, SpO2 100 %. Body mass index is 45.73 kg/m.  Treatment Plan Summary: Daily contact with patient to assess and evaluate symptoms and progress in treatment and Plan   The patient remained under observation overnight and if in the a.m. to determine if she meets the criteria for psychiatric inpatient admission; he could be discharged.  Disposition: Supportive therapy provided about ongoing stressors. The patient remained under observation overnight and if in the a.m. to determine if she meets the criteria for psychiatric inpatient admission; he could be discharged.  Caroline Sauger, NP 03/16/2022 4:27 AM

## 2022-03-16 NOTE — ED Notes (Signed)
EDP [place] notified in person that pt is requesting her home eye drops "so my eyes don't bleed" per pt and her "antibiotic cream".

## 2022-03-16 NOTE — ED Notes (Signed)
Pt talking to self looking down hallway but no one is there.

## 2022-03-16 NOTE — ED Notes (Signed)
Pt noted to be crying in chair- requesting her ADHD medications. When this RN educated patient that no medications were ordered at this time, pt states "you dont understand I have to call [facility]".

## 2022-03-16 NOTE — ED Notes (Signed)
Patient states she stayed out [employer] as long as she could which was 14 days. Patient did not get medications refilled.

## 2022-03-16 NOTE — ED Notes (Signed)
Received Vraylar from pharm; will give once pt next awake as is currently sleeping. Chest rise and fall noted.

## 2022-03-16 NOTE — BH Assessment (Signed)
Comprehensive Clinical Assessment (CCA) Screening, Triage and Referral Note  03/16/2022 Barbara Benton [id number] Recommendations for Services/Supports/Treatments: Consulted with Lynder Parents., NP, who recommended pt. be observed overnight and reassessed in the morning.  Barbara Benton is a 27 year old, English speaking, Black female with a hx of borderline personality disorder, PTSD, GAD, and Schizoaffective disorder, bipolar type. Pt [facility] to [facility] ED voluntarily due to concerns about worsening symptoms of depression and thoughts of SI. Per triage note: Pt reports having hallucinations and delusions. Reports feeling suicidal after getting out of [place].   Upon assessment, pt. asked if there was a hospital she could be admitted to for 9 months. Pt reported that she is homeless. Pt endorsed feeling depressed and thoughts of SI. Pt explained that her plan is to lay backward and choke on her vomit, as she has been vomiting, or wrap her sock around her neck to choke herself as ways to commit suicide. The pt. had poor insight and poor judgement. Pt appeared to be responding to internal or external stimuli. Pt presented with blocked speech had relevant thought processes. Pt presented with an anxious mood; affect was congruent. Pt's UDS was irrelevant.  Chief Complaint:  Chief Complaint  Patient presents with   Psychiatric Evaluation   Visit Diagnosis: Chronic back pain   Borderline personality disorder ([place])   History of posttraumatic stress disorder (PTSD)   GAD (generalized anxiety disorder)   Schizoaffective disorder, bipolar type ([place])  Patient Reported Information How did you hear about [place]? Legal System  What Is the Reason for Your Visit/Call Today? Pt reports having hallucinations and delusions. Reports feeling suicidal after getting out of [place].  How Long Has This Been Causing You Problems? <Week  What Do You Feel Would Help You the Most Today? Treatment for Depression or other  mood problem; Housing Assistance   Have You Recently Had Any Thoughts About Hurting Yourself? Yes  Are You Planning to Commit Suicide/Harm Yourself At This time? Yes   Have you Recently Had Thoughts About Hurting Someone [place]? No  Are You Planning to Harm Someone at This Time? No  Explanation: Pt sts her plan is to choke on her vomit since she's been vomiting   Have You Used Any Alcohol or Drugs in the Past 24 Hours? No  How Long Ago Did You Use Drugs or Alcohol? No data recorded What Did You Use and How Much? n/a   Do You Currently Have a Therapist/Psychiatrist? No  Name of Therapist/Psychiatrist: [facility]   Have You Been Recently Discharged From Any Office Practice or Programs? Yes  Explanation of Discharge From Practice/Program: Pt recently discharged from [facility] 2 weeks ago    CCA Screening Triage Referral Assessment Type of Contact: Face-to-Face  Telemedicine Service Delivery:   Is this Initial or Reassessment?   Date Telepsych consult ordered in [facility]:    Time Telepsych consult ordered in [facility]:    Location of Assessment: [facility] ED  Provider Location: [facility] ED    Collateral Involvement: None provided   Does Patient Have a [place]? No data recorded Name and Contact of Legal Guardian: No data recorded If Minor and Not Living with Parent(s), Who has Custody? n/a  Is CPS involved or ever been involved? Never  Is APS involved or ever been involved? Never   Patient Determined To Be At Risk for Harm To Self or Others Based on Review of Patient Reported Information or Presenting Complaint? No  Method: Plan without intent  Availability of Means: No access or NA  Intent: Vague intent or NA  Notification Required: No need or identified person  Additional Information for Danger to Others Potential: Active psychosis  Additional Comments for Danger to Others Potential: No data recorded Are There Guns or Other Weapons  in Your Home? No  Types of Guns/Weapons: n/a  Are These Weapons Safely Secured?                            -- (UTA)  Who Could Verify You Are Able To Have These Secured: N/A  Do You Have any Outstanding Charges, Pending Court Dates, Parole/Probation? N/A  Contacted To Inform of Risk of Harm To Self or Others: Other: Comment   Does Patient Present under Involuntary Commitment? Yes    [place] of Residence: [place]   Patient Currently Receiving the Following Services: Medication Management   Determination of Need: Emergent (2 hours)   Options For Referral: ED Visit   Discharge Disposition:     Barbara Benton, LCAS

## 2022-03-16 NOTE — ED Notes (Signed)
Patient belongings as follows:  2 brown paper bags with miscellaneous personal belongings 1 black backpack with miscellaneous personal belongings  1 jacket 1 pair shoes  1 pair socks  1 pair pants  1 shirt  2 bras 1 pain underwear 1 watch 2 hair ties   Patient changed out into hospital scrubs at this time with this RN and Wynelle Link, EDT as witness

## 2022-03-16 NOTE — ED Notes (Signed)
IVC  PENDING  PLACEMENT  CONSULT  DONE

## 2022-03-16 NOTE — ED Triage Notes (Signed)
Pt reports having hallucinations and delusions. Reports feeling suicidal after getting out of [place]. States she believes she is being stalked and harassed and that she was almost pushed off of a bridge. Pt states she is "distraught" and out of her mind. Pt noted to be responding to stimuli not present- having conversations with persons not present. Pt speaking with flight of ideas. Pt cooperative at this time.

## 2022-03-16 NOTE — ED Notes (Signed)
Pt provided with supplied to shower, bed linens changed

## 2022-03-16 NOTE — ED Notes (Signed)
Patient running fingers through hair. Appears patient has pulled some hair out and was laying on floor.

## 2022-03-16 NOTE — ED Provider Notes (Signed)
   [facility] Provider Note    Event Date/Time   First MD Initiated Contact with Patient 03/16/22 [phone]     (approximate)  History   Chief Complaint: Psychiatric Evaluation  HPI  Barbara Benton is a 27 y.o. female with a past medical history of anxiety, bipolar, hypertension, presents emergency department with complaints of feeling "distraught."  According to the patient she states she recently went home from [facility] but is unable to tell me if it was in the last few days weeks months, etc.  Patient states she is feeling very "distraught" Per triage nurse patient was speaking to someone in the room who was not there, to myself patient is complaining of significant anxiety is asking for an injection of medication to help her relax.  Patient is pulling on her hair at times.  Patient also states she is having thoughts of killing herself.  No active plan.  Patient denies any medical complaints currently.  Patient has somewhat slow responses and chooses not to answer many of my questions.  Physical Exam   Triage Vital Signs: ED Triage Vitals  Enc Vitals Group     BP 03/16/22 0153 (!) 135/97     Pulse Rate 03/16/22 0153 97     Resp 03/16/22 0153 18     Temp 03/16/22 0153 98.2 F (36.8 C)     Temp Source 03/16/22 0153 Oral     SpO2 03/16/22 0153 100 %     Weight 03/16/22 0154 250 lb (113.4 kg)     Height 03/16/22 0154 '5\' 2"'$  (1.575 m)     Head Circumference --      Peak Flow --      Pain Score 03/16/22 0154 2     Pain Loc --      Pain Edu? --      Excl. in [place]? --     Most recent vital signs: Vitals:   03/16/22 0153  BP: (!) 135/97  Pulse: 97  Resp: 18  Temp: 98.2 F (36.8 C)  SpO2: 100%    General: Awake, no distress.  CV:  Good peripheral perfusion.  Regular rate and rhythm  Resp:  Normal effort.  Equal breath sounds bilaterally.  Abd:  No distention.  Soft, nontender.  No rebound or guarding.  ED Results / Procedures / Treatments    MEDICATIONS ORDERED IN ED: Medications  LORazepam (ATIVAN) injection 2 mg (has no administration in time range)     IMPRESSION / MDM / ASSESSMENT AND PLAN / ED COURSE  I reviewed the triage vital signs and the nursing notes.  Patient's presentation is most consistent with acute presentation with potential threat to life or bodily function.  Patient presents emergency department for worsening anxiety reported auditory hallucinations/psychosis and suicidal thoughts.  Patient does appear anxious, she is pulling firmly on her hair at times is requesting an injection of medication we will administer 2 mg of IM Ativan.  Patient's lab work is largely nonrevealing with a overall reassuring chemistry with just slight LFT elevation, negative pregnancy test negative alcohol salicylate and acetaminophen, normal CBC including a normal white blood cell count and a normal urinalysis.  We will place the patient under an IVC and have psychiatry evaluate.  FINAL CLINICAL IMPRESSION(S) / ED DIAGNOSES   Psychosis Suicidal ideation    Note:  This document was prepared using Dragon voice recognition software and may include unintentional dictation errors.   Harvest Dark, MD 03/16/22 [phone]

## 2022-03-16 NOTE — ED Notes (Signed)
Hospital dinner tray with snacks and a fresh drink provided to pt. Pt encouraged to sit up in order to facilitate eating. Pt sat up and ate most of meal. Foam cup with ice and sprite provided, per pt request. Meal tray and cups disposed of properly at pts request.

## 2022-03-16 NOTE — Consult Note (Cosign Needed Addendum)
[facility] Face-to-Face Psychiatry Consult   Reason for Consult: Referring Physician:   Patient Identification: Barbara Benton MRN:  [id number] Principal Diagnosis: Schizoaffective disorder, bipolar type ([place]) Diagnosis:  Principal Problem:   Schizoaffective disorder, bipolar type ([place]) Active Problems:   Borderline personality disorder ([place])   History of posttraumatic stress disorder (PTSD)   GAD (generalized anxiety disorder)   Total Time spent with patient: 30 minutes  Client was upset that she has to give her friend sex to return to live there despite paying him this month's rent.  Her desire is to go somewhere to live as she is semi-homeless based on her current situation.  Endorses depression and anxiety with passive suicidal ideations.  No psychosis or substance use.  She was just discharged from [facility].  Medications will be restarted and evaluated tomorrow.  She wanted to press charges on her friend yesterday and did not get the opportunity.  No distress noted, medications restarted and will re-evaluate her symptoms tomorrow.  Subjective:   Barbara Benton is a 27 y.o. female patient presented to [facility] ED on today from being discharged from [facility]. The patient was admitted on 02.17.2024. The patient stated that she does not have the medications that she was discharged on. The patient does need to get on her medications. The patient shared that she is un-housed. She was unable to share when she left the hospital. The patient is requesting long-term behavioral health admission.   This provider saw the patient face-to-face; the chart was reviewed, and Dr. Kerman Passey was consulted on 03/16/2022 due to the patient's care. It was discussed with the EDP that the patient remained under observation overnight. If in the a.m. to determine if she meets the criteria for psychiatric inpatient admission, he could be discharged.  Upon evaluation, the patient is alert and oriented  x3, calm, cooperative, and mood-congruent with affect. The patient does not appear to be responding to internal or external stimuli. The patient is presenting with some delusional thinking. The patient denies auditory or visual hallucinations. The patient admits to suicidal ideation but denies homicidal or self-harm ideations. The patient is not presenting with any psychotic or paranoid behaviors. During an encounter with the patient, she could not answer most questions appropriately.  HPI: Per Dr. Kerman Passey, Barbara Benton is a 27 y.o. female with a past medical history of anxiety, bipolar, hypertension, presents emergency department with complaints of feeling "distraught."  According to the patient she states she recently went home from [facility] but is unable to tell me if it was in the last few days weeks months, etc.  Patient states she is feeling very "distraught" Per triage nurse patient was speaking to someone in the room who was not there, to myself patient is complaining of significant anxiety is asking for an injection of medication to help her relax.  Patient is pulling on her hair at times.  Patient also states she is having thoughts of killing herself.  No active plan.  Patient denies any medical complaints currently.  Patient has somewhat slow responses and chooses not to answer many of my questions.    Past Psychiatric History:  ADHD (attention deficit hyperactivity disorder) Anxiety Bipolar disorder (HCC) Depression ODD (oppositional defiant disorder)   Past Medical History:  Past Medical History:  Diagnosis Date   ADHD (attention deficit hyperactivity disorder)    Anxiety    Bipolar disorder (HCC)    Depression    GERD (gastroesophageal reflux disease)    Hypertension  ODD (oppositional defiant disorder)     Past Surgical History:  Procedure Laterality Date   NO PAST SURGERIES     Family History:  Family History  Problem Relation Age of Onset   Diabetes Maternal  Grandmother    Cancer Paternal Grandfather        type unknown   Family Psychiatric  History:  Social History:  Social History   Substance and Sexual Activity  Alcohol Use Yes   Comment: occ     Social History   Substance and Sexual Activity  Drug Use Not Currently   Types: Marijuana, MDMA Banker)    Social History   Socioeconomic History   Marital status: Single    Spouse name: Not on file   Number of children: 0   Years of education: Not on file   Highest education level: Not on file  Occupational History   Occupation: Medical illustrator  Tobacco Use   Smoking status: Every Day    Packs/day: 0.50    Types: Cigarettes   Smokeless tobacco: Never  Vaping Use   Vaping Use: Some days  Substance and Sexual Activity   Alcohol use: Yes    Comment: occ   Drug use: Not Currently    Types: Marijuana, MDMA (Ecstacy)   Sexual activity: Yes    Birth control/protection: Condom  Other Topics Concern   Not on file  Social History Narrative   Client lives alone; stated that she is on disability but getting a job.  Stated that she does not have a Teacher, music.   Social Determinants of Health   Financial Resource Strain: Not on file  Food Insecurity: Not on file  Transportation Needs: Not on file  Physical Activity: Not on file  Stress: Not on file  Social Connections: Not on file   Additional Social History:    Allergies:  No Known Allergies  Labs:  Results for orders placed or performed during the hospital encounter of 03/16/22 (from the past 48 hour(s))  Comprehensive metabolic panel     Status: Abnormal   Collection Time: 03/16/22  1:57 AM  Result Value Ref Range   Sodium 138 135 - 145 mmol/L   Potassium 3.5 3.5 - 5.1 mmol/L   Chloride 101 98 - 111 mmol/L   CO2 23 22 - 32 mmol/L   Glucose, Bld 103 (H) 70 - 99 mg/dL    Comment: Glucose reference range applies only to samples taken after fasting for at least 8 hours.   BUN 12 6 - 20 mg/dL   Creatinine, Ser 0.87  0.44 - 1.00 mg/dL   Calcium 9.5 8.9 - 10.3 mg/dL   Total Protein 8.6 (H) 6.5 - 8.1 g/dL   Albumin 4.4 3.5 - 5.0 g/dL   AST 47 (H) 15 - 41 U/L   ALT 73 (H) 0 - 44 U/L   Alkaline Phosphatase 102 38 - 126 U/L   Total Bilirubin 0.9 0.3 - 1.2 mg/dL   GFR, Estimated >60 >60 mL/min    Comment: (NOTE) Calculated using the CKD-EPI Creatinine Equation (2021)    Anion gap 14 5 - 15    Comment: Performed at [facility], [address] [place], [place] [address]  Ethanol     Status: None   Collection Time: 03/16/22  1:57 AM  Result Value Ref Range   Alcohol, Ethyl (B) <10 <10 mg/dL    Comment: (NOTE) Lowest detectable limit for serum alcohol is 10 mg/dL.  For medical purposes only. Performed at  [facility], [address], [place], [place] [id number]   Salicylate level     Status: Abnormal   Collection Time: 03/16/22  1:57 AM  Result Value Ref Range   Salicylate Lvl Q000111Q (L) 7.0 - 30.0 mg/dL    Comment: Performed at [facility], [address], [place] [address]  Acetaminophen level     Status: Abnormal   Collection Time: 03/16/22  1:57 AM  Result Value Ref Range   Acetaminophen (Tylenol), Serum <10 (L) 10 - 30 ug/mL    Comment: (NOTE) Therapeutic concentrations vary significantly. A range of 10-30 ug/mL  may be an effective concentration for many patients. However, some  are best treated at concentrations outside of this range. Acetaminophen concentrations >150 ug/mL at 4 hours after ingestion  and >50 ug/mL at 12 hours after ingestion are often associated with  toxic reactions.  Performed at [facility], [address] [place], [place] [address]   cbc     Status: None   Collection Time: 03/16/22  1:57 AM  Result Value Ref Range   WBC 6.7 4.0 - 10.5 K/uL   RBC 4.98 3.87 - 5.11 MIL/uL   Hemoglobin 13.6 12.0 - 15.0 g/dL   HCT 43.2 36.0 - 46.0 %   MCV 86.7 80.0 - 100.0 fL   MCH 27.3 26.0 - 34.0 pg   MCHC 31.5 30.0 - 36.0  g/dL   RDW 15.3 11.5 - 15.5 %   Platelets 272 150 - 400 K/uL   nRBC 0.0 0.0 - 0.2 %    Comment: Performed at [facility], [address] [place] [address] [place], [place] [address]  Urine Drug Screen, Qualitative     Status: None   Collection Time: 03/16/22  1:57 AM  Result Value Ref Range   Tricyclic, Ur Screen NONE DETECTED NONE DETECTED   Amphetamines, Ur Screen NONE DETECTED NONE DETECTED   MDMA (Ecstasy)Ur Screen NONE DETECTED NONE DETECTED   Cocaine Metabolite,Ur [place] NONE DETECTED NONE DETECTED   Opiate, Ur Screen NONE DETECTED NONE DETECTED   Phencyclidine (PCP) Ur S NONE DETECTED NONE DETECTED   Cannabinoid 50 Ng, Ur [place] NONE DETECTED NONE DETECTED   Barbiturates, Ur Screen NONE DETECTED NONE DETECTED   Benzodiazepine, Ur Scrn NONE DETECTED NONE DETECTED   Methadone Scn, Ur NONE DETECTED NONE DETECTED    Comment: (NOTE) Tricyclics + metabolites, urine    Cutoff 1000 ng/mL Amphetamines + metabolites, urine  Cutoff 1000 ng/mL MDMA (Ecstasy), urine              Cutoff 500 ng/mL Cocaine Metabolite, urine          Cutoff 300 ng/mL Opiate + metabolites, urine        Cutoff 300 ng/mL Phencyclidine (PCP), urine         Cutoff 25 ng/mL Cannabinoid, urine                 Cutoff 50 ng/mL Barbiturates + metabolites, urine  Cutoff 200 ng/mL Benzodiazepine, urine              Cutoff 200 ng/mL Methadone, urine                   Cutoff 300 ng/mL  The urine drug screen provides only a preliminary, unconfirmed analytical test result and should not be used for non-medical purposes. Clinical consideration and professional judgment should be applied to any positive drug screen result due to possible interfering substances. A more specific alternate  chemical method must be used in order to obtain a confirmed analytical result. Gas chromatography / mass spectrometry (GC/MS) is the preferred confirm atory method. Performed at [facility], [address] [place], [place] [address]    Urinalysis, Routine w reflex microscopic -Urine, Clean Catch     Status: Abnormal   Collection Time: 03/16/22  1:57 AM  Result Value Ref Range   Color, Urine YELLOW (A) YELLOW   APPearance CLEAR (A) CLEAR   Specific Gravity, Urine 1.011 1.005 - 1.030   pH 6.0 5.0 - 8.0   Glucose, UA NEGATIVE NEGATIVE mg/dL   Hgb urine dipstick MODERATE (A) NEGATIVE   Bilirubin Urine NEGATIVE NEGATIVE   Ketones, ur NEGATIVE NEGATIVE mg/dL   Protein, ur NEGATIVE NEGATIVE mg/dL   Nitrite NEGATIVE NEGATIVE   Leukocytes,Ua NEGATIVE NEGATIVE   RBC / HPF 0-5 0 - 5 RBC/hpf   WBC, UA 0-5 0 - 5 WBC/hpf   Bacteria, UA NONE SEEN NONE SEEN   Squamous Epithelial / HPF 0-5 0 - 5 /HPF   Mucus PRESENT     Comment: Performed at [facility], [address] [place], [place] [address]  POC urine preg, ED     Status: None   Collection Time: 03/16/22  2:11 AM  Result Value Ref Range   Preg Test, Ur NEGATIVE NEGATIVE    Comment:        THE SENSITIVITY OF THIS METHODOLOGY IS >24 mIU/mL     No current facility-administered medications for this encounter.   Current Outpatient Medications  Medication Sig Dispense Refill   ARAZLO 0.045 % LOTN Apply 1 Application topically in the morning.     Azelaic Acid 15 % gel Apply 1 Application topically every morning.     cephALEXin (KEFLEX) 500 MG capsule Take 500 mg by mouth 3 (three) times daily.     cetirizine (ZYRTEC) 10 MG tablet Take 10 mg by mouth daily.     fluticasone (FLONASE) 50 MCG/ACT nasal spray Place 1 spray into both nostrils daily.     guanFACINE (INTUNIV) 1 MG TB24 ER tablet Take 1 mg by mouth daily.     lamoTRIgine (LAMICTAL) 100 MG tablet Take 100 mg by mouth 2 (two) times daily.     loratadine (CLARITIN) 10 MG tablet Take 1 tablet (10 mg total) by mouth daily as needed for allergies. 30 tablet 6   Multiple Vitamin (MULTIVITAMIN) TABS Take 1 tablet by mouth daily.     omeprazole (PRILOSEC) 40 MG capsule Take 1 capsule (40 mg total) by mouth  daily as needed (heartburn). 30 capsule 3   phentermine (ADIPEX-P) 37.5 MG tablet Take 37.5 mg by mouth daily.     sertraline (ZOLOFT) 50 MG tablet Take 50 mg by mouth daily.     VRAYLAR 4.5 MG CAPS Take 1 capsule by mouth daily.     XENICAL 120 MG capsule Take 120 mg by mouth 3 (three) times daily.     atomoxetine (STRATTERA) 18 MG capsule Take 18 mg by mouth daily. (Patient not taking: Reported on 03/16/2022)     ondansetron (ZOFRAN) 4 MG tablet Take 4 mg by mouth 2 (two) times daily. (Patient not taking: Reported on 03/16/2022)     ziprasidone (GEODON) 20 MG capsule Take 20 mg by mouth at bedtime. (Patient not taking: Reported on 03/16/2022)      Musculoskeletal: Strength & Muscle Tone: within normal limits Gait & Station: normal  Psychiatric Specialty Exam: Physical Exam Vitals and nursing note reviewed.  Constitutional:      Appearance: She is obese.  HENT:     Head: Normocephalic and atraumatic.     Right Ear: External ear normal.     Left Ear: External ear normal.     Nose: Nose normal.     Mouth/Throat:     Mouth: Mucous membranes are moist.  Cardiovascular:     Rate and Rhythm: Normal rate.     Pulses: Normal pulses.  Pulmonary:     Effort: Pulmonary effort is normal.  Musculoskeletal:        General: Normal range of motion.     Cervical back: Normal range of motion.  Neurological:     General: No focal deficit present.     Mental Status: She is alert and oriented to person, place, and time. Mental status is at baseline.  Psychiatric:        Attention and Perception: Attention and perception normal.        Mood and Affect: Affect normal. Mood is anxious and depressed.        Speech: Speech normal.        Behavior: Behavior normal. Behavior is cooperative.        Thought Content: Thought content normal.        Cognition and Memory: Cognition and memory normal.        Judgment: Judgment is inappropriate.    Review of Systems  Psychiatric/Behavioral:  Positive for  depression. The patient is nervous/anxious.     Blood pressure 127/85, pulse 91, temperature 98.5 F (36.9 C), temperature source Oral, resp. rate 15, height '5\' 2"'$  (1.575 m), weight 113.4 kg, SpO2 97 %.Body mass index is 45.73 kg/m.  General Appearance: Casual  Eye Contact:  Good  Speech:  Normal Rate  Volume:  Normal  Mood:  Anxious and Depressed  Affect:  Congruent  Thought Process:  Coherent and Descriptions of Associations: Intact  Orientation:  Full (Time, Place, and Person)  Thought Content:  WDL and Logical  Suicidal Thoughts:  No  Homicidal Thoughts:  No  Memory:  Immediate;   Fair Recent;   Fair Remote;   Fair  Judgement:  Fair  Insight:  Fair  Psychomotor Activity:  Normal  Concentration:  Concentration: Fair and Attention Span: Fair  Recall:  Good  Fund of Knowledge:  Fair  Language:  Good  Akathisia:  No  Handed:  Right  AIMS (if indicated):     Assets:  Leisure Time Physical Health Resilience  ADL's:  Intact  Cognition:  WNL  Sleep:         Physical Exam: Physical Exam Vitals and nursing note reviewed.  Constitutional:      Appearance: She is obese.  HENT:     Head: Normocephalic and atraumatic.     Right Ear: External ear normal.     Left Ear: External ear normal.     Nose: Nose normal.     Mouth/Throat:     Mouth: Mucous membranes are moist.  Cardiovascular:     Rate and Rhythm: Normal rate.     Pulses: Normal pulses.  Pulmonary:     Effort: Pulmonary effort is normal.  Musculoskeletal:        General: Normal range of motion.     Cervical back: Normal range of motion.  Neurological:     General: No focal deficit present.     Mental Status: She is alert and oriented to person, place, and time. Mental status is at baseline.  Psychiatric:        Attention and Perception: Attention and perception normal.        Mood and Affect: Affect normal. Mood is anxious and depressed.        Speech: Speech normal.        Behavior: Behavior normal.  Behavior is cooperative.        Thought Content: Thought content normal.        Cognition and Memory: Cognition and memory normal.        Judgment: Judgment is inappropriate.   Review of Systems  Psychiatric/Behavioral:  Positive for depression. The patient is nervous/anxious.    Blood pressure 127/85, pulse 91, temperature 98.5 F (36.9 C), temperature source Oral, resp. rate 15, height '5\' 2"'$  (1.575 m), weight 113.4 kg, SpO2 97 %. Body mass index is 45.73 kg/m.  Treatment Plan Summary: Schizoaffective disorder, bipolar type: Lamictal 100 mg BID Vraylar 4.5 mg daily Guanfacine 1 mg at bedtime  Disposition: Supportive therapy provided about ongoing stressors. The patient remained under observation overnight and if in the a.m. to determine if she meets the criteria for psychiatric inpatient admission; he could be discharged.  Waylan Boga, NP 03/16/2022 2:06 PM

## 2022-03-16 NOTE — ED Notes (Signed)
Pt. Alert and oriented, warm and dry, in no distress. Pt. Denies HI, and AVH. Pt states having SI with plan to jump off bridge. Patient states she about fell off bridge cause a man was stalking her. Patient tearful and states she is going through a diabetic shock. Pt. Encouraged to let nursing staff know of any concerns or needs.

## 2022-03-17 ENCOUNTER — Emergency Department (EMERGENCY_DEPARTMENT_HOSPITAL)
Admission: EM | Admit: 2022-03-17 | Discharge: 2022-03-18 | Disposition: A | Discharge status: Home / Self Care | Payer: No Typology Code available for payment source | Source: Home / Self Care | Attending: Emergency Medicine | Admitting: Emergency Medicine

## 2022-03-17 ENCOUNTER — Other Ambulatory Visit: Payer: Self-pay

## 2022-03-17 DIAGNOSIS — R45851 Suicidal ideations: Secondary | ICD-10-CM | POA: Insufficient documentation

## 2022-03-17 DIAGNOSIS — F259 Schizoaffective disorder, unspecified: Secondary | ICD-10-CM | POA: Insufficient documentation

## 2022-03-17 DIAGNOSIS — Z711 Person with feared health complaint in whom no diagnosis is made: Secondary | ICD-10-CM

## 2022-03-17 DIAGNOSIS — F32A Depression, unspecified: Secondary | ICD-10-CM

## 2022-03-17 DIAGNOSIS — F603 Borderline personality disorder: Secondary | ICD-10-CM | POA: Diagnosis present

## 2022-03-17 MED ORDER — CEPACOL SORE THROAT SPRAY 0.1-33 % MT LIQD: 1.0000 | Freq: Two times a day (BID) | OROMUCOSAL | 0 refills | Status: AC | PRN

## 2022-03-17 MED ORDER — FAMOTIDINE 20 MG PO TABS: 20.0000 mg | ORAL_TABLET | Freq: Two times a day (BID) | ORAL | 0 refills | Status: DC

## 2022-03-17 MED ORDER — ERYTHROMYCIN 5 MG/GM OP OINT: 1.0000 | TOPICAL_OINTMENT | Freq: Every day | OPHTHALMIC | 0 refills | Status: AC

## 2022-03-17 NOTE — ED Provider Notes (Signed)
-----------------------------------------   12:05 PM on 03/17/2022 -----------------------------------------  The patient has been evaluated by psychiatry and cleared for discharge.  The IVC has been rescinded.  The patient reported some subacute to chronic sore throat and reflux, and waking up with purulent discharge from her eyes.  I have prescribed medications for these.  The patient is stable for discharge at this time.  Return precautions provided, and she expressed understanding.   Arta Silence, MD 03/17/22 [phone]

## 2022-03-17 NOTE — ED Notes (Signed)
Pt engaging in attention seeing behaviors, asks for help from all passing staff for various needs (wants medicated lotion and was told not available from pharmacy at time of request, thinks she may have cancer, worried about abx she was prescribed in past visits [facility], etc). Boundaries set that this RN should be primary point of contact and that volume of speaking in the hallway should be reduced. Pt responds poorly to these boundaries and becomes agitated. Orders for PRN ativan provided which patient agrees to take PO, states "finally you are giving me something to help me!!"

## 2022-03-17 NOTE — ED Notes (Signed)
Pt given sandwich tray 

## 2022-03-17 NOTE — ED Triage Notes (Signed)
Pt arrives POV with CC of bilateral eye pain that started three months ago. Pt stating, "I don't want to go to the psych side yet." Pt exhibiting flight of ideas.

## 2022-03-17 NOTE — ED Notes (Signed)
MD at bedside. Pt is now stating "I am suicidal and I need to be transferred to a mental hospital".

## 2022-03-17 NOTE — ED Notes (Signed)
Pt is continuously raising her voice and cussing at the staff and calling staff vulgar names. She was redirected to get her behavior and attitude under control. If she could not do so, she could be escorted out. Security is standing by as she is becoming hostile towards staff.

## 2022-03-17 NOTE — ED Notes (Signed)
Pt dressed out :  1 backpack 1 watch 1 pair of black shoes Pink pants White hospital underwear 1 flower pattern bra 1 orange shirt 1 green coat 1 pair of multi colored socks

## 2022-03-17 NOTE — ED Notes (Signed)
Patient is being discharged, she is using the phone trying to get transportation.

## 2022-03-17 NOTE — ED Provider Notes (Signed)
[facility] Provider Note    Event Date/Time   First MD Initiated Contact with Patient 03/17/22 2303     (approximate)   History   Suicidal Ideation   HPI  Barbara Benton is a 27 y.o. female  who presents to the emergency department today with primary complaint of suicidal ideation. The patient states that she has been feeling this way for a while. States that she was picked up by police tonight after being on a bridge where she was thinking about jumping. Was seen earlier today in the ER for psychiatric evaluation. Additionally the patient states that she is concerned because she has darkened skin under her eyes that has been present since she was 27 years old and is worried that the erythromycin ointment that was prescribed earlier today won't help the infection.       Physical Exam   Triage Vital Signs: ED Triage Vitals  Enc Vitals Group     BP --      Pulse --      Resp 03/17/22 2251 (!) 21     Temp --      Temp src --      SpO2 --      Weight --      Height 03/17/22 2235 '5\' 2"'$  (1.575 m)     Head Circumference --      Peak Flow --      Pain Score --      Pain Loc --      Pain Edu? --      Excl. in [place]? --     Most recent vital signs: Vitals:   03/17/22 2251  Resp: (!) 21   General: Awake, alert. CV:  Good peripheral perfusion. Regular rate and rhythm. Resp:  Normal effort. Lungs clear. Abd:  No distention.  Other:  Disorganized thinking. Endorses SI   ED Results / Procedures / Treatments   Labs (all labs ordered are listed, but only abnormal results are displayed) Labs Reviewed - No data to display   EKG  None   RADIOLOGY None   PROCEDURES:  Critical Care performed: No    MEDICATIONS ORDERED IN ED: Medications - No data to display   IMPRESSION / MDM / [place] / ED COURSE  I reviewed the triage vital signs and the nursing notes.                              Differential diagnosis includes, but is  not limited to, malingering, SI.  Patient's presentation is most consistent with acute presentation with potential threat to life or bodily function.  Patient presents to the emergency department today with primary complaint of suicidal ideation. Will have psychiatry evaluate. Also had concern about discoloration under her eyes that has been present for roughly 2 decades. Did discuss with patient that I did not think any antibiotic would help at this point.  The patient has been placed in psychiatric observation due to the need to provide a safe environment for the patient while obtaining psychiatric consultation and evaluation, as well as ongoing medical and medication management to treat the patient's condition.  The patient has not been placed under full IVC at this time.  Patient was seen by psychiatry team. At this time they do not feel patient is an acute threat to herself or others.    FINAL CLINICAL IMPRESSION(S) / ED DIAGNOSES   Final  diagnoses:  Depression, unspecified depression type     Note:  This document was prepared using Dragon voice recognition software and may include unintentional dictation errors.    Nance Pear, MD 03/17/22 Emmit Pomfret    Nance Pear, MD 03/18/22 [phone]

## 2022-03-17 NOTE — ED Notes (Addendum)
Pt becoming aggressive towards staff when denied snack

## 2022-03-17 NOTE — ED Notes (Signed)
Patient is calm and cooperative, states that she hopes to go home and that she has a ride, I let her know that she had not been discharged, but would keep her updated, Patient without any behavioral issues noted. Staff will continue to monitor.

## 2022-03-17 NOTE — ED Notes (Signed)
Pt given breakfast tray

## 2022-03-17 NOTE — ED Notes (Signed)
Pt returned from shower and returned all supplies given. Pt sitting in bed.

## 2022-03-17 NOTE — ED Notes (Signed)
Pt speaking to self.

## 2022-03-17 NOTE — ED Notes (Signed)
Pt given shower supplies, Pt in bathroom with door closed. Pt opens door with pants down, posterior facing the open door. Pt informed that she needed to close the door, Pt closes door and resumes shower.

## 2022-03-17 NOTE — ED Notes (Addendum)
RN to bedside to introduce self to pt. Pt is CAOx4 and in no acute distress. Pt is complaining about eye pain due to "extensive crying" but denies vision disturbances. Pt was just here today for psych eval and was DC. Pt states " I just need a proper eye exam".

## 2022-03-17 NOTE — ED Notes (Signed)
Pt allowed phone time and called father.

## 2022-03-17 NOTE — Consult Note (Signed)
[facility] Psychiatry Consult   Reason for Consult: suicidal ideations Referring Physician:  EDP Patient Identification: Barbara Benton MRN:  [id number] Principal Diagnosis: Schizoaffective disorder, bipolar type ([place]) Diagnosis:  Principal Problem:   Schizoaffective disorder, bipolar type ([place]) Active Problems:   Borderline personality disorder ([place])   History of posttraumatic stress disorder (PTSD)   GAD (generalized anxiety disorder)   Total Time spent with patient: 30 minutes  The client was up and showering earlier this morning.  Today, she has no suicidal ideations or homicidal ideations, hallucinations, or substance abuse.  She is ready to discharge with request for something for her eyes which the EDP met with her regarding the issue.  Rissa called a friend to come and get her as she was psych cleared.  03/16/22: Client was upset that she has to give her friend sex to return to live there despite paying him this month's rent.  Her desire is to go somewhere to live as she is semi-homeless based on her current situation.  Endorses depression and anxiety with passive suicidal ideations.  No psychosis or substance use.  She was just discharged from [facility].  Medications will be restarted and evaluated tomorrow.  She wanted to press charges on her friend yesterday and did not get the opportunity.  No distress noted, medications restarted and will re-evaluate her symptoms tomorrow.  Subjective on admission per Caroline Sauger:   Barbara Benton is a 27 y.o. female patient presented to [facility] ED on today from being discharged from [facility]. The patient was admitted on 02.17.2024. The patient stated that she does not have the medications that she was discharged on. The patient does need to get on her medications. The patient shared that she is un-housed. She was unable to share when she left the hospital. The patient is requesting long-term behavioral health  admission.   This provider saw the patient face-to-face; the chart was reviewed, and Dr. Kerman Passey was consulted on 03/16/2022 due to the patient's care. It was discussed with the EDP that the patient remained under observation overnight. If in the a.m. to determine if she meets the criteria for psychiatric inpatient admission, he could be discharged.  Upon evaluation, the patient is alert and oriented x3, calm, cooperative, and mood-congruent with affect. The patient does not appear to be responding to internal or external stimuli. The patient is presenting with some delusional thinking. The patient denies auditory or visual hallucinations. The patient admits to suicidal ideation but denies homicidal or self-harm ideations. The patient is not presenting with any psychotic or paranoid behaviors. During an encounter with the patient, she could not answer most questions appropriately.  HPI: Per Dr. Kerman Passey, Barbara Benton is a 27 y.o. female with a past medical history of anxiety, bipolar, hypertension, presents emergency department with complaints of feeling "distraught."  According to the patient she states she recently went home from old [facility] but is unable to tell me if it was in the last few days weeks months, etc.  Patient states she is feeling very "distraught" Per triage nurse patient was speaking to someone in the room who was not there, to myself patient is complaining of significant anxiety is asking for an injection of medication to help her relax.  Patient is pulling on her hair at times.  Patient also states she is having thoughts of killing herself.  No active plan.  Patient denies any medical complaints currently.  Patient has somewhat slow responses and chooses not to answer  many of my questions.    Past Psychiatric History:  ADHD (attention deficit hyperactivity disorder) Anxiety Bipolar disorder (HCC) Depression ODD (oppositional defiant disorder)   Past Medical History:   Past Medical History:  Diagnosis Date   ADHD (attention deficit hyperactivity disorder)    Anxiety    Bipolar disorder (HCC)    Depression    GERD (gastroesophageal reflux disease)    Hypertension    ODD (oppositional defiant disorder)     Past Surgical History:  Procedure Laterality Date   NO PAST SURGERIES     Family History:  Family History  Problem Relation Age of Onset   Diabetes Maternal Grandmother    Cancer Paternal Grandfather        type unknown   Family Psychiatric  History:  Social History:  Social History   Substance and Sexual Activity  Alcohol Use Yes   Comment: occ     Social History   Substance and Sexual Activity  Drug Use Not Currently   Types: Marijuana, MDMA Banker)    Social History   Socioeconomic History   Marital status: Single    Spouse name: Not on file   Number of children: 0   Years of education: Not on file   Highest education level: Not on file  Occupational History   Occupation: Medical illustrator  Tobacco Use   Smoking status: Every Day    Packs/day: 0.50    Types: Cigarettes   Smokeless tobacco: Never  Vaping Use   Vaping Use: Some days  Substance and Sexual Activity   Alcohol use: Yes    Comment: occ   Drug use: Not Currently    Types: Marijuana, MDMA (Ecstacy)   Sexual activity: Yes    Birth control/protection: Condom  Other Topics Concern   Not on file  Social History Narrative   Client lives alone; stated that she is on disability but getting a job.  Stated that she does not have a Teacher, music.   Social Determinants of Health   Financial Resource Strain: Not on file  Food Insecurity: Not on file  Transportation Needs: Not on file  Physical Activity: Not on file  Stress: Not on file  Social Connections: Not on file   Additional Social History:  lives with a friend    Allergies:  No Known Allergies  Labs:  Results for orders placed or performed during the hospital encounter of 03/16/22 (from the past  48 hour(s))  Comprehensive metabolic panel     Status: Abnormal   Collection Time: 03/16/22  1:57 AM  Result Value Ref Range   Sodium 138 135 - 145 mmol/L   Potassium 3.5 3.5 - 5.1 mmol/L   Chloride 101 98 - 111 mmol/L   CO2 23 22 - 32 mmol/L   Glucose, Bld 103 (H) 70 - 99 mg/dL    Comment: Glucose reference range applies only to samples taken after fasting for at least 8 hours.   BUN 12 6 - 20 mg/dL   Creatinine, Ser 0.87 0.44 - 1.00 mg/dL   Calcium 9.5 8.9 - 10.3 mg/dL   Total Protein 8.6 (H) 6.5 - 8.1 g/dL   Albumin 4.4 3.5 - 5.0 g/dL   AST 47 (H) 15 - 41 U/L   ALT 73 (H) 0 - 44 U/L   Alkaline Phosphatase 102 38 - 126 U/L   Total Bilirubin 0.9 0.3 - 1.2 mg/dL   GFR, Estimated >60 >60 mL/min    Comment: (NOTE) Calculated using the CKD-EPI Creatinine  Equation (2021)    Anion gap 14 5 - 15    Comment: Performed at [facility], [address] [place], [place] [address]  Ethanol     Status: None   Collection Time: 03/16/22  1:57 AM  Result Value Ref Range   Alcohol, Ethyl (B) <10 <10 mg/dL    Comment: (NOTE) Lowest detectable limit for serum alcohol is 10 mg/dL.  For medical purposes only. Performed at [facility], [address] [place], [place] [id number]   Salicylate level     Status: Abnormal   Collection Time: 03/16/22  1:57 AM  Result Value Ref Range   Salicylate Lvl Q000111Q (L) 7.0 - 30.0 mg/dL    Comment: Performed at [facility], [address] [place], [place] [address]  Acetaminophen level     Status: Abnormal   Collection Time: 03/16/22  1:57 AM  Result Value Ref Range   Acetaminophen (Tylenol), Serum <10 (L) 10 - 30 ug/mL    Comment: (NOTE) Therapeutic concentrations vary significantly. A range of 10-30 ug/mL  may be an effective concentration for many patients. However, some  are best treated at concentrations outside of this range. Acetaminophen concentrations >150 ug/mL at 4 hours after ingestion  and >50 ug/mL at 12  hours after ingestion are often associated with  toxic reactions.  Performed at [facility], [address] [place], [place] [address]   cbc     Status: None   Collection Time: 03/16/22  1:57 AM  Result Value Ref Range   WBC 6.7 4.0 - 10.5 K/uL   RBC 4.98 3.87 - 5.11 MIL/uL   Hemoglobin 13.6 12.0 - 15.0 g/dL   HCT 43.2 36.0 - 46.0 %   MCV 86.7 80.0 - 100.0 fL   MCH 27.3 26.0 - 34.0 pg   MCHC 31.5 30.0 - 36.0 g/dL   RDW 15.3 11.5 - 15.5 %   Platelets 272 150 - 400 K/uL   nRBC 0.0 0.0 - 0.2 %    Comment: Performed at [facility], [address] [place], [place] [address]  Urine Drug Screen, Qualitative     Status: None   Collection Time: 03/16/22  1:57 AM  Result Value Ref Range   Tricyclic, Ur Screen NONE DETECTED NONE DETECTED   Amphetamines, Ur Screen NONE DETECTED NONE DETECTED   MDMA (Ecstasy)Ur Screen NONE DETECTED NONE DETECTED   Cocaine Metabolite,Ur [place] NONE DETECTED NONE DETECTED   Opiate, Ur Screen NONE DETECTED NONE DETECTED   Phencyclidine (PCP) Ur S NONE DETECTED NONE DETECTED   Cannabinoid 50 Ng, Ur [place] NONE DETECTED NONE DETECTED   Barbiturates, Ur Screen NONE DETECTED NONE DETECTED   Benzodiazepine, Ur Scrn NONE DETECTED NONE DETECTED   Methadone Scn, Ur NONE DETECTED NONE DETECTED    Comment: (NOTE) Tricyclics + metabolites, urine    Cutoff 1000 ng/mL Amphetamines + metabolites, urine  Cutoff 1000 ng/mL MDMA (Ecstasy), urine              Cutoff 500 ng/mL Cocaine Metabolite, urine          Cutoff 300 ng/mL Opiate + metabolites, urine        Cutoff 300 ng/mL Phencyclidine (PCP), urine         Cutoff 25 ng/mL Cannabinoid, urine                 Cutoff 50 ng/mL Barbiturates + metabolites, urine  Cutoff 200 ng/mL Benzodiazepine, urine  Cutoff 200 ng/mL Methadone, urine                   Cutoff 300 ng/mL  The urine drug screen provides only a preliminary, unconfirmed analytical test result and should not be used for  non-medical purposes. Clinical consideration and professional judgment should be applied to any positive drug screen result due to possible interfering substances. A more specific alternate chemical method must be used in order to obtain a confirmed analytical result. Gas chromatography / mass spectrometry (GC/MS) is the preferred confirm atory method. Performed at [facility], [address] [place], [place] [address]   Urinalysis, Routine w reflex microscopic -Urine, Clean Catch     Status: Abnormal   Collection Time: 03/16/22  1:57 AM  Result Value Ref Range   Color, Urine YELLOW (A) YELLOW   APPearance CLEAR (A) CLEAR   Specific Gravity, Urine 1.011 1.005 - 1.030   pH 6.0 5.0 - 8.0   Glucose, UA NEGATIVE NEGATIVE mg/dL   Hgb urine dipstick MODERATE (A) NEGATIVE   Bilirubin Urine NEGATIVE NEGATIVE   Ketones, ur NEGATIVE NEGATIVE mg/dL   Protein, ur NEGATIVE NEGATIVE mg/dL   Nitrite NEGATIVE NEGATIVE   Leukocytes,Ua NEGATIVE NEGATIVE   RBC / HPF 0-5 0 - 5 RBC/hpf   WBC, UA 0-5 0 - 5 WBC/hpf   Bacteria, UA NONE SEEN NONE SEEN   Squamous Epithelial / HPF 0-5 0 - 5 /HPF   Mucus PRESENT     Comment: Performed at [facility], [address] [place], [place] [address]  POC urine preg, ED     Status: None   Collection Time: 03/16/22  2:11 AM  Result Value Ref Range   Preg Test, Ur NEGATIVE NEGATIVE    Comment:        THE SENSITIVITY OF THIS METHODOLOGY IS >24 mIU/mL     Current Facility-Administered Medications  Medication Dose Route Frequency Provider Last Rate Last Admin   artificial tears (LACRILUBE) ophthalmic ointment   Both Eyes Q3H PRN Carrie Mew, MD   1 Application at [id number]   cariprazine (VRAYLAR) capsule 4.5 mg  4.5 mg Oral Daily Patrecia Pour, NP   4.5 mg at 03/16/22 1821   gabapentin (NEURONTIN) capsule 100 mg  100 mg Oral TID Patrecia Pour, NP   100 mg at 03/17/22 0919   guanFACINE (INTUNIV) ER tablet 1 mg  1 mg Oral Daily  Patrecia Pour, NP   1 mg at 03/17/22 0919   lamoTRIgine (LAMICTAL) tablet 100 mg  100 mg Oral BID Patrecia Pour, NP   100 mg at 03/17/22 0920   Tazarotene A999333 % LOTN 1 Application  1 Application Topical q AM Carrie Mew, MD       Current Outpatient Medications  Medication Sig Dispense Refill   ARAZLO 0.045 % LOTN Apply 1 Application topically in the morning.     Azelaic Acid 15 % gel Apply 1 Application topically every morning.     cephALEXin (KEFLEX) 500 MG capsule Take 500 mg by mouth 3 (three) times daily.     cetirizine (ZYRTEC) 10 MG tablet Take 10 mg by mouth daily.     Dyclonine-Glycerin (CEPACOL SORE THROAT SPRAY) 0.1-33 % LIQD Use as directed 1 spray in the mouth or throat 2 (two) times daily as needed (sore throat). 118 mL 0   erythromycin ophthalmic ointment Place 1 Application into both eyes at bedtime for 3 days. 3.5 g 0  famotidine (PEPCID) 20 MG tablet Take 1 tablet (20 mg total) by mouth 2 (two) times daily. 30 tablet 0   fluticasone (FLONASE) 50 MCG/ACT nasal spray Place 1 spray into both nostrils daily.     guanFACINE (INTUNIV) 1 MG TB24 ER tablet Take 1 mg by mouth daily.     lamoTRIgine (LAMICTAL) 100 MG tablet Take 100 mg by mouth 2 (two) times daily.     loratadine (CLARITIN) 10 MG tablet Take 1 tablet (10 mg total) by mouth daily as needed for allergies. 30 tablet 6   Multiple Vitamin (MULTIVITAMIN) TABS Take 1 tablet by mouth daily.     omeprazole (PRILOSEC) 40 MG capsule Take 1 capsule (40 mg total) by mouth daily as needed (heartburn). 30 capsule 3   phentermine (ADIPEX-P) 37.5 MG tablet Take 37.5 mg by mouth daily.     sertraline (ZOLOFT) 50 MG tablet Take 50 mg by mouth daily.     VRAYLAR 4.5 MG CAPS Take 1 capsule by mouth daily.     XENICAL 120 MG capsule Take 120 mg by mouth 3 (three) times daily.     atomoxetine (STRATTERA) 18 MG capsule Take 18 mg by mouth daily. (Patient not taking: Reported on 03/16/2022)      Musculoskeletal: Strength & Muscle  Tone: within normal limits Gait & Station: normal  Psychiatric Specialty Exam: Physical Exam Vitals and nursing note reviewed.  Constitutional:      Appearance: She is obese.  HENT:     Head: Normocephalic.     Nose: Nose normal.  Cardiovascular:     Rate and Rhythm: Normal rate.     Pulses: Normal pulses.  Pulmonary:     Effort: Pulmonary effort is normal.  Musculoskeletal:        General: Normal range of motion.     Cervical back: Normal range of motion.  Neurological:     General: No focal deficit present.     Mental Status: She is alert and oriented to person, place, and time.  Psychiatric:        Attention and Perception: Attention and perception normal.        Mood and Affect: Affect normal. Mood is anxious and depressed.        Speech: Speech normal.        Behavior: Behavior normal. Behavior is cooperative.        Thought Content: Thought content normal.        Cognition and Memory: Cognition and memory normal.        Judgment: Judgment is inappropriate.     Review of Systems  Psychiatric/Behavioral:  Positive for depression. The patient is nervous/anxious.     Blood pressure 128/87, pulse 84, temperature 98.3 F (36.8 C), resp. rate 20, height '5\' 2"'$  (1.575 m), weight 113.4 kg, SpO2 100 %.Body mass index is 45.73 kg/m.  General Appearance: Casual  Eye Contact:  Good  Speech:  Normal Rate  Volume:  Normal  Mood:  Anxious and Depressed  Affect:  Congruent  Thought Process:  Coherent and Descriptions of Associations: Intact  Orientation:  Full (Time, Place, and Person)  Thought Content:  WDL and Logical  Suicidal Thoughts:  No  Homicidal Thoughts:  No  Memory:  Immediate;   Fair Recent;   Fair Remote;   Fair  Judgement:  Fair  Insight:  Fair  Psychomotor Activity:  Normal  Concentration:  Concentration: Fair and Attention Span: Fair  Recall:  Good  Fund of Knowledge:  Fair  Language:  Good  Akathisia:  No  Handed:  Right  AIMS (if indicated):      Assets:  Leisure Time Physical Health Resilience  ADL's:  Intact  Cognition:  WNL  Sleep:         Physical Exam: Physical Exam Vitals and nursing note reviewed.  Constitutional:      Appearance: She is obese.  HENT:     Head: Normocephalic.     Nose: Nose normal.  Cardiovascular:     Rate and Rhythm: Normal rate.     Pulses: Normal pulses.  Pulmonary:     Effort: Pulmonary effort is normal.  Musculoskeletal:        General: Normal range of motion.     Cervical back: Normal range of motion.  Neurological:     General: No focal deficit present.     Mental Status: She is alert and oriented to person, place, and time.  Psychiatric:        Attention and Perception: Attention and perception normal.        Mood and Affect: Affect normal. Mood is anxious and depressed.        Speech: Speech normal.        Behavior: Behavior normal. Behavior is cooperative.        Thought Content: Thought content normal.        Cognition and Memory: Cognition and memory normal.        Judgment: Judgment is inappropriate.    Review of Systems  Psychiatric/Behavioral:  Positive for depression. The patient is nervous/anxious.    Blood pressure 128/87, pulse 84, temperature 98.3 F (36.8 C), resp. rate 20, height '5\' 2"'$  (1.575 m), weight 113.4 kg, SpO2 100 %. Body mass index is 45.73 kg/m.  Treatment Plan Summary: Schizoaffective disorder, bipolar type: Lamictal 100 mg BID Vraylar 4.5 mg daily Guanfacine 1 mg at bedtime  Disposition: Discharge home, follow up with outpatient provider  Waylan Boga, NP 03/17/2022 12:42 PM

## 2022-03-17 NOTE — ED Notes (Signed)
Pt is yelling at staff and making demands. Pt was educated about waiting her turn to see the doctor and how she is not going to talk to staff any type of way.

## 2022-03-18 DIAGNOSIS — Z711 Person with feared health complaint in whom no diagnosis is made: Secondary | ICD-10-CM

## 2022-03-18 DIAGNOSIS — F603 Borderline personality disorder: Secondary | ICD-10-CM

## 2022-03-18 NOTE — ED Notes (Signed)
Psych team at bedside .
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
## 2022-03-18 NOTE — Consult Note (Signed)
[place] Psychiatry Consult   Reason for Consult:  Psychiatric evaluation Referring Physician:  Dr. Archie Balboa Patient Identification: Atiya Iliff MRN:  [id number] Principal Diagnosis: Borderline personality disorder [facility]) Diagnosis:  Principal Problem:   Borderline personality disorder ([place])   Total Time spent with patient: 45 minutes  Subjective:   " Im here because I have a lot going on"  HPI:  Barbara Benton, 27 y.o., female patient seen  by TTS and this provider; chart reviewed and consulted with Dr. Archie Balboa on 03/18/22.  On evaluation Barbara Benton reports that she is here because she is suicidal.  However, her initial complaint was eye pain.  Per chart review, patient was seen here earlier in the day for a similar presentation and was discharged. Patient was recently admitted to [facility] and discharged.  She stayed at [facility] for 14 days (as long as she could) said the patient.  according to patient. Tonight she is requesting to be placed in a long term facility because of her "mental problems"  Patient is currently homeless and using the hospital as secondary gain for housing.   This patient has a history of endorsing SI, which always resolves once housing is arranged for her. She has been admitted 6 times within 6 months. While on the unit, she has demonstrated no unsafe behavior. There is no evidence of a psychiatric condition which is amendable to inpatient psychiatric hospitalization. Patient's SI appears to be conditional and is a means of manipulation and attention seeking without a true intention to harm self. Her behavior is more consistent with someone who is acting in self-preservation, rather than someone who is acutely suicidal.    Disposition:  Patient is psychiatrically cleared   Past Psychiatric History: Borderline personality  Risk to Self:   Risk to Others:   Prior Inpatient Therapy:   Prior Outpatient Therapy:    Past Medical  History:  Past Medical History:  Diagnosis Date   ADHD (attention deficit hyperactivity disorder)    Anxiety    Bipolar disorder (HCC)    Depression    GERD (gastroesophageal reflux disease)    Hypertension    ODD (oppositional defiant disorder)     Past Surgical History:  Procedure Laterality Date   NO PAST SURGERIES     Family History:  Family History  Problem Relation Age of Onset   Diabetes Maternal Grandmother    Cancer Paternal Grandfather        type unknown   Family Psychiatric  History:  Social History:  Social History   Substance and Sexual Activity  Alcohol Use Yes   Comment: occ     Social History   Substance and Sexual Activity  Drug Use Not Currently   Types: Marijuana, MDMA Banker)    Social History   Socioeconomic History   Marital status: Single    Spouse name: Not on file   Number of children: 0   Years of education: Not on file   Highest education level: Not on file  Occupational History   Occupation: Medical illustrator  Tobacco Use   Smoking status: Every Day    Packs/day: 0.50    Types: Cigarettes   Smokeless tobacco: Never  Vaping Use   Vaping Use: Some days  Substance and Sexual Activity   Alcohol use: Yes    Comment: occ   Drug use: Not Currently    Types: Marijuana, MDMA (Ecstacy)   Sexual activity: Yes    Birth control/protection: Condom  Other Topics Concern   Not on file  Social History Narrative   Client lives alone; stated that she is on disability but getting a job.  Stated that she does not have a Teacher, music.   Social Determinants of Health   Financial Resource Strain: Not on file  Food Insecurity: Not on file  Transportation Needs: Not on file  Physical Activity: Not on file  Stress: Not on file  Social Connections: Not on file   Additional Social History:    Allergies:  No Known Allergies  Labs:  Results for orders placed or performed during the hospital encounter of 03/16/22 (from the past 48 hour(s))   Comprehensive metabolic panel     Status: Abnormal   Collection Time: 03/16/22  1:57 AM  Result Value Ref Range   Sodium 138 135 - 145 mmol/L   Potassium 3.5 3.5 - 5.1 mmol/L   Chloride 101 98 - 111 mmol/L   CO2 23 22 - 32 mmol/L   Glucose, Bld 103 (H) 70 - 99 mg/dL    Comment: Glucose reference range applies only to samples taken after fasting for at least 8 hours.   BUN 12 6 - 20 mg/dL   Creatinine, Ser 0.87 0.44 - 1.00 mg/dL   Calcium 9.5 8.9 - 10.3 mg/dL   Total Protein 8.6 (H) 6.5 - 8.1 g/dL   Albumin 4.4 3.5 - 5.0 g/dL   AST 47 (H) 15 - 41 U/L   ALT 73 (H) 0 - 44 U/L   Alkaline Phosphatase 102 38 - 126 U/L   Total Bilirubin 0.9 0.3 - 1.2 mg/dL   GFR, Estimated >60 >60 mL/min    Comment: (NOTE) Calculated using the CKD-EPI Creatinine Equation (2021)    Anion gap 14 5 - 15    Comment: Performed at [facility], [address] [place], [place] [address]  Ethanol     Status: None   Collection Time: 03/16/22  1:57 AM  Result Value Ref Range   Alcohol, Ethyl (B) <10 <10 mg/dL    Comment: (NOTE) Lowest detectable limit for serum alcohol is 10 mg/dL.  For medical purposes only. Performed at [facility], [address] [place], [place] [id number]   Salicylate level     Status: Abnormal   Collection Time: 03/16/22  1:57 AM  Result Value Ref Range   Salicylate Lvl Q000111Q (L) 7.0 - 30.0 mg/dL    Comment: Performed at [facility], [address] [place], [place] [address]  Acetaminophen level     Status: Abnormal   Collection Time: 03/16/22  1:57 AM  Result Value Ref Range   Acetaminophen (Tylenol), Serum <10 (L) 10 - 30 ug/mL    Comment: (NOTE) Therapeutic concentrations vary significantly. A range of 10-30 ug/mL  may be an effective concentration for many patients. However, some  are best treated at concentrations outside of this range. Acetaminophen concentrations >150 ug/mL at 4 hours after ingestion  and >50 ug/mL at 12 hours after  ingestion are often associated with  toxic reactions.  Performed at [facility], [address] [place], [place] [address]   cbc     Status: None   Collection Time: 03/16/22  1:57 AM  Result Value Ref Range   WBC 6.7 4.0 - 10.5 K/uL   RBC 4.98 3.87 - 5.11 MIL/uL   Hemoglobin 13.6 12.0 - 15.0 g/dL   HCT 43.2 36.0 - 46.0 %   MCV 86.7 80.0 - 100.0 fL  MCH 27.3 26.0 - 34.0 pg   MCHC 31.5 30.0 - 36.0 g/dL   RDW 15.3 11.5 - 15.5 %   Platelets 272 150 - 400 K/uL   nRBC 0.0 0.0 - 0.2 %    Comment: Performed at [facility], [address] [place], [place] [address]  Urine Drug Screen, Qualitative     Status: None   Collection Time: 03/16/22  1:57 AM  Result Value Ref Range   Tricyclic, Ur Screen NONE DETECTED NONE DETECTED   Amphetamines, Ur Screen NONE DETECTED NONE DETECTED   MDMA (Ecstasy)Ur Screen NONE DETECTED NONE DETECTED   Cocaine Metabolite,Ur [place] NONE DETECTED NONE DETECTED   Opiate, Ur Screen NONE DETECTED NONE DETECTED   Phencyclidine (PCP) Ur S NONE DETECTED NONE DETECTED   Cannabinoid 50 Ng, Ur  NONE DETECTED NONE DETECTED   Barbiturates, Ur Screen NONE DETECTED NONE DETECTED   Benzodiazepine, Ur Scrn NONE DETECTED NONE DETECTED   Methadone Scn, Ur NONE DETECTED NONE DETECTED    Comment: (NOTE) Tricyclics + metabolites, urine    Cutoff 1000 ng/mL Amphetamines + metabolites, urine  Cutoff 1000 ng/mL MDMA (Ecstasy), urine              Cutoff 500 ng/mL Cocaine Metabolite, urine          Cutoff 300 ng/mL Opiate + metabolites, urine        Cutoff 300 ng/mL Phencyclidine (PCP), urine         Cutoff 25 ng/mL Cannabinoid, urine                 Cutoff 50 ng/mL Barbiturates + metabolites, urine  Cutoff 200 ng/mL Benzodiazepine, urine              Cutoff 200 ng/mL Methadone, urine                   Cutoff 300 ng/mL  The urine drug screen provides only a preliminary, unconfirmed analytical test result and should not be used for non-medical purposes.  Clinical consideration and professional judgment should be applied to any positive drug screen result due to possible interfering substances. A more specific alternate chemical method must be used in order to obtain a confirmed analytical result. Gas chromatography / mass spectrometry (GC/MS) is the preferred confirm atory method. Performed at [facility], [address] [place], [place] [address]   Urinalysis, Routine w reflex microscopic -Urine, Clean Catch     Status: Abnormal   Collection Time: 03/16/22  1:57 AM  Result Value Ref Range   Color, Urine YELLOW (A) YELLOW   APPearance CLEAR (A) CLEAR   Specific Gravity, Urine 1.011 1.005 - 1.030   pH 6.0 5.0 - 8.0   Glucose, UA NEGATIVE NEGATIVE mg/dL   Hgb urine dipstick MODERATE (A) NEGATIVE   Bilirubin Urine NEGATIVE NEGATIVE   Ketones, ur NEGATIVE NEGATIVE mg/dL   Protein, ur NEGATIVE NEGATIVE mg/dL   Nitrite NEGATIVE NEGATIVE   Leukocytes,Ua NEGATIVE NEGATIVE   RBC / HPF 0-5 0 - 5 RBC/hpf   WBC, UA 0-5 0 - 5 WBC/hpf   Bacteria, UA NONE SEEN NONE SEEN   Squamous Epithelial / HPF 0-5 0 - 5 /HPF   Mucus PRESENT     Comment: Performed at [facility], [address] [place], [place] [address]  POC urine preg, ED     Status: None   Collection Time: 03/16/22  2:11 AM  Result Value Ref Range   Preg Test, Ur NEGATIVE  NEGATIVE    Comment:        THE SENSITIVITY OF THIS METHODOLOGY IS >24 mIU/mL     No current facility-administered medications for this encounter.   Current Outpatient Medications  Medication Sig Dispense Refill   ARAZLO 0.045 % LOTN Apply 1 Application topically in the morning.     atomoxetine (STRATTERA) 18 MG capsule Take 18 mg by mouth daily. (Patient not taking: Reported on 03/16/2022)     Azelaic Acid 15 % gel Apply 1 Application topically every morning.     cephALEXin (KEFLEX) 500 MG capsule Take 500 mg by mouth 3 (three) times daily.     cetirizine (ZYRTEC) 10 MG tablet Take 10 mg  by mouth daily.     Dyclonine-Glycerin (CEPACOL SORE THROAT SPRAY) 0.1-33 % LIQD Use as directed 1 spray in the mouth or throat 2 (two) times daily as needed (sore throat). 118 mL 0   erythromycin ophthalmic ointment Place 1 Application into both eyes at bedtime for 3 days. 3.5 g 0   famotidine (PEPCID) 20 MG tablet Take 1 tablet (20 mg total) by mouth 2 (two) times daily. 30 tablet 0   fluticasone (FLONASE) 50 MCG/ACT nasal spray Place 1 spray into both nostrils daily.     guanFACINE (INTUNIV) 1 MG TB24 ER tablet Take 1 mg by mouth daily.     lamoTRIgine (LAMICTAL) 100 MG tablet Take 100 mg by mouth 2 (two) times daily.     loratadine (CLARITIN) 10 MG tablet Take 1 tablet (10 mg total) by mouth daily as needed for allergies. 30 tablet 6   Multiple Vitamin (MULTIVITAMIN) TABS Take 1 tablet by mouth daily.     omeprazole (PRILOSEC) 40 MG capsule Take 1 capsule (40 mg total) by mouth daily as needed (heartburn). 30 capsule 3   phentermine (ADIPEX-P) 37.5 MG tablet Take 37.5 mg by mouth daily.     sertraline (ZOLOFT) 50 MG tablet Take 50 mg by mouth daily.     VRAYLAR 4.5 MG CAPS Take 1 capsule by mouth daily.     XENICAL 120 MG capsule Take 120 mg by mouth 3 (three) times daily.      Musculoskeletal: Strength & Muscle Tone: within normal limits Gait & Station: normal Patient leans: Right   Psychiatric Specialty Exam:  Presentation  General Appearance:  Appropriate for Environment; Casual  Eye Contact: Fair  Speech: Clear and Coherent  Speech Volume: Normal  Handedness: Right   Mood and Affect  Mood: Angry; Irritable; Labile  Affect: Appropriate; Congruent; Labile   Thought Process  Thought Processes: Coherent  Descriptions of Associations:Intact  Orientation:Full (Time, Place and Person)  Thought Content:Logical; WDL  History of Schizophrenia/Schizoaffective disorder:No  Duration of Psychotic Symptoms:N/A  Hallucinations:Hallucinations: None  Ideas of  Reference:None  Suicidal Thoughts:Suicidal Thoughts: Yes, Passive SI Passive Intent and/or Plan: Without Intent  Homicidal Thoughts:Homicidal Thoughts: No   Sensorium  Memory: Immediate Good; Recent Good; Remote Good  Judgment: Fair  Insight: Fair   Materials engineer: Fair  Attention Span: Fair  Recall: [place] of Knowledge: Fair  Language: Fair   Psychomotor Activity  Psychomotor Activity: Psychomotor Activity: Normal   Assets  Assets: Armed forces logistics/support/administrative officer; Physical Health; Social Support   Sleep  Sleep: Sleep: Fair   Physical Exam: Physical Exam Vitals and nursing note reviewed.  Constitutional:      Appearance: Normal appearance.  HENT:     Head: Normocephalic and atraumatic.     Nose: Nose normal.  Mouth/Throat:     Mouth: Mucous membranes are dry.  Eyes:     Pupils: Pupils are equal, round, and reactive to light.  Pulmonary:     Effort: Pulmonary effort is normal.  Musculoskeletal:        General: Normal range of motion.     Cervical back: Normal range of motion.  Skin:    General: Skin is warm and dry.  Neurological:     General: No focal deficit present.     Mental Status: She is alert and oriented to person, place, and time.  Psychiatric:        Attention and Perception: Attention and perception normal.        Mood and Affect: Mood and affect normal.        Speech: Speech normal.        Behavior: Behavior is agitated and combative.        Cognition and Memory: Cognition and memory normal.        Judgment: Judgment normal.    Review of Systems  All other systems reviewed and are negative.  Blood pressure 135/75, pulse (!) 104, temperature 98.3 F (36.8 C), temperature source Oral, resp. rate 20, height '5\' 2"'$  (1.575 m), SpO2 100 %. Body mass index is 45.73 kg/m.   Disposition: Supportive therapy provided about ongoing stressors. Discussed crisis plan, support from social network, calling 911,  coming to the Emergency Department, and calling Suicide Hotline. Patient to follow-up with Felicie Morn, NP 03/18/2022 12:33 AM

## 2022-03-18 NOTE — ED Notes (Signed)
Pt given back her belongings.

## 2022-03-18 NOTE — ED Notes (Signed)
Pt demanding a room for privacy. This RN informed her we didn't have an available room.

## 2022-04-11 ENCOUNTER — Ambulatory Visit: Payer: Medicaid Other | Admitting: Gastroenterology

## 2022-04-11 ENCOUNTER — Telehealth: Payer: Self-pay

## 2022-04-11 ENCOUNTER — Encounter: Payer: Self-pay | Admitting: Gastroenterology

## 2022-04-14 ENCOUNTER — Encounter: Payer: Self-pay | Admitting: Emergency Medicine

## 2022-04-14 ENCOUNTER — Other Ambulatory Visit: Payer: Self-pay

## 2022-04-14 ENCOUNTER — Emergency Department
Admission: EM | Admit: 2022-04-14 | Discharge: 2022-04-14 | Disposition: A | Discharge status: Home / Self Care | Payer: Medicaid Other | Attending: Emergency Medicine | Admitting: Emergency Medicine

## 2022-04-14 DIAGNOSIS — L309 Dermatitis, unspecified: Secondary | ICD-10-CM | POA: Diagnosis not present

## 2022-04-14 DIAGNOSIS — L400 Psoriasis vulgaris: Secondary | ICD-10-CM | POA: Diagnosis present

## 2022-04-14 MED ORDER — TRIAMCINOLONE ACETONIDE 0.1 % EX CREA: 1.0000 | TOPICAL_CREAM | Freq: Four times a day (QID) | CUTANEOUS | 0 refills | Status: AC

## 2022-04-14 MED ORDER — DEXAMETHASONE SODIUM PHOSPHATE 10 MG/ML IJ SOLN
10.0000 mg | Freq: Once | INTRAMUSCULAR | Status: AC
  Administered 2022-04-14: 10 mg via INTRAMUSCULAR
  Filled 2022-04-14: qty 1

## 2022-04-14 NOTE — ED Provider Notes (Signed)
[facility] Provider Note  Patient Contact: 4:42 PM (approximate)   History   Psoriasis   HPI  Barbara Benton is a 27 y.o. female who presents emergency department complaining of psoriasis to her arms, face, feet.  Patient states that she has a history of psoriasis and typically uses topicals which rarely according to her help her symptoms.  She states that she typically will get a shot when things flared for her.  Patient denies any other complaints at this time.     Physical Exam   Triage Vital Signs: ED Triage Vitals [04/14/22 1609]  Enc Vitals Group     BP (!) 144/82     Pulse Rate 90     Resp 18     Temp 98.3 F (36.8 C)     Temp Source Oral     SpO2 99 %     Weight      Height      Head Circumference      Peak Flow      Pain Score 7     Pain Loc      Pain Edu?      Excl. in [place]?     Most recent vital signs: Vitals:   04/14/22 1609  BP: (!) 144/82  Pulse: 90  Resp: 18  Temp: 98.3 F (36.8 C)  SpO2: 99%     General: Alert and in no acute distress.  Cardiovascular:  Good peripheral perfusion Respiratory: Normal respiratory effort without tachypnea or retractions. Lungs CTAB.  Musculoskeletal: Full range of motion to all extremities.  Neurologic:  No gross focal neurologic deficits are appreciated.  Skin:   No rash noted.  Dry flaky/scaly skin identified on exam.  No evidence of psoriasis.  Patient does have few areas consistent with eczema. Other:   ED Results / Procedures / Treatments   Labs (all labs ordered are listed, but only abnormal results are displayed) Labs Reviewed - No data to display   EKG     RADIOLOGY    No results found.  PROCEDURES:  Critical Care performed: No  Procedures   MEDICATIONS ORDERED IN ED: Medications  dexamethasone (DECADRON) injection 10 mg (has no administration in time range)     IMPRESSION / MDM / ASSESSMENT AND PLAN / ED COURSE  I reviewed the triage vital signs and  the nursing notes.                                 Differential diagnosis includes, but is not limited to, dermatitis, allergic reaction, psoriasis, eczema   Patient's presentation is most consistent with acute presentation with potential threat to life or bodily function.   Patient's diagnosis is consistent with dermatitis.  Patient presents emergency department complaining of itchy skin to the face, forearms and feet.  Patient states that she has a history of psoriasis and gets shots when she has flares.  On physical exam I did not see any evidence of skin findings consistent with psoriasis.  Several areas seem consistent with eczema.  I recommended a good moisturizer and will give the patient a shot of steroids as she is adamant she needs a shot to help with the itching.  Will prescribe a topical steroid for use for her forearms and feet but have recommended not using this on her face.  Follow-up primary care as needed..  Patient is given ED precautions to return to  the ED for any worsening or new symptoms.     FINAL CLINICAL IMPRESSION(S) / ED DIAGNOSES   Final diagnoses:  Dermatitis     Rx / DC Orders   ED Discharge Orders          Ordered    triamcinolone cream (KENALOG) 0.1 %  4 times daily        04/14/22 1648             Note:  This document was prepared using Dragon voice recognition software and may include unintentional dictation errors.   Brynda Peon 04/14/22 1648    Blake Divine, MD 04/14/22 [id number]

## 2022-04-14 NOTE — ED Triage Notes (Signed)
Patient to ED via POV for a psoriasis flair up. Patient states she has itching under eyes and tops of feet "for a while." Recently missed her shot for same. Patient states she is here for medical and not psych. Denies SI or HI.

## 2022-04-19 ENCOUNTER — Emergency Department
Admission: EM | Admit: 2022-04-19 | Discharge: 2022-04-19 | Disposition: A | Discharge status: Home / Self Care | Payer: Medicaid Other | Attending: Emergency Medicine | Admitting: Emergency Medicine

## 2022-04-19 DIAGNOSIS — T7421XA Adult sexual abuse, confirmed, initial encounter: Secondary | ICD-10-CM | POA: Diagnosis not present

## 2022-04-19 DIAGNOSIS — I10 Essential (primary) hypertension: Secondary | ICD-10-CM | POA: Diagnosis not present

## 2022-04-19 DIAGNOSIS — R102 Pelvic and perineal pain: Secondary | ICD-10-CM | POA: Diagnosis present

## 2022-04-19 LAB — URINALYSIS, ROUTINE W REFLEX MICROSCOPIC
Bilirubin Urine: NEGATIVE
Glucose, UA: NEGATIVE mg/dL
Hgb urine dipstick: NEGATIVE
Ketones, ur: NEGATIVE mg/dL
Leukocytes,Ua: NEGATIVE
Nitrite: NEGATIVE
Protein, ur: NEGATIVE mg/dL
Specific Gravity, Urine: 1.019 (ref 1.005–1.030)
pH: 7 (ref 5.0–8.0)

## 2022-04-19 LAB — PREGNANCY, URINE: Preg Test, Ur: NEGATIVE

## 2022-04-19 LAB — CHLAMYDIA/NGC RT PCR (ARMC ONLY)
Chlamydia Tr: NOT DETECTED
N gonorrhoeae: NOT DETECTED

## 2022-04-19 LAB — WET PREP, GENITAL
Clue Cells Wet Prep HPF POC: NONE SEEN
Sperm: NONE SEEN
Trich, Wet Prep: NONE SEEN
WBC, Wet Prep HPF POC: <10 (ref <10)
Yeast Wet Prep HPF POC: NONE SEEN

## 2022-04-19 MED ORDER — DOXYCYCLINE HYCLATE 100 MG PO CAPS: 100.0000 mg | ORAL_CAPSULE | Freq: Two times a day (BID) | ORAL | 0 refills | Status: AC

## 2022-04-19 MED ORDER — CEFTRIAXONE SODIUM 1 G IJ SOLR
500.0000 mg | Freq: Once | INTRAMUSCULAR | Status: AC
  Administered 2022-04-19: 500 mg via INTRAMUSCULAR
  Filled 2022-04-19: qty 10

## 2022-04-19 NOTE — ED Notes (Addendum)
This RN at bedside with EDP [place] with pelvic cart for exam. Pt notified urine sample need asap.

## 2022-04-19 NOTE — ED Notes (Signed)
Pt declined offer of [facility] referral.
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
## 2022-04-19 NOTE — ED Notes (Signed)
Pt up to restroom.
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
## 2022-04-19 NOTE — ED Notes (Signed)
Pt up to restroom in attempt to provide urine sample.  
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
## 2022-04-20 LAB — HIV ANTIBODY (ROUTINE TESTING W REFLEX): HIV Screen 4th Generation wRfx: NONREACTIVE

## 2022-04-25 NOTE — Telephone Encounter (Signed)
error 

## 2022-05-07 ENCOUNTER — Other Ambulatory Visit: Payer: Self-pay

## 2022-05-07 ENCOUNTER — Emergency Department
Admission: EM | Admit: 2022-05-07 | Discharge: 2022-05-07 | Disposition: A | Discharge status: Home / Self Care | Payer: Medicaid Other | Attending: Emergency Medicine | Admitting: Emergency Medicine

## 2022-05-07 ENCOUNTER — Emergency Department: Payer: Medicaid Other

## 2022-05-07 DIAGNOSIS — N939 Abnormal uterine and vaginal bleeding, unspecified: Secondary | ICD-10-CM | POA: Insufficient documentation

## 2022-05-07 LAB — BASIC METABOLIC PANEL
Anion gap: 7 (ref 5–15)
BUN: 12 mg/dL (ref 6–20)
CO2: 25 mmol/L (ref 22–32)
Calcium: 8.9 mg/dL (ref 8.9–10.3)
Chloride: 107 mmol/L (ref 98–111)
Creatinine, Ser: 0.88 mg/dL (ref 0.44–1.00)
GFR, Estimated: >60 mL/min (ref >60)
Glucose, Bld: 102 mg/dL — ABNORMAL HIGH (ref 70–99)
Potassium: 3.8 mmol/L (ref 3.5–5.1)
Sodium: 139 mmol/L (ref 135–145)

## 2022-05-07 LAB — CBC
HCT: 38.3 % (ref 36.0–46.0)
Hemoglobin: 11.8 g/dL — ABNORMAL LOW (ref 12.0–15.0)
MCH: 26.5 pg (ref 26.0–34.0)
MCHC: 30.8 g/dL (ref 30.0–36.0)
MCV: 86.1 fL (ref 80.0–100.0)
Platelets: 377 10*3/uL (ref 150–400)
RBC: 4.45 MIL/uL (ref 3.87–5.11)
RDW: 13.6 % (ref 11.5–15.5)
WBC: 7.3 10*3/uL (ref 4.0–10.5)
nRBC: 0 % (ref 0.0–0.2)

## 2022-05-07 LAB — URINALYSIS, ROUTINE W REFLEX MICROSCOPIC
Bilirubin Urine: NEGATIVE
Glucose, UA: NEGATIVE mg/dL
Ketones, ur: 5 mg/dL — AB
Leukocytes,Ua: NEGATIVE
Nitrite: NEGATIVE
Protein, ur: NEGATIVE mg/dL
Specific Gravity, Urine: 1.019 (ref 1.005–1.030)
pH: 5 (ref 5.0–8.0)

## 2022-05-07 LAB — ABO/RH: ABO/RH(D): B POS

## 2022-05-07 LAB — HCG, QUANTITATIVE, PREGNANCY: hCG, Beta Chain, Quant, S: <1 m[IU]/mL (ref <5)

## 2022-05-07 NOTE — ED Notes (Signed)
Pt provided gown and undressed self from waist down. Chucks placed on bed.

## 2022-05-07 NOTE — ED Provider Notes (Addendum)
[facility] Provider Note   Event Date/Time   First MD Initiated Contact with Patient 05/07/22 1817     (approximate) History  Vaginal Bleeding  HPI Barbara Benton is a 27 y.o. female with a stated past medical history of PTSD, GAD, schizoaffective disorder, and ADHD who presents complaining of persistent blood clots after a period that began 3 weeks prior to arrival.  Patient states that she is sexually active and does not use contraception frequently.  Patient states she is also concerned that she is having differences in her memory after starting a new medication for ADHD.  Patient denies any abdominal pain. ROS: Patient currently denies any vision changes, tinnitus, difficulty speaking, facial droop, sore throat, chest pain, shortness of breath, abdominal pain, nausea/vomiting/diarrhea, dysuria, or weakness/numbness/paresthesias in any extremity   Physical Exam  Triage Vital Signs: ED Triage Vitals  Enc Vitals Group     BP 05/07/22 1538 (!) 150/108     Pulse Rate 05/07/22 1538 85     Resp 05/07/22 1538 16     Temp 05/07/22 1538 98.5 F (36.9 C)     Temp Source 05/07/22 1538 Oral     SpO2 05/07/22 1538 100 %     Weight 05/07/22 1539 245 lb (111.1 kg)     Height 05/07/22 1539  (1.575 m)     Head Circumference --      Peak Flow --      Pain Score 05/07/22 1539 10     Pain Loc --      Pain Edu? --      Excl. in [place]? --    Most recent vital signs: Vitals:   05/07/22 1538  BP: (!) 150/108  Pulse: 85  Resp: 16  Temp: 98.5 F (36.9 C)  SpO2: 100%   General: Awake, oriented x4. CV:  Good peripheral perfusion.  Resp:  Normal effort.  Abd:  No distention.  Pelvic exam deferred due to patient preference Other:  Obese young adult African-American female laying in bed in no acute distress ED Results / Procedures / Treatments  Labs (all labs ordered are listed, but only abnormal results are displayed) Labs Reviewed  CBC - Abnormal; Notable for the  following components:      Result Value   Hemoglobin 11.8 (*)    All other components within normal limits  BASIC METABOLIC PANEL - Abnormal; Notable for the following components:   Glucose, Bld 102 (*)    All other components within normal limits  URINALYSIS, ROUTINE W REFLEX MICROSCOPIC - Abnormal; Notable for the following components:   Color, Urine YELLOW (*)    APPearance HAZY (*)    Hgb urine dipstick LARGE (*)    Ketones, ur 5 (*)    Bacteria, UA RARE (*)    All other components within normal limits  HCG, QUANTITATIVE, PREGNANCY  ABO/RH   RADIOLOGY ED MD interpretation: Transit abdominal pelvic ultrasound interpreted independently by me and is normal without any evidence of acute abnormalities. -Agree with radiology assessment Official radiology report(s): US PELVIS (TRANSABDOMINAL ONLY)  Result Date: 05/07/2022 CLINICAL DATA:  Abnormal vaginal bleeding for 3 weeks. Negative beta hCG. EXAM: ULTRASOUND PELVIS TRANSVAGINAL TECHNIQUE: Transvaginal ultrasound examination of the pelvis was performed including evaluation of the uterus, ovaries, adnexal regions, and pelvic cul-de-sac. COMPARISON:  None Available. FINDINGS: Uterus Measurements: 6.7 x 3 x 4.4 cm = volume: 47 mL. No fibroids or other mass visualized. Endometrium Thickness: 5 mm.  No focal abnormality visualized.  Right ovary Measurements: 2.9 x 1.7 x 1.8 cm = volume: 4 mL. Normal appearance/no adnexal mass. Left ovary Measurements: 2.8 x 1.8 x 1.4 cm = volume: 4 mL. Normal appearance/no adnexal mass. Other findings:  No abnormal free fluid IMPRESSION: Normal examination. Electronically Signed   By: Burman Nieves M.D.   On: 05/07/2022 19:35   PROCEDURES: Critical Care performed: No .1-3 Lead EKG Interpretation  Performed by: Merwyn Katos, MD Authorized by: Merwyn Katos, MD     Interpretation: normal     ECG rate:  71   ECG rate assessment: normal     Rhythm: sinus rhythm     Ectopy: none     Conduction: normal     MEDICATIONS ORDERED IN ED: Medications - No data to display IMPRESSION / MDM / ASSESSMENT AND PLAN / ED COURSE  I reviewed the triage vital signs and the nursing notes.                             Patient's presentation is most consistent with acute presentation with potential threat to life or bodily function. This patient presents with 20 days vaginal bleeding most likely of nonemergent etiology. ED Workup: CBC, BMP, UA, bHCG, Type&Screen Based on History, Exam, and ED Workup patients presentation not consistent with ectopic pregnancy, molar pregnancy, life-threatening coagulopathy, trauma, serious bacterial infection, central process or other emergency. Most likely, patients bleeding is secondary to fibroids or other non-emergent cause of abnormal uterine bleeding. Disposition: Will discharge home with return precautions and instruction for prompt OBGYN follow up.   FINAL CLINICAL IMPRESSION(S) / ED DIAGNOSES   Final diagnoses:  Abnormal vaginal bleeding   Rx / DC Orders   ED Discharge Orders     None      Note:  This document was prepared using Dragon voice recognition software and may include unintentional dictation errors.   Merwyn Katos, MD 05/07/22 1950    Merwyn Katos, MD 05/07/22 [phone]

## 2022-05-07 NOTE — ED Notes (Signed)
ED Provider at bedside. 

## 2022-05-07 NOTE — ED Triage Notes (Signed)
Pt presents to  ED from home C/O blood clots, memory loss, and requesting dermatitis shot. Pt reports she has been menstruating since 4/8 and noticed some blood clots today. Pt presents with flight of ideas with known psychiatric hx. Denies SI/HI today.

## 2022-05-08 DIAGNOSIS — Z0441 Encounter for examination and observation following alleged adult rape: Secondary | ICD-10-CM | POA: Diagnosis not present

## 2022-05-08 DIAGNOSIS — T7421XA Adult sexual abuse, confirmed, initial encounter: Secondary | ICD-10-CM | POA: Insufficient documentation
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
## 2022-05-09 ENCOUNTER — Emergency Department
Admission: EM | Admit: 2022-05-09 | Discharge: 2022-05-09 | Disposition: A | Discharge status: Home / Self Care | Payer: Medicaid Other | Attending: Emergency Medicine | Admitting: Emergency Medicine

## 2022-05-09 ENCOUNTER — Ambulatory Visit
Admission: EM | Admit: 2022-05-09 | Discharge: 2022-05-09 | Disposition: A | Discharge status: Home / Self Care | Payer: No Typology Code available for payment source | Source: Ambulatory Visit | Attending: Emergency Medicine | Admitting: Emergency Medicine

## 2022-05-09 DIAGNOSIS — T7421XA Adult sexual abuse, confirmed, initial encounter: Secondary | ICD-10-CM

## 2022-05-09 DIAGNOSIS — Z0441 Encounter for examination and observation following alleged adult rape: Secondary | ICD-10-CM | POA: Diagnosis present

## 2022-05-09 LAB — RAPID HIV SCREEN (HIV 1/2 AB+AG)
HIV 1/2 Antibodies: NONREACTIVE
HIV-1 P24 Antigen - HIV24: NONREACTIVE

## 2022-05-09 LAB — COMPREHENSIVE METABOLIC PANEL
ALT: 14 U/L (ref 0–44)
AST: 14 U/L — ABNORMAL LOW (ref 15–41)
Albumin: 3.4 g/dL — ABNORMAL LOW (ref 3.5–5.0)
Alkaline Phosphatase: 76 U/L (ref 38–126)
Anion gap: 9 (ref 5–15)
BUN: 12 mg/dL (ref 6–20)
CO2: 22 mmol/L (ref 22–32)
Calcium: 8.8 mg/dL — ABNORMAL LOW (ref 8.9–10.3)
Chloride: 107 mmol/L (ref 98–111)
Creatinine, Ser: 0.75 mg/dL (ref 0.44–1.00)
GFR, Estimated: >60 mL/min (ref >60)
Glucose, Bld: 95 mg/dL (ref 70–99)
Potassium: 3.6 mmol/L (ref 3.5–5.1)
Sodium: 138 mmol/L (ref 135–145)
Total Bilirubin: 0.5 mg/dL (ref 0.3–1.2)
Total Protein: 6.5 g/dL (ref 6.5–8.1)

## 2022-05-09 LAB — HEPATITIS B SURFACE ANTIGEN: Hepatitis B Surface Ag: NONREACTIVE

## 2022-05-09 LAB — HEPATITIS C ANTIBODY: HCV Ab: NONREACTIVE

## 2022-05-09 LAB — RPR: RPR Ser Ql: NONREACTIVE

## 2022-05-09 MED ORDER — IBUPROFEN 600 MG PO TABS
600.0000 mg | ORAL_TABLET | Freq: Once | ORAL | Status: AC
  Administered 2022-05-09: 600 mg via ORAL
  Filled 2022-05-09: qty 1

## 2022-05-09 MED ORDER — AZITHROMYCIN 500 MG PO TABS
1000.0000 mg | ORAL_TABLET | Freq: Once | ORAL | Status: AC
  Administered 2022-05-09: 1000 mg via ORAL
  Filled 2022-05-09: qty 2

## 2022-05-09 MED ORDER — METRONIDAZOLE 500 MG PO TABS
2000.0000 mg | ORAL_TABLET | Freq: Once | ORAL | Status: AC
  Administered 2022-05-09: 2000 mg via ORAL
  Filled 2022-05-09: qty 4

## 2022-05-09 MED ORDER — ULIPRISTAL ACETATE 30 MG PO TABS
30.0000 mg | ORAL_TABLET | Freq: Once | ORAL | Status: AC
  Administered 2022-05-09: 30 mg via ORAL
  Filled 2022-05-09: qty 1

## 2022-05-09 MED ORDER — ACETAMINOPHEN 500 MG PO TABS
1000.0000 mg | ORAL_TABLET | Freq: Once | ORAL | Status: AC
  Administered 2022-05-09: 1000 mg via ORAL
  Filled 2022-05-09: qty 2

## 2022-05-09 MED ORDER — SODIUM CHLORIDE 0.9 % IV SOLN
1.0000 g | Freq: Once | INTRAVENOUS | Status: DC
  Filled 2022-05-09: qty 10

## 2022-05-09 MED ORDER — LIDOCAINE HCL (PF) 1 % IJ SOLN
1.0000 mL | Freq: Once | INTRAMUSCULAR | Status: AC
  Administered 2022-05-09: 1 mL
  Filled 2022-05-09: qty 5

## 2022-05-09 MED ORDER — CEFTRIAXONE SODIUM 500 MG IJ SOLR
500.0000 mg | Freq: Once | INTRAMUSCULAR | Status: AC
  Administered 2022-05-09: 500 mg via INTRAMUSCULAR
  Filled 2022-05-09: qty 500
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
## 2022-05-09 NOTE — Discharge Instructions (Addendum)
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 thrown out in the garbage or flushed down the toilet. If you are not sure, ask your care team. If it is safe to put it in the trash, take the medication out of the container. Mix the medication with cat litter, dirt, coffee grounds, or other unwanted substance. Seal the mixture in a bag or container. Put it in the trash. NOTE: This sheet is a summary. It may not cover all possible information. If you have questions about this  medicine, talk to your doctor, pharmacist, or health care provider.  2022 Elsevier/Gold Standard (2020-02-25 13:29:17)  Ulipristal Tablets  What is this medication? ULIPRISTAL (UE li pris tal) can prevent pregnancy. It should be taken as soon as possible in the 5 days (120 hours) after unprotected sex or if you think your contraceptive didn't work. It belongs to a group of medications called emergency contraceptives. It does not prevent HIV or other sexually transmitted infections (STIs). This medicine may be used for  other purposes; ask your health care provider or pharmacist if you have questions. COMMON BRAND NAME(S): ella What should I tell my care team before I take this medication? They need to know if you have any of these conditions: Liver disease An unusual or allergic reaction to ulipristal, other medications, foods, dyes, or preservatives Pregnant or trying to get pregnant Breast-feeding How should I use this medication? Take this medication by mouth with or without food. Your care team may want you to use a quick-response pregnancy test prior to using the tablets. Take your medication as soon as possible and not more than 5 days (120 hours) after the event. This medication can be taken at any time during your menstrual cycle. Follow the dose instructions of your care team exactly. Contact your care team right away if you vomit within 3 hours of taking your medication to discuss if you need to take another tablet. A patient package insert for the product will be given with each prescription and refill. Read this sheet carefully each time. The sheet may change frequently. Contact your care team about the use of this medication in children. Special care may be needed. Overdosage: If you think you have taken too much of this medicine contact a poison control center or emergency room at once. NOTE: This medicine is only for you. Do not share this medicine with others. What if I miss a dose? This medication is not for regular use. If you vomit within 3 hours of taking your dose, contact your care team for instructions. What may interact with this medication? This medication may interact with the following: Barbiturates such as phenobarbital or primidone Birth control pills Bosentan Carbamazepine Certain medications for fungal infections like griseofulvin, itraconazole, and ketoconazole Certain medications for HIV or AIDS or  hepatitis Dabigatran Digoxin Felbamate Fexofenadine Oxcarbazepine Phenytoin Rifampin St. John's Wort Topiramate This list may not describe all possible interactions. Give your health care provider a list of all the medicines, herbs, non-prescription drugs, or dietary supplements you use. Also tell them if you smoke, drink alcohol, or use illegal drugs. Some items may interact with your medicine. What should I watch for while using this medication? Your period may begin a few days earlier or later than expected. If your period is more than 7 days late, pregnancy is possible. See your care team as soon as you can and get a pregnancy test. Talk to your care team before taking this medication if you know or suspect that you are pregnant. Contact your care team if you think you may be pregnant and you have taken this medication. If you have severe abdominal pain about 3 to 5 weeks after taking this medication, you may have a pregnancy outside the womb, which is called an ectopic or tubal pregnancy. Call your care team or go to the nearest emergency room right away if you think this is happening. Discuss birth control options with your care team. Emergency birth control is not to be used routinely  to prevent pregnancy. It should not be used more than once in the same cycle. Birth control pills may not work properly while you are taking this medication. Wait at least 5 days after taking this medication to start or continue other hormone based birth control. Be sure to use a reliable barrier contraceptive method (such as a condom with spermicide) between the time you take this medication and your next period. This medication does not protect you against HIV infection (AIDS) or any other sexually transmitted diseases (STDs). What side effects may I notice from receiving this medication? Side effects that you should report to your care team as soon as possible: Allergic reactions-skin rash, itching, hives,  swelling of the face, lips, tongue, or throat Side effects that usually do not require medical attention (report to your care team if they continue or are bothersome): Dizziness Fatigue Headache Irregular menstrual cycles or spotting Menstrual cramps Nausea Stomach pain This list may not describe all possible side effects. Call your doctor for medical advice about side effects. You may report side effects to FDA at 1-800-FDA-1088.
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
## 2022-05-09 NOTE — ED Notes (Signed)
IM rocephin given to pt at this time.  SANE RN in room at this time as chaperone while meds being administered.
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
## 2022-05-10 ENCOUNTER — Other Ambulatory Visit: Payer: Self-pay

## 2022-05-10 ENCOUNTER — Emergency Department
Admission: EM | Admit: 2022-05-10 | Discharge: 2022-05-10 | Disposition: A | Discharge status: Home / Self Care | Payer: No Typology Code available for payment source | Attending: Emergency Medicine | Admitting: Emergency Medicine

## 2022-05-10 ENCOUNTER — Encounter: Payer: Self-pay | Admitting: Emergency Medicine

## 2022-05-10 DIAGNOSIS — R45851 Suicidal ideations: Secondary | ICD-10-CM | POA: Insufficient documentation

## 2022-05-10 DIAGNOSIS — Z139 Encounter for screening, unspecified: Secondary | ICD-10-CM

## 2022-05-10 DIAGNOSIS — F250 Schizoaffective disorder, bipolar type: Secondary | ICD-10-CM

## 2022-05-10 DIAGNOSIS — F603 Borderline personality disorder: Secondary | ICD-10-CM | POA: Diagnosis present

## 2022-05-10 DIAGNOSIS — F411 Generalized anxiety disorder: Secondary | ICD-10-CM | POA: Diagnosis present

## 2022-05-10 DIAGNOSIS — I10 Essential (primary) hypertension: Secondary | ICD-10-CM | POA: Insufficient documentation

## 2022-05-10 DIAGNOSIS — Z8659 Personal history of other mental and behavioral disorders: Secondary | ICD-10-CM

## 2022-05-10 LAB — URINE DRUG SCREEN, QUALITATIVE (ARMC ONLY)
Amphetamines, Ur Screen: NOT DETECTED
Barbiturates, Ur Screen: NOT DETECTED
Benzodiazepine, Ur Scrn: NOT DETECTED
Cannabinoid 50 Ng, Ur ~~LOC~~: NOT DETECTED
Cocaine Metabolite,Ur ~~LOC~~: NOT DETECTED
MDMA (Ecstasy)Ur Screen: NOT DETECTED
Methadone Scn, Ur: NOT DETECTED
Opiate, Ur Screen: NOT DETECTED
Phencyclidine (PCP) Ur S: NOT DETECTED
Tricyclic, Ur Screen: NOT DETECTED

## 2022-05-10 LAB — URINALYSIS, ROUTINE W REFLEX MICROSCOPIC
Bilirubin Urine: NEGATIVE
Glucose, UA: NEGATIVE mg/dL
Ketones, ur: NEGATIVE mg/dL
Nitrite: NEGATIVE
Protein, ur: NEGATIVE mg/dL
Specific Gravity, Urine: 1.026 (ref 1.005–1.030)
pH: 5 (ref 5.0–8.0)

## 2022-05-10 LAB — POC URINE PREG, ED: Preg Test, Ur: NEGATIVE

## 2022-05-10 NOTE — ED Notes (Signed)
Pts 3 belonging bags placed in designate pt belonging tote within the locked storage area in BHU for psych pt belongings. Tote labeled with pts name and date belongings were put in the locked unit.

## 2022-05-10 NOTE — ED Notes (Signed)
Pt taking shower. Pt was given hygiene items and the following, 1 clean top, 1 clean bottom, with 1 pair of disposable underwear.  Pt changed out into clean clothing.  Staff disposed of all shower supplies.  Pt changed sheets and given new and clean sheets along with pillow cases.

## 2022-05-10 NOTE — ED Notes (Signed)
Pt awake, calm & cooperative.

## 2022-05-10 NOTE — ED Provider Notes (Signed)
Vitals:   05/10/22 0502 05/10/22 0917  BP: (!) 137/100 (!) 148/116  Pulse: 88 92  Resp: 18 17  Temp: 98 F (36.7 C) 98.2 F (36.8 C)  SpO2: 99% 100%     Patient has been seen and evaluated by psychiatry, they advised recommend discharge with outpatient follow-up. Thurston Hole, NP has evaluated. Psych team feels high chance patient is malingering.    Mom recommended the patient follow-up with [facility], but she actually reported me that she is seen and followed by a different psychiatric team in [facility].  She advised they do have psychiatric services and counseling available to her.  She advises that she will reach out to them.  Return precautions and treatment recommendations and follow-up discussed with the patient who is agreeable with the plan.    Sharyn Creamer, MD 05/10/22 1401

## 2022-05-10 NOTE — ED Notes (Signed)
EDT Caitlin attempted blood draw--pt immed asks for needle to be removed and refuses blood draw at present
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
## 2022-05-10 NOTE — ED Notes (Signed)
Pt offered hospital phone to call ride. Pt declined.

## 2022-05-10 NOTE — ED Notes (Addendum)
Pt states she doesn't need to call for a ride home, she is going to take the bus. RN georgie aware

## 2022-05-10 NOTE — ED Notes (Addendum)
With this nurse and EDT Caitlin present, pt removes green coat, jeans, blue short-sleeve button up shirt, black socks, black slip on shoes, brown sports bra, cell phone, and grey backpack--all placed in labeled pt belonging bag to be secured on nursing unit and pt changed into behav scrubs

## 2022-05-10 NOTE — Discharge Instructions (Signed)
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
## 2022-05-10 NOTE — ED Notes (Signed)
Pt currently denies SI

## 2022-05-10 NOTE — ED Notes (Signed)
Counselor and psych personnel talking with pt.
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
## 2022-05-10 NOTE — ED Notes (Signed)
Pt up to restroom; steady.  ?

## 2022-05-10 NOTE — ED Notes (Signed)
Pt received breakfast tray and drink. Pt c/c.  
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
## 2022-05-10 NOTE — ED Notes (Signed)
Pt given belongings; pt changing into personal clothes.

## 2022-05-10 NOTE — ED Notes (Signed)
Pt snoring; chest rise and fall noted.

## 2022-05-10 NOTE — ED Notes (Signed)
Pt still in shower.

## 2022-05-10 NOTE — ED Notes (Signed)
EDT attempted to obtain labs. Once EDT inserted the needle, pt said "We are not going to do blood work." EDT took butterfly needle out of pts arm, wrapped pts arm with co band and gauze. Labs not obtained at this time.  Pt taken to bathroom and clean catch urine sample obtained. POC Preg performed and urine sent to lab. Pt provided with 2 warm blankets, walked to 19 Hall bed and instructed to have a seat. Pt laid down in bed and covered up with blanket. Pt voiced no further needs at this time.

## 2022-05-17 ENCOUNTER — Other Ambulatory Visit (HOSPITAL_COMMUNITY): Payer: Self-pay | Admitting: Neurology

## 2022-05-17 ENCOUNTER — Other Ambulatory Visit: Payer: Self-pay | Admitting: Neurology

## 2022-05-17 DIAGNOSIS — R569 Unspecified convulsions: Secondary | ICD-10-CM

## 2022-06-08 ENCOUNTER — Emergency Department
Admission: EM | Admit: 2022-06-08 | Discharge: 2022-06-08 | Disposition: A | Discharge status: Home / Self Care | Payer: Medicaid Other | Attending: Emergency Medicine | Admitting: Emergency Medicine

## 2022-06-08 ENCOUNTER — Other Ambulatory Visit: Payer: Self-pay

## 2022-06-08 DIAGNOSIS — I10 Essential (primary) hypertension: Secondary | ICD-10-CM | POA: Diagnosis not present

## 2022-06-08 DIAGNOSIS — L309 Dermatitis, unspecified: Secondary | ICD-10-CM | POA: Diagnosis not present

## 2022-06-08 DIAGNOSIS — Z760 Encounter for issue of repeat prescription: Secondary | ICD-10-CM | POA: Diagnosis not present

## 2022-06-08 DIAGNOSIS — F40298 Other specified phobia: Secondary | ICD-10-CM

## 2022-06-08 DIAGNOSIS — L299 Pruritus, unspecified: Secondary | ICD-10-CM | POA: Diagnosis present

## 2022-06-08 DIAGNOSIS — Z711 Person with feared health complaint in whom no diagnosis is made: Secondary | ICD-10-CM | POA: Insufficient documentation

## 2022-06-08 MED ORDER — DEXAMETHASONE SODIUM PHOSPHATE 10 MG/ML IJ SOLN
10.0000 mg | Freq: Once | INTRAMUSCULAR | Status: AC
  Administered 2022-06-08: 10 mg via INTRAMUSCULAR
  Filled 2022-06-08: qty 1

## 2022-06-08 MED ORDER — FAMOTIDINE 20 MG PO TABS: 20.0000 mg | ORAL_TABLET | Freq: Two times a day (BID) | ORAL | 0 refills | Status: AC

## 2022-06-08 MED ORDER — CETIRIZINE HCL 10 MG PO TABS: 10.0000 mg | ORAL_TABLET | Freq: Every day | ORAL | 0 refills | Status: AC

## 2022-06-08 NOTE — ED Triage Notes (Addendum)
Pt to ED from home for "hyperthermia shot". Pt states I also need my insulin medication as well.  Pt is CAOx4, in no acute distress and ambulatory in triage.  Pt denies any SI/HI thoughts today. Pt is not hearing things or seeing things at this time and is calm and cooperative.

## 2022-06-08 NOTE — Discharge Instructions (Signed)
Your exam is reassuring at this time.  You should follow-up with your primary provider for ongoing evaluation and management.  Take the prescription allergy medicine and antihistamine (famotidine) for ongoing itch relief.

## 2022-06-08 NOTE — ED Provider Notes (Signed)
[facility] Emergency Department Provider Note     Event Date/Time   First MD Initiated Contact with Patient 06/08/22 1711     (approximate)   History   Medication Refill (See triage notes)   HPI  Barbara Benton is a 27 y.o. female with a history of HTN, bipolar disorder, ODD, ADHD, depression and anxiety presents to the ED with multiple complaints.  Patient initially presented with request for med refill.  Patient was requesting insulin refill as well as metformin.  There is no chart record of diabetes mellitus being a part of her past medical history.  Chart review also reveals normal serum glucose levels dating back as far as 2019.  Patient also requesting treatment for hyperthermia.  She reports she was formally on home, and slept outside during the winter months.  She describes intermittent chills when she takes off her jacket currently.  Patient denies any exposure to cold at this time.  Patient's other complaint is chronic dermatitis, she reports ongoing itching to her arms and abdomen.  She is been on a course of prescription steroid cream that she uses for her face primarily patient denies any treatment at this manage she also admits that he is not currently taking previously prescribed cetirizine.  No fevers, chills, chest pain, shortness of breath reported.  Patient also denies any SI, HI, or auditory/visual hallucinations at this time.   Physical Exam   Triage Vital Signs: ED Triage Vitals [06/08/22 1614]  Enc Vitals Group     BP (!) 135/93     Pulse Rate 86     Resp 16     Temp 98.2 F (36.8 C)     Temp Source Oral     SpO2 100 %     Weight 238 lb 1.6 oz (108 kg)     Height 5' (1.524 m)     Head Circumference      Peak Flow      Pain Score 0     Pain Loc      Pain Edu?      Excl. in GC?     Most recent vital signs: Vitals:   06/08/22 1614  BP: (!) 135/93  Pulse: 86  Resp: 16  Temp: 98.2 F (36.8 C)  SpO2: 100%     General Awake, no distress. NAD HEENT NCAT. PERRL. EOMI. No rhinorrhea. Mucous membranes are moist.  CV:  Good peripheral perfusion. RRR RESP:  Normal effort. CTA ABD:  No distention. Soft, nontender  ED Results / Procedures / Treatments   Labs (all labs ordered are listed, but only abnormal results are displayed) Labs Reviewed - No data to display   EKG   RADIOLOGY   No results found.   PROCEDURES:  Critical Care performed: No  Procedures   MEDICATIONS ORDERED IN ED: Medications  dexamethasone (DECADRON) injection 10 mg (10 mg Intramuscular Given 06/08/22 1757)     IMPRESSION / MDM / ASSESSMENT AND PLAN / ED COURSE  I reviewed the triage vital signs and the nursing notes.                              Differential diagnosis includes, but is not limited to, contact dermatitis, eczema exacerbation, anxiety about a condition without a diagnosis.  Patient's presentation is most consistent with acute complicated illness / injury requiring diagnostic workup.  Patient's diagnosis is consistent with dermatitis. Patient will be discharged  home with prescriptions for famotidine and cetirizine.  No evidence of a history of diabetes in the patient's chart, therefore no prescription for diabetes were provided at this time.  Patient is to follow up with her PCP as needed or otherwise directed. Patient is given ED precautions to return to the ED for any worsening or new symptoms.  FINAL CLINICAL IMPRESSION(S) / ED DIAGNOSES   Final diagnoses:  Fear of developing endocrine disorder  Dermatitis     Rx / DC Orders   ED Discharge Orders          Ordered    cetirizine (ZYRTEC) 10 MG tablet  Daily        06/08/22 1756    famotidine (PEPCID) 20 MG tablet  2 times daily        06/08/22 1756             Note:  This document was prepared using Dragon voice recognition software and may include unintentional dictation errors.    Lissa Hoard,  PA-C 06/08/22 1818    Chesley Noon, MD 06/08/22 Windell Moment

## 2022-06-20 ENCOUNTER — Emergency Department
Admission: EM | Admit: 2022-06-20 | Discharge: 2022-06-21 | Disposition: A | Discharge status: Home / Self Care | Payer: No Typology Code available for payment source | Attending: Emergency Medicine | Admitting: Emergency Medicine

## 2022-06-20 ENCOUNTER — Other Ambulatory Visit: Payer: Self-pay

## 2022-06-20 ENCOUNTER — Emergency Department: Payer: No Typology Code available for payment source

## 2022-06-20 DIAGNOSIS — F319 Bipolar disorder, unspecified: Secondary | ICD-10-CM | POA: Insufficient documentation

## 2022-06-20 DIAGNOSIS — F909 Attention-deficit hyperactivity disorder, unspecified type: Secondary | ICD-10-CM | POA: Diagnosis not present

## 2022-06-20 DIAGNOSIS — I10 Essential (primary) hypertension: Secondary | ICD-10-CM | POA: Diagnosis not present

## 2022-06-20 DIAGNOSIS — F419 Anxiety disorder, unspecified: Secondary | ICD-10-CM | POA: Diagnosis not present

## 2022-06-20 DIAGNOSIS — R4182 Altered mental status, unspecified: Secondary | ICD-10-CM | POA: Diagnosis not present

## 2022-06-20 DIAGNOSIS — F913 Oppositional defiant disorder: Secondary | ICD-10-CM | POA: Insufficient documentation

## 2022-06-20 DIAGNOSIS — R45851 Suicidal ideations: Secondary | ICD-10-CM | POA: Diagnosis present

## 2022-06-20 DIAGNOSIS — F32A Depression, unspecified: Secondary | ICD-10-CM | POA: Diagnosis not present

## 2022-06-20 DIAGNOSIS — F17210 Nicotine dependence, cigarettes, uncomplicated: Secondary | ICD-10-CM | POA: Diagnosis not present

## 2022-06-20 LAB — COMPREHENSIVE METABOLIC PANEL
ALT: 30 U/L (ref 0–44)
AST: 32 U/L (ref 15–41)
Albumin: 4 g/dL (ref 3.5–5.0)
Alkaline Phosphatase: 88 U/L (ref 38–126)
Anion gap: 9 (ref 5–15)
BUN: 8 mg/dL (ref 6–20)
CO2: 24 mmol/L (ref 22–32)
Calcium: 9.4 mg/dL (ref 8.9–10.3)
Chloride: 106 mmol/L (ref 98–111)
Creatinine, Ser: 0.77 mg/dL (ref 0.44–1.00)
GFR, Estimated: >60 mL/min (ref >60)
Glucose, Bld: 114 mg/dL — ABNORMAL HIGH (ref 70–99)
Potassium: 3.3 mmol/L — ABNORMAL LOW (ref 3.5–5.1)
Sodium: 139 mmol/L (ref 135–145)
Total Bilirubin: 0.5 mg/dL (ref 0.3–1.2)
Total Protein: 7.9 g/dL (ref 6.5–8.1)

## 2022-06-20 LAB — URINE DRUG SCREEN, QUALITATIVE (ARMC ONLY)
Amphetamines, Ur Screen: NOT DETECTED
Barbiturates, Ur Screen: NOT DETECTED
Benzodiazepine, Ur Scrn: NOT DETECTED
Cannabinoid 50 Ng, Ur ~~LOC~~: NOT DETECTED
Cocaine Metabolite,Ur ~~LOC~~: NOT DETECTED
MDMA (Ecstasy)Ur Screen: NOT DETECTED
Methadone Scn, Ur: NOT DETECTED
Opiate, Ur Screen: NOT DETECTED
Phencyclidine (PCP) Ur S: NOT DETECTED
Tricyclic, Ur Screen: NOT DETECTED

## 2022-06-20 LAB — CBC
HCT: 37.9 % (ref 36.0–46.0)
Hemoglobin: 11.9 g/dL — ABNORMAL LOW (ref 12.0–15.0)
MCH: 26.3 pg (ref 26.0–34.0)
MCHC: 31.4 g/dL (ref 30.0–36.0)
MCV: 83.7 fL (ref 80.0–100.0)
Platelets: 398 10*3/uL (ref 150–400)
RBC: 4.53 MIL/uL (ref 3.87–5.11)
RDW: 14.6 % (ref 11.5–15.5)
WBC: 8.7 10*3/uL (ref 4.0–10.5)
nRBC: 0 % (ref 0.0–0.2)

## 2022-06-20 LAB — POC URINE PREG, ED: Preg Test, Ur: NEGATIVE

## 2022-06-20 LAB — SALICYLATE LEVEL: Salicylate Lvl: <7 mg/dL — ABNORMAL LOW (ref 7.0–30.0)

## 2022-06-20 LAB — ETHANOL: Alcohol, Ethyl (B): <10 mg/dL (ref <10)

## 2022-06-20 LAB — ACETAMINOPHEN LEVEL: Acetaminophen (Tylenol), Serum: <10 ug/mL — ABNORMAL LOW (ref 10–30)

## 2022-06-20 MED ORDER — LORAZEPAM 1 MG PO TABS
1.0000 mg | ORAL_TABLET | Freq: Once | ORAL | Status: AC
  Administered 2022-06-20: 1 mg via ORAL
  Filled 2022-06-20: qty 1

## 2022-06-20 NOTE — BH Assessment (Signed)
Comprehensive Clinical Assessment (CCA) Screening, Triage and Referral Note  06/20/2022 Barbara Benton [id number] Recommendations for Services/Supports/Treatments: Consulted with Barbara Fuel., NP, who recommended pt. be observed overnight and reassessed in the morning.  Barbara Benton is a 27 year old, English speaking, Black female with a hx of borderline personality disorder, PTSD, GAD, and Schizoaffective disorder, bipolar type. Pt presented to [facility] ED voluntarily complaining that she is unable to breathe. Per triage note: Pt bib EMS- crying but did not tell them what was wrong. Pt crying in triage. Pt staring off saying she can't breathe. Pt not answering questions during triage. Pt continues to cry. Pt states "I [place] out [place] the streets and I can't breathe"  On assessment, pt. was visibly in distress and standing in the doorway. Pt was restless and her knee was shaking upon sitting down on her bed. Pt repeated that she could not breathe and began crying, multiple times throughout the assessment. Pt explained that she came to the hospital because she knew she needed oxygen. Pt was able to express that she was having difficulty getting her thoughts together. Pt reported that she is homeless. Pt was unable to respond to questions appropriately, and appeared to have thought blocking. Pt did not make sense when responding to assessment questions. The pt. had no insight and had poor judgement. Pt appeared to be responding to internal stimuli. Pt presented with blocked speech and had poor concentration. Pt presented with an anxious mood; affect was congruent. Pt's BAL/UDS was irrelevant. Pt unable to confirm or deny SI/HI/AV/H. Chief Complaint: No chief complaint on file.  Visit Diagnosis: Schizoaffective disorder, bipolar type (HCC) Active Problems:   Borderline personality disorder (HCC)   History of posttraumatic stress disorder (PTSD)   GAD (generalized anxiety disorder)   Suicidal ideation   Patient Reported  Information How did you hear about [place]? Self  What Is the Reason for Your Visit/Call Today? Pt bib EMS- crying but did not tell them what was wrong. Pt crying in triage. Pt staring off saying she can't breath. Pt not answering questions during triage. Pt continues to cry.Pt states "I live out in the streets and I can't breathe"  How Long Has This Been Causing You Problems? > than 6 months  What Do You Feel Would Help You the Most Today? Stress Management; Treatment for Depression or other mood problem   Have You Recently Had Any Thoughts About Hurting Yourself? Yes  Are You Planning to Commit Suicide/Harm Yourself At This time? No   Have you Recently Had Thoughts About Hurting Someone Barbara Benton? No  Are You Planning to Harm Someone at This Time? No  Explanation: n/a   Have You Used Any Alcohol or Drugs in the Past 24 Hours? No  How Long Ago Did You Use Drugs or Alcohol? No data recorded What Did You Use and How Much? n/a   Do You Currently Have a Therapist/Psychiatrist? Yes  Name of Therapist/Psychiatrist: Pt reported that she has a therapy appointment with Barbara Benton   Have You Been Recently Discharged From Any Office Practice or Programs? No  Explanation of Discharge From Practice/Program: Pt recently discharged from [facility] 2 weeks ago    CCA Screening Triage Referral Assessment Type of Contact: Face-to-Face  Telemedicine Service Delivery:   Is this Initial or Reassessment?   Date Telepsych consult ordered in [facility]:    Time Telepsych consult ordered in [facility]:    Location of Assessment: [facility] ED  Provider Location: [facility] ED    Collateral  Involvement: None provided   Does Patient Have a Court Appointed Legal Guardian? No data recorded Name and Contact of Legal Guardian: No data recorded If Minor and Not Living with Parent(s), Who has Custody? n/a  Is CPS involved or ever been involved? Never  Is APS involved or ever been involved? Never   Patient Determined  To Be At Risk for Harm To Self or Others Based on Review of Patient Reported Information or Presenting Complaint? No  Method: No Plan  Availability of Means: No access or NA  Intent: Vague intent or NA  Notification Required: No need or identified person  Additional Information for Danger to Others Potential: -- (n/a)  Additional Comments for Danger to Others Potential: n/a  Are There Guns or Other Weapons in Your Home? No  Types of Guns/Weapons: n/a  Are These Weapons Safely Secured?                            -- (n/a)  Who Could Verify You Are Able To Have These Secured: N/A  Do You Have any Outstanding Charges, Pending Court Dates, Parole/Probation? n/a  Contacted To Inform of Risk of Harm To Self or Others: Other: Comment (n/a)   Does Patient Present under Involuntary Commitment? No    [place] of Residence: [place]   Patient Currently Receiving the Following Services: -- (UTA)   Determination of Need: Emergent (2 hours)   Options For Referral: ED Visit   Discharge Disposition:     Jayro Mcmath R Merikay Lesniewski, LCAS

## 2022-06-20 NOTE — Consult Note (Signed)
Telepsych Consultation   Reason for Consult:  increase  Referring Physician:  Trinna Post MD Location of Patient:  Location of Provider: Other: [facility]  Patient Identification: Barbara Benton MRN:  [id number] Principal Diagnosis: <principal problem not specified> Diagnosis:  Active Problems:   * No active hospital problems. *   Total Time spent with patient: 20 minutes  Subjective:   Barbara Benton is a 27 y.o. female, with a history of bipolar disorder, suicide ideation, anxiety.  Patient was assessed via telemedicine.  Patient is observed in the room sitting down and having her supper.  Patient is alert and oriented to person and place.  However patient does seem a little bit delay cognitively, patient was not able to answer questions appropriately at this time.  When asked questions patient stated " I am scared of being able unable to breathe".  Patient could not articulate what did she mean by her statement.  Patient unable to stay focused on the question when asked. Writer had to repeated 2 or 3 times, each question before patient would answer.  Writer was unable to get enough information out of patient because of her cognitive delay and not able to answer questions appropriately.  When asked questions such as if she was suicidal patient stated no, when asked if she smoke patient took a while before she could answer the question.  Pt would at time refer or answer in the third person. Patient does seem to have some word findings issue.  Pt did state she is homeless and according to her she has been homeless for the past 10 year.  Its on clear if patient was been truthful.     Recommend inpatient observation and re-assessment in the AM when patient is more cognitive     HPI:  copied from TTS notes: This pt came in voluntarily with complaints of not being able to breathe and would not respond when triage nurse asked about SI. On assessment, pt was visibly in distress and pacing. Pt was nonsensical  and her answers were irrelevant. Pt continued to complain of needing oxygen and reported that her thoughts were racing. Pt had thought blocking. Pt unable to confirm or deny SI/HI/AV/H   Past Psychiatric History: Bipolar, SI, Anxiety,    Risk to Self: no  Risk to Others:  no Prior Inpatient Therapy:   Prior Outpatient Therapy:    Past Medical History:  Past Medical History:  Diagnosis Date   ADHD (attention deficit hyperactivity disorder)    Anxiety    Bipolar disorder (HCC)    Depression    GERD (gastroesophageal reflux disease)    Hypertension    ODD (oppositional defiant disorder)     Past Surgical History:  Procedure Laterality Date   NO PAST SURGERIES     Family History:  Family History  Problem Relation Age of Onset   Diabetes Maternal Grandmother    Cancer Paternal Grandfather        type unknown   Family Psychiatric  History: unknown Social History:  Social History   Substance and Sexual Activity  Alcohol Use Yes   Comment: occ     Social History   Substance and Sexual Activity  Drug Use Not Currently   Types: Marijuana, MDMA Chiropodist)    Social History   Socioeconomic History   Marital status: Single    Spouse name: Not on file   Number of children: 0   Years of education: Not on file   Highest education level: Not  on file  Occupational History   Occupation: Advertising account planner  Tobacco Use   Smoking status: Every Day    Packs/day: .5    Types: Cigarettes   Smokeless tobacco: Never  Vaping Use   Vaping Use: Some days  Substance and Sexual Activity   Alcohol use: Yes    Comment: occ   Drug use: Not Currently    Types: Marijuana, MDMA (Ecstacy)   Sexual activity: Yes    Birth control/protection: Condom  Other Topics Concern   Not on file  Social History Narrative   Client lives alone; stated that she is on disability but getting a job.  Stated that she does not have a Therapist, sports.   Social Determinants of Health   Financial Resource  Strain: Not on file  Food Insecurity: Not on file  Transportation Needs: Not on file  Physical Activity: Not on file  Stress: Not on file  Social Connections: Not on file   Additional Social History:    Allergies:  No Known Allergies  Labs:  Results for orders placed or performed during the hospital encounter of 06/20/22 (from the past 48 hour(s))  Comprehensive metabolic panel     Status: Abnormal   Collection Time: 06/20/22  5:10 PM  Result Value Ref Range   Sodium 139 135 - 145 mmol/L   Potassium 3.3 (L) 3.5 - 5.1 mmol/L   Chloride 106 98 - 111 mmol/L   CO2 24 22 - 32 mmol/L   Glucose, Bld 114 (H) 70 - 99 mg/dL    Comment: Glucose reference range applies only to samples taken after fasting for at least 8 hours.   BUN 8 6 - 20 mg/dL   Creatinine, Ser 1.61 0.44 - 1.00 mg/dL   Calcium 9.4 8.9 - 09.6 mg/dL   Total Protein 7.9 6.5 - 8.1 g/dL   Albumin 4.0 3.5 - 5.0 g/dL   AST 32 15 - 41 U/L   ALT 30 0 - 44 U/L   Alkaline Phosphatase 88 38 - 126 U/L   Total Bilirubin 0.5 0.3 - 1.2 mg/dL   GFR, Estimated >04 >54 mL/min    Comment: (NOTE) Calculated using the CKD-EPI Creatinine Equation (2021)    Anion gap 9 5 - 15    Comment: Performed at [facility], [address] [place], [place] [address]  Ethanol     Status: None   Collection Time: 06/20/22  5:10 PM  Result Value Ref Range   Alcohol, Ethyl (B) <10 <10 mg/dL    Comment: (NOTE) Lowest detectable limit for serum alcohol is 10 mg/dL.  For medical purposes only. Performed at [facility], [address] [place], [place] [address]   Salicylate level     Status: Abnormal   Collection Time: 06/20/22  5:10 PM  Result Value Ref Range   Salicylate Lvl <7.0 (L) 7.0 - 30.0 mg/dL    Comment: Performed at [facility], [address] [place], [place] [address]  Acetaminophen level     Status: Abnormal   Collection Time: 06/20/22  5:10 PM  Result Value Ref Range   Acetaminophen (Tylenol),  Serum <10 (L) 10 - 30 ug/mL    Comment: (NOTE) Therapeutic concentrations vary significantly. A range of 10-30 ug/mL  may be an effective concentration for many patients. However, some  are best treated at concentrations outside of this range. Acetaminophen concentrations >150 ug/mL at 4 hours after ingestion  and >50 ug/mL at 12 hours after ingestion are often associated  with  toxic reactions.  Performed at [facility], [address] [place], [place] [address]   cbc     Status: Abnormal   Collection Time: 06/20/22  5:10 PM  Result Value Ref Range   WBC 8.7 4.0 - 10.5 K/uL   RBC 4.53 3.87 - 5.11 MIL/uL   Hemoglobin 11.9 (L) 12.0 - 15.0 g/dL   HCT 56.3 87.5 - 64.3 %   MCV 83.7 80.0 - 100.0 fL   MCH 26.3 26.0 - 34.0 pg   MCHC 31.4 30.0 - 36.0 g/dL   RDW 32.9 51.8 - 84.1 %   Platelets 398 150 - 400 K/uL   nRBC 0.0 0.0 - 0.2 %    Comment: Performed at [facility], [address] , [place] [address]  Urine Drug Screen, Qualitative     Status: None   Collection Time: 06/20/22  5:12 PM  Result Value Ref Range   Tricyclic, Ur Screen NONE DETECTED NONE DETECTED   Amphetamines, Ur Screen NONE DETECTED NONE DETECTED   MDMA (Ecstasy)Ur Screen NONE DETECTED NONE DETECTED   Cocaine Metabolite,Ur [place] NONE DETECTED NONE DETECTED   Opiate, Ur Screen NONE DETECTED NONE DETECTED   Phencyclidine (PCP) Ur S NONE DETECTED NONE DETECTED   Cannabinoid 50 Ng, Ur [place] NONE DETECTED NONE DETECTED   Barbiturates, Ur Screen NONE DETECTED NONE DETECTED   Benzodiazepine, Ur Scrn NONE DETECTED NONE DETECTED   Methadone Scn, Ur NONE DETECTED NONE DETECTED    Comment: (NOTE) Tricyclics + metabolites, urine    Cutoff 1000 ng/mL Amphetamines + metabolites, urine  Cutoff 1000 ng/mL MDMA (Ecstasy), urine              Cutoff 500 ng/mL Cocaine Metabolite, urine          Cutoff 300 ng/mL Opiate + metabolites, urine        Cutoff 300 ng/mL Phencyclidine (PCP), urine         Cutoff 25  ng/mL Cannabinoid, urine                 Cutoff 50 ng/mL Barbiturates + metabolites, urine  Cutoff 200 ng/mL Benzodiazepine, urine              Cutoff 200 ng/mL Methadone, urine                   Cutoff 300 ng/mL  The urine drug screen provides only a preliminary, unconfirmed analytical test result and should not be used for non-medical purposes. Clinical consideration and professional judgment should be applied to any positive drug screen result due to possible interfering substances. A more specific alternate chemical method must be used in order to obtain a confirmed analytical result. Gas chromatography / mass spectrometry (GC/MS) is the preferred confirm atory method. Performed at [facility], [address] [place], [place] [address]   POC urine preg, ED     Status: None   Collection Time: 06/20/22  5:24 PM  Result Value Ref Range   Preg Test, Ur Negative Negative    Medications:  No current facility-administered medications for this encounter.   Current Outpatient Medications  Medication Sig Dispense Refill   cetirizine (ZYRTEC) 10 MG tablet Take 1 tablet (10 mg total) by mouth daily. 30 tablet 0   clobetasol cream (TEMOVATE) 0.05 % Apply 1 Application topically 2 (two) times daily.     famotidine (PEPCID) 20 MG tablet Take 1 tablet (20 mg total) by mouth 2 (two) times daily for  20 days. 40 tablet 0   ARAZLO 0.045 % LOTN Apply 1 Application topically in the morning. (Patient not taking: Reported on 05/10/2022)     atomoxetine (STRATTERA) 18 MG capsule Take 18 mg by mouth daily. (Patient not taking: Reported on 03/16/2022)     Azelaic Acid 15 % gel Apply 1 Application topically every morning. (Patient not taking: Reported on 05/10/2022)     Dyclonine-Glycerin (CEPACOL SORE THROAT SPRAY) 0.1-33 % LIQD Use as directed 1 spray in the mouth or throat 2 (two) times daily as needed (sore throat). (Patient not taking: Reported on 06/20/2022) 118 mL 0   fluticasone (FLONASE) 50  MCG/ACT nasal spray Place 1 spray into both nostrils daily. (Patient not taking: Reported on 05/10/2022)     guanFACINE (INTUNIV) 1 MG TB24 ER tablet Take 1 mg by mouth daily. (Patient not taking: Reported on 05/10/2022)     lamoTRIgine (LAMICTAL) 100 MG tablet Take 100 mg by mouth 2 (two) times daily. (Patient not taking: Reported on 05/10/2022)     loratadine (CLARITIN) 10 MG tablet Take 1 tablet (10 mg total) by mouth daily as needed for allergies. (Patient not taking: Reported on 05/10/2022) 30 tablet 6   Multiple Vitamin (MULTIVITAMIN) TABS Take 1 tablet by mouth daily. (Patient not taking: Reported on 05/10/2022)     nystatin (MYCOSTATIN) 100000 UNIT/ML suspension Take 5 mLs by mouth 4 (four) times daily. (Patient not taking: Reported on 05/10/2022)     omeprazole (PRILOSEC) 40 MG capsule Take 1 capsule (40 mg total) by mouth daily as needed (heartburn). (Patient not taking: Reported on 05/10/2022) 30 capsule 3   phentermine (ADIPEX-P) 37.5 MG tablet Take 37.5 mg by mouth daily. (Patient not taking: Reported on 05/10/2022)     sertraline (ZOLOFT) 50 MG tablet Take 50 mg by mouth daily. (Patient not taking: Reported on 05/10/2022)     triamcinolone cream (KENALOG) 0.1 % Apply 1 Application topically 4 (four) times daily. (Patient not taking: Reported on 05/10/2022) 30 g 0   VRAYLAR 4.5 MG CAPS Take 1 capsule by mouth daily. (Patient not taking: Reported on 06/20/2022)     XENICAL 120 MG capsule Take 120 mg by mouth 3 (three) times daily. (Patient not taking: Reported on 05/10/2022)      Musculoskeletal: Strength & Muscle Tone: within normal limits Gait & Station:  unable to assess Patient leans: N/A          Psychiatric Specialty Exam:  Presentation  General Appearance:  Appropriate for Environment  Eye Contact: Good  Speech: Clear and Coherent  Speech Volume: Normal  Handedness: Right   Mood and Affect  Mood: Euthymic  Affect: Congruent; Blunt   Thought Process   Thought Processes: Coherent  Descriptions of Associations:Intact  Orientation:Full (Time, Place and Person)  Thought Content:Logical; WDL  History of Schizophrenia/Schizoaffective disorder:Yes  Duration of Psychotic Symptoms:N/A  Hallucinations:No data recorded Ideas of Reference:None  Suicidal Thoughts:No data recorded Homicidal Thoughts:No data recorded  Sensorium  Memory: Immediate Good; Recent Good  Judgment: Fair  Insight: Fair   Art therapist  Concentration: Fair  Attention Span: Fair  Recall: Good  Fund of Knowledge: Good  Language: Good   Psychomotor Activity  Psychomotor Activity:No data recorded  Assets  Assets: Desire for Improvement; Physical Health; Resilience   Sleep  Sleep:No data recorded   Physical Exam: Physical Exam HENT:     Head: Normocephalic.  Neurological:     Mental Status: She is alert.    Review of Systems  Psychiatric/Behavioral:  Positive for depression. The patient is nervous/anxious.    Blood pressure (!) 160/81, pulse (!) 102, temperature 98.9 F (37.2 C), temperature source Oral, resp. rate 20, weight 108 kg, last menstrual period 06/06/2022, SpO2 100 %. Body mass index is 46.5 kg/m.  Treatment Plan Summary: Daily contact with patient to assess and evaluate symptoms and progress in treatment  Disposition: Recommend psychiatric Inpatient admission when medically cleared.  This service was provided via telemedicine using a 2-way, interactive audio and video technology.  Names of all persons participating in this telemedicine service and their role in this encounter. Name: Sindy Guadeloupe Role: NP  Name: [name] Role: Patient  Name:  Role:   Name:  Role:     Sindy Guadeloupe, NP 06/20/2022 10:14 PM

## 2022-06-20 NOTE — ED Notes (Signed)
VOL  PENDING  PLACEMENT 

## 2022-06-20 NOTE — ED Notes (Signed)
Patient continues to come to room door stating she can't breath. O2 normal at 100%.  Patient redirected multiple of times to stay in room. TTS currently talking to patient.

## 2022-06-20 NOTE — ED Notes (Signed)
Pt states "I am very suicidal" when asked if she had a plan pt just starred at nurse and said no. Pt continues to cry.

## 2022-06-20 NOTE — ED Notes (Signed)
Tele psych computer in the room.

## 2022-06-20 NOTE — ED Notes (Signed)
X-ray called at this time and informed that pt reported she thinks she can lay still enough for xray at this time. Reports they will be here shortly to complete xray

## 2022-06-20 NOTE — ED Triage Notes (Signed)
Pt bib EMS- crying but did not tell them what was wrong. Pt crying in triage. Pt staring off saying she can't breath. Pt not answering questions during triage. Pt continues to cry.   Pt states "[place] and I can't breathe"

## 2022-06-20 NOTE — ED Provider Notes (Signed)
[facility] Provider Note    Event Date/Time   First MD Initiated Contact with Patient 06/20/22 1814     (approximate)   History   No chief complaint on file.   HPI  Barbara Benton is a 27 y.o. female with history of anxiety, bipolar disorder, ODD, HTN presenting to the emergency department for evaluation of suicidal ideation.  Patient initially tearful, was not stating why she was in the ER in triage.  On my evaluation, she reports "I cannot breathe", but later tells me "I want to kill myself".  She denies having a specific plan.  Denies HI.  Denies AVH.  No reported fever, cough.      Physical Exam   Triage Vital Signs: ED Triage Vitals  Enc Vitals Group     BP 06/20/22 1706 (!) 171/103     Pulse Rate 06/20/22 1706 (!) 101     Resp 06/20/22 1706 18     Temp 06/20/22 1706 98.4 F (36.9 C)     Temp Source 06/20/22 1706 Oral     SpO2 06/20/22 1706 100 %     Weight 06/20/22 1707 238 lb 1.6 oz (108 kg)     Height --      Head Circumference --      Peak Flow --      Pain Score 06/20/22 1707 0     Pain Loc --      Pain Edu? --      Excl. in [facility]? --     Most recent vital signs: Vitals:   06/20/22 1954 06/21/22 0828  BP: (!) 160/81 126/82  Pulse: (!) 102 (!) 103  Resp: 20 17  Temp: 98.9 F (37.2 C) 98 F (36.7 C)  SpO2: 100% 98%     General: Awake, interactive, tearful, occasionally yelling, requiring frequent prompting to continue conversation CV:  Mild tachycardia regular rhythm Resp:  Lungs clear, tachypneic in the setting of crying, but respirations otherwise not significantly labored Abd:  Soft, nondistended.  Neuro:  Symmetric facial movement, fluid speech   ED Results / Procedures / Treatments   Labs (all labs ordered are listed, but only abnormal results are displayed) Labs Reviewed  COMPREHENSIVE METABOLIC PANEL - Abnormal; Notable for the following components:      Result Value   Potassium 3.3 (*)    Glucose, Bld 114 (*)     All other components within normal limits  SALICYLATE LEVEL - Abnormal; Notable for the following components:   Salicylate Lvl <7.0 (*)    All other components within normal limits  ACETAMINOPHEN LEVEL - Abnormal; Notable for the following components:   Acetaminophen (Tylenol), Serum <10 (*)    All other components within normal limits  CBC - Abnormal; Notable for the following components:   Hemoglobin 11.9 (*)    All other components within normal limits  ETHANOL  URINE DRUG SCREEN, QUALITATIVE ([facility] ONLY)  POC URINE PREG, ED     EKG EKG independently reviewed interpreted by myself (ER attending) demonstrates:    RADIOLOGY Imaging independently reviewed and interpreted by myself demonstrates:    PROCEDURES:  Critical Care performed: No  Procedures   MEDICATIONS ORDERED IN ED: Medications  LORazepam (ATIVAN) tablet 1 mg (1 mg Oral Given 06/20/22 1834)  LORazepam (ATIVAN) tablet 2 mg (2 mg Oral Given 06/21/22 0002)     IMPRESSION / MDM / ASSESSMENT AND PLAN / ED COURSE  I reviewed the triage vital signs and the nursing notes.  Differential diagnosis includes, but is not limited to, decompensated psychiatric illness, suicidal ideation  Patient's presentation is most consistent with acute presentation with potential threat to life or bodily function.  27 year old female presenting to the emergency department voluntarily with suicidal ideation without a plan.  She did additionally report difficulty breathing, so an x-Doreene Forrey was ordered which was without acute pathology.  Did later seem to have improvement in her breathing.  Lab work including CBC, CMP, coingestion levels negative.  Negative UDS and UPT.  Do think patient to be medically cleared for psychiatric evaluation.  The patient has been placed in psychiatric observation due to the need to provide a safe environment for the patient while obtaining psychiatric consultation and evaluation, as well as ongoing medical and  medication management to treat the patient's condition.  The patient has not been placed under full IVC at this time.  Notified by psychiatric NP Mayford Knife that patient with significantly slow responses during his evaluation, concerns for possible cognitive deficit.  I do not appreciate any focal deficits on my exam, but will obtain head CT to further evaluate.  If this is normal, do think patient can remain medically cleared for psychiatric evaluation, per initial exam likely appropriate for inpatient treatment.      FINAL CLINICAL IMPRESSION(S) / ED DIAGNOSES   Final diagnoses:  Suicidal ideation     Rx / DC Orders   ED Discharge Orders     None        Note:  This document was prepared using Dragon voice recognition software and may include unintentional dictation errors.   Trinna Post, MD 06/21/22 [phone]

## 2022-06-20 NOTE — ED Notes (Signed)
Pt belongings:  Black [employer]idas shoes Flower bra [employer] fitness shirt Blue jeggings $9 cash  Engineer, structural  Hair tie  Placed in 2 pt belonging bags.

## 2022-06-20 NOTE — ED Triage Notes (Signed)
First Nurse Note:  Pt via [facility] from [employer]. Pt is hysterically crying but pt is not saying anything to EMS. Pt has a hx Depression, Anxiety, and BiPolar Disorder.  154/100  127 HR Pt is A&Ox4 and NAD

## 2022-06-21 DIAGNOSIS — R45851 Suicidal ideations: Secondary | ICD-10-CM

## 2022-06-21 MED ORDER — LORAZEPAM 2 MG PO TABS
2.0000 mg | ORAL_TABLET | Freq: Once | ORAL | Status: AC
  Administered 2022-06-21: 2 mg via ORAL
  Filled 2022-06-21: qty 1

## 2022-06-21 NOTE — ED Notes (Signed)
Pt given lunch

## 2022-06-21 NOTE — Consult Note (Addendum)
[facility] Face-to-Face Psychiatry Consult   Reason for Consult:  Admit Referring Physician:  Dr. Trinna Post Patient Identification: Barbara Benton MRN:  [id number] Principal Diagnosis: <principal problem not specified> Diagnosis:  Active Problems:   Suicidal ideation  Total Time spent with patient: 30 minutes  Subjective:   Barbara Benton is a 27 y.o. female patient admitted with evaluation of suicidal ideation. Per chart review, history of depression, anxiety, bipolar, adhd, odd, gerd. Prior documentation suggests pt has had prior presentations to the ED possibly for secondary gain.  HPI:    Pt seen at bedside, reports she came to the ED because she couldn't breathe. Objectively, no noted respiratory distress. She denies suicidal, homicidal or violent ideations. She denies auditory visual hallucinations or paranoia. Objectively, there is no evidence of agitation, aggression, distractibility or internal preoccupation. No delusions or paranoia elicited. Pt psychiatrically cleared.  Past Psychiatric History: per chart review, history of depression, anxiety, bipolar, adhd, odd  Risk to Self: No Risk to Others: No Prior Inpatient Therapy: Yes Prior Outpatient Therapy: None reported  Past Medical History:  Past Medical History:  Diagnosis Date   ADHD (attention deficit hyperactivity disorder)    Anxiety    Bipolar disorder (HCC)    Depression    GERD (gastroesophageal reflux disease)    Hypertension    ODD (oppositional defiant disorder)     Past Surgical History:  Procedure Laterality Date   NO PAST SURGERIES     Family History:  Family History  Problem Relation Age of Onset   Diabetes Maternal Grandmother    Cancer Paternal Grandfather        type unknown   Family Psychiatric  History: None reported Social History:  Social History   Substance and Sexual Activity  Alcohol Use Yes   Comment: occ     Social History   Substance and Sexual Activity  Drug Use Not Currently    Types: Marijuana, MDMA Chiropodist)    Social History   Socioeconomic History   Marital status: Single    Spouse name: Not on file   Number of children: 0   Years of education: Not on file   Highest education level: Not on file  Occupational History   Occupation: Advertising account planner  Tobacco Use   Smoking status: Every Day    Packs/day: .5    Types: Cigarettes   Smokeless tobacco: Never  Vaping Use   Vaping Use: Some days  Substance and Sexual Activity   Alcohol use: Yes    Comment: occ   Drug use: Not Currently    Types: Marijuana, MDMA (Ecstacy)   Sexual activity: Yes    Birth control/protection: Condom  Other Topics Concern   Not on file  Social History Narrative   Client lives alone; stated that she is on disability but getting a job.  Stated that she does not have a Therapist, sports.   Social Determinants of Health   Financial Resource Strain: Not on file  Food Insecurity: Not on file  Transportation Needs: Not on file  Physical Activity: Not on file  Stress: Not on file  Social Connections: Not on file   Additional Social History:    Allergies:  No Known Allergies  Labs:  Results for orders placed or performed during the hospital encounter of 06/20/22 (from the past 48 hour(s))  Comprehensive metabolic panel     Status: Abnormal   Collection Time: 06/20/22  5:10 PM  Result Value Ref Range   Sodium 139 135 - 145 mmol/L  Potassium 3.3 (L) 3.5 - 5.1 mmol/L   Chloride 106 98 - 111 mmol/L   CO2 24 22 - 32 mmol/L   Glucose, Bld 114 (H) 70 - 99 mg/dL    Comment: Glucose reference range applies only to samples taken after fasting for at least 8 hours.   BUN 8 6 - 20 mg/dL   Creatinine, Ser 1.61 0.44 - 1.00 mg/dL   Calcium 9.4 8.9 - 09.6 mg/dL   Total Protein 7.9 6.5 - 8.1 g/dL   Albumin 4.0 3.5 - 5.0 g/dL   AST 32 15 - 41 U/L   ALT 30 0 - 44 U/L   Alkaline Phosphatase 88 38 - 126 U/L   Total Bilirubin 0.5 0.3 - 1.2 mg/dL   GFR, Estimated >04 >54 mL/min     Comment: (NOTE) Calculated using the CKD-EPI Creatinine Equation (2021)    Anion gap 9 5 - 15    Comment: Performed at [facility], [address] [place], [place] [address]  Ethanol     Status: None   Collection Time: 06/20/22  5:10 PM  Result Value Ref Range   Alcohol, Ethyl (B) <10 <10 mg/dL    Comment: (NOTE) Lowest detectable limit for serum alcohol is 10 mg/dL.  For medical purposes only. Performed at [facility], [address] [place], [place] [address]   Salicylate level     Status: Abnormal   Collection Time: 06/20/22  5:10 PM  Result Value Ref Range   Salicylate Lvl <7.0 (L) 7.0 - 30.0 mg/dL    Comment: Performed at [facility], [address] [place], [place] [address]  Acetaminophen level     Status: Abnormal   Collection Time: 06/20/22  5:10 PM  Result Value Ref Range   Acetaminophen (Tylenol), Serum <10 (L) 10 - 30 ug/mL    Comment: (NOTE) Therapeutic concentrations vary significantly. A range of 10-30 ug/mL  may be an effective concentration for many patients. However, some  are best treated at concentrations outside of this range. Acetaminophen concentrations >150 ug/mL at 4 hours after ingestion  and >50 ug/mL at 12 hours after ingestion are often associated with  toxic reactions.  Performed at [facility], [address] [place], [place] [address]   cbc     Status: Abnormal   Collection Time: 06/20/22  5:10 PM  Result Value Ref Range   WBC 8.7 4.0 - 10.5 K/uL   RBC 4.53 3.87 - 5.11 MIL/uL   Hemoglobin 11.9 (L) 12.0 - 15.0 g/dL   HCT 57.8 46.9 - 62.9 %   MCV 83.7 80.0 - 100.0 fL   MCH 26.3 26.0 - 34.0 pg   MCHC 31.4 30.0 - 36.0 g/dL   RDW 52.8 41.3 - 24.4 %   Platelets 398 150 - 400 K/uL   nRBC 0.0 0.0 - 0.2 %    Comment: Performed at [facility], [address] [place], [place] [address]  Urine Drug Screen, Qualitative     Status: None   Collection Time: 06/20/22  5:12 PM  Result Value  Ref Range   Tricyclic, Ur Screen NONE DETECTED NONE DETECTED   Amphetamines, Ur Screen NONE DETECTED NONE DETECTED   MDMA (Ecstasy)Ur Screen NONE DETECTED NONE DETECTED   Cocaine Metabolite,Ur Remy NONE DETECTED NONE DETECTED   Opiate, Ur Screen NONE DETECTED NONE DETECTED   Phencyclidine (PCP) Ur S NONE DETECTED NONE DETECTED   Cannabinoid 50 Ng, Ur  NONE DETECTED NONE DETECTED  Barbiturates, Ur Screen NONE DETECTED NONE DETECTED   Benzodiazepine, Ur Scrn NONE DETECTED NONE DETECTED   Methadone Scn, Ur NONE DETECTED NONE DETECTED    Comment: (NOTE) Tricyclics + metabolites, urine    Cutoff 1000 ng/mL Amphetamines + metabolites, urine  Cutoff 1000 ng/mL MDMA (Ecstasy), urine              Cutoff 500 ng/mL Cocaine Metabolite, urine          Cutoff 300 ng/mL Opiate + metabolites, urine        Cutoff 300 ng/mL Phencyclidine (PCP), urine         Cutoff 25 ng/mL Cannabinoid, urine                 Cutoff 50 ng/mL Barbiturates + metabolites, urine  Cutoff 200 ng/mL Benzodiazepine, urine              Cutoff 200 ng/mL Methadone, urine                   Cutoff 300 ng/mL  The urine drug screen provides only a preliminary, unconfirmed analytical test result and should not be used for non-medical purposes. Clinical consideration and professional judgment should be applied to any positive drug screen result due to possible interfering substances. A more specific alternate chemical method must be used in order to obtain a confirmed analytical result. Gas chromatography / mass spectrometry (GC/MS) is the preferred confirm atory method. Performed at [facility], [address] [place], [place] [address]   POC urine preg, ED     Status: None   Collection Time: 06/20/22  5:24 PM  Result Value Ref Range   Preg Test, Ur Negative Negative    No current facility-administered medications for this encounter.   Current Outpatient Medications  Medication Sig Dispense Refill   cetirizine  (ZYRTEC) 10 MG tablet Take 1 tablet (10 mg total) by mouth daily. 30 tablet 0   clobetasol cream (TEMOVATE) 0.05 % Apply 1 Application topically 2 (two) times daily.     famotidine (PEPCID) 20 MG tablet Take 1 tablet (20 mg total) by mouth 2 (two) times daily for 20 days. 40 tablet 0   ARAZLO 0.045 % LOTN Apply 1 Application topically in the morning. (Patient not taking: Reported on 05/10/2022)     atomoxetine (STRATTERA) 18 MG capsule Take 18 mg by mouth daily. (Patient not taking: Reported on 03/16/2022)     Azelaic Acid 15 % gel Apply 1 Application topically every morning. (Patient not taking: Reported on 05/10/2022)     Dyclonine-Glycerin (CEPACOL SORE THROAT SPRAY) 0.1-33 % LIQD Use as directed 1 spray in the mouth or throat 2 (two) times daily as needed (sore throat). (Patient not taking: Reported on 06/20/2022) 118 mL 0   fluticasone (FLONASE) 50 MCG/ACT nasal spray Place 1 spray into both nostrils daily. (Patient not taking: Reported on 05/10/2022)     guanFACINE (INTUNIV) 1 MG TB24 ER tablet Take 1 mg by mouth daily. (Patient not taking: Reported on 05/10/2022)     lamoTRIgine (LAMICTAL) 100 MG tablet Take 100 mg by mouth 2 (two) times daily. (Patient not taking: Reported on 05/10/2022)     loratadine (CLARITIN) 10 MG tablet Take 1 tablet (10 mg total) by mouth daily as needed for allergies. (Patient not taking: Reported on 05/10/2022) 30 tablet 6   Multiple Vitamin (MULTIVITAMIN) TABS Take 1 tablet by mouth daily. (Patient not taking: Reported on 05/10/2022)     nystatin (MYCOSTATIN) 100000 UNIT/ML  suspension Take 5 mLs by mouth 4 (four) times daily. (Patient not taking: Reported on 05/10/2022)     omeprazole (PRILOSEC) 40 MG capsule Take 1 capsule (40 mg total) by mouth daily as needed (heartburn). (Patient not taking: Reported on 05/10/2022) 30 capsule 3   phentermine (ADIPEX-P) 37.5 MG tablet Take 37.5 mg by mouth daily. (Patient not taking: Reported on 05/10/2022)     sertraline (ZOLOFT) 50 MG tablet  Take 50 mg by mouth daily. (Patient not taking: Reported on 05/10/2022)     triamcinolone cream (KENALOG) 0.1 % Apply 1 Application topically 4 (four) times daily. (Patient not taking: Reported on 05/10/2022) 30 g 0   VRAYLAR 4.5 MG CAPS Take 1 capsule by mouth daily. (Patient not taking: Reported on 06/20/2022)     XENICAL 120 MG capsule Take 120 mg by mouth 3 (three) times daily. (Patient not taking: Reported on 05/10/2022)      Musculoskeletal: Strength & Muscle Tone:  pt lying down on assessment Gait & Station:  pt  lying down on assessment Patient leans: pt lying down on assessment  Psychiatric Specialty Exam:  Presentation  General Appearance:  Disheveled; Appropriate for Environment; Other (comment) (in scrubs)  Eye Contact: Minimal  Speech: Clear and Coherent; Normal Rate  Speech Volume: Normal  Handedness: Right   Mood and Affect  Mood: Euthymic  Affect: Flat   Thought Process  Thought Processes: Coherent  Descriptions of Associations:Intact  Orientation:Full (Time, Place and Person)  Thought Content:Logical; WDL  History of Schizophrenia/Schizoaffective disorder:Yes  Duration of Psychotic Symptoms:N/A  Hallucinations:Hallucinations: None  Ideas of Reference:None  Suicidal Thoughts:Suicidal Thoughts: No  Homicidal Thoughts:Homicidal Thoughts: No   Sensorium  Memory: Immediate Fair  Judgment: Intact  Insight: Present   Executive Functions  Concentration: Fair  Attention Span: Fair  Recall: Fair  Fund of Knowledge: Fair  Language: Fair   Psychomotor Activity  Psychomotor Activity:Psychomotor Activity: Normal   Assets  Assets: Communication Skills; Desire for Improvement; Resilience   Sleep  Sleep:Sleep: Fair   Physical Exam: Physical Exam Constitutional:      General: She is not in acute distress.    Appearance: She is obese. She is not ill-appearing, toxic-appearing or diaphoretic.  Eyes:     General: No  scleral icterus. Cardiovascular:     Rate and Rhythm: Tachycardia present.  Pulmonary:     Effort: Pulmonary effort is normal. No respiratory distress.  Neurological:     Mental Status: She is alert and oriented to person, place, and time.  Psychiatric:        Attention and Perception: She does not perceive auditory or visual hallucinations.        Mood and Affect: Mood normal. Affect is flat.        Speech: Speech is delayed.        Behavior: Behavior is slowed. Behavior is cooperative.        Thought Content: Thought content normal.    Review of Systems  Constitutional:  Negative for chills and fever.  Respiratory:  Positive for shortness of breath.   Cardiovascular:  Negative for chest pain and palpitations.   Blood pressure 126/82, pulse (!) 103, temperature 98 F (36.7 C), temperature source Oral, resp. rate 17, weight 108 kg, last menstrual period 06/06/2022, SpO2 98 %. Body mass index is 46.5 kg/m.  Treatment Plan Summary: Plan pt psych cleared. Follow up with outpatient behavioral health. Have requested outpatient behavioral health resources and shelter resources be provided to pt at discharge.  Disposition:  No evidence of imminent risk to self or others at present.   Patient does not meet criteria for psychiatric inpatient admission.  Lauree Chandler, NP 06/21/2022 1:14 PM

## 2022-06-21 NOTE — ED Notes (Signed)
This EDT gave patient shower supplies including soap, deodorant, toothbrush, toothpaste, scrubs, underwear, and socks. Patient showering at this time.

## 2022-06-21 NOTE — ED Notes (Signed)
Shower supplies disposed of appropriately.

## 2022-06-21 NOTE — ED Notes (Signed)
Patient redirected multiple times to stay in room. Patient continues to come out of room and stand in hallway

## 2022-06-21 NOTE — ED Notes (Signed)
VOL/NP Recommends obs.overnight/reassess in Am

## 2022-06-21 NOTE — ED Provider Notes (Signed)
Emergency Medicine Observation Re-evaluation Note  Barbara Benton is a 27 y.o. female, seen on rounds today.  Pt initially presented to the ED for complaints of No chief complaint on file. Currently, the patient is resting, voices no medical complaints.  Physical Exam  BP (!) 160/81 (BP Location: Right Arm)   Pulse (!) 102   Temp 98.9 F (37.2 C) (Oral)   Resp 20   Wt 108 kg   LMP 06/06/2022 (Approximate)   SpO2 100%   BMI 46.50 kg/m  Physical Exam General: Resting in no acute distress Cardiac: No cyanosis Lungs: Equal rise and fall Psych: Not agitated  ED Course / MDM  EKG:   I have reviewed the labs performed to date as well as medications administered while in observation.  Recent changes in the last 24 hours include patient up and out of her room most of the night.  Received Ativan for sleep.  Expressed desire to leave.  [facility] psych NP contacted who does not desire patient to leave.  Recommended patient given choice to stay voluntarily versus IVC; patient agrees to stay.  Plan  Current plan is for psychiatric reassessment this morning.    Irean Hong, MD 06/21/22 [phone]

## 2022-06-21 NOTE — ED Notes (Signed)
Patient given [facility] resources information per TTS [facility]. Patient given phone to call resources prior to discharge.

## 2022-06-21 NOTE — ED Notes (Signed)
Patient requesting to leave.  EDP Dr Dolores Frame made aware and Psych NP Sindy Guadeloupe.

## 2022-06-22 ENCOUNTER — Emergency Department
Admission: EM | Admit: 2022-06-22 | Discharge: 2022-06-22 | Disposition: A | Discharge status: Home / Self Care | Payer: Medicaid Other | Attending: Emergency Medicine | Admitting: Emergency Medicine

## 2022-06-22 ENCOUNTER — Emergency Department
Admission: EM | Admit: 2022-06-22 | Discharge: 2022-06-22 | Payer: Medicaid Other | Source: Home / Self Care | Attending: Emergency Medicine | Admitting: Emergency Medicine

## 2022-06-22 ENCOUNTER — Encounter: Payer: Self-pay | Admitting: Emergency Medicine

## 2022-06-22 DIAGNOSIS — Z139 Encounter for screening, unspecified: Secondary | ICD-10-CM | POA: Insufficient documentation

## 2022-06-22 DIAGNOSIS — Z765 Malingerer [conscious simulation]: Secondary | ICD-10-CM | POA: Insufficient documentation

## 2022-06-22 DIAGNOSIS — R45851 Suicidal ideations: Secondary | ICD-10-CM | POA: Diagnosis not present

## 2022-06-22 DIAGNOSIS — R1030 Lower abdominal pain, unspecified: Secondary | ICD-10-CM | POA: Diagnosis present

## 2022-06-22 LAB — CBC WITH DIFFERENTIAL/PLATELET
Abs Immature Granulocytes: 0.02 10*3/uL (ref 0.00–0.07)
Basophils Absolute: 0 10*3/uL (ref 0.0–0.1)
Basophils Relative: 0 %
Eosinophils Absolute: 0.1 10*3/uL (ref 0.0–0.5)
Eosinophils Relative: 1 %
HCT: 38.4 % (ref 36.0–46.0)
Hemoglobin: 11.8 g/dL — ABNORMAL LOW (ref 12.0–15.0)
Immature Granulocytes: 0 %
Lymphocytes Relative: 26 %
Lymphs Abs: 2.7 10*3/uL (ref 0.7–4.0)
MCH: 26 pg (ref 26.0–34.0)
MCHC: 30.7 g/dL (ref 30.0–36.0)
MCV: 84.6 fL (ref 80.0–100.0)
Monocytes Absolute: 0.6 10*3/uL (ref 0.1–1.0)
Monocytes Relative: 6 %
Neutro Abs: 6.8 10*3/uL (ref 1.7–7.7)
Neutrophils Relative %: 67 %
Platelets: 385 10*3/uL (ref 150–400)
RBC: 4.54 MIL/uL (ref 3.87–5.11)
RDW: 14.7 % (ref 11.5–15.5)
WBC: 10.3 10*3/uL (ref 4.0–10.5)
nRBC: 0 % (ref 0.0–0.2)

## 2022-06-22 LAB — LIPASE, BLOOD: Lipase: 37 U/L (ref 11–51)

## 2022-06-22 LAB — COMPREHENSIVE METABOLIC PANEL
ALT: 27 U/L (ref 0–44)
AST: 21 U/L (ref 15–41)
Albumin: 4.1 g/dL (ref 3.5–5.0)
Alkaline Phosphatase: 89 U/L (ref 38–126)
Anion gap: 9 (ref 5–15)
BUN: 14 mg/dL (ref 6–20)
CO2: 27 mmol/L (ref 22–32)
Calcium: 9.6 mg/dL (ref 8.9–10.3)
Chloride: 104 mmol/L (ref 98–111)
Creatinine, Ser: 0.77 mg/dL (ref 0.44–1.00)
GFR, Estimated: >60 mL/min (ref >60)
Glucose, Bld: 106 mg/dL — ABNORMAL HIGH (ref 70–99)
Potassium: 4.1 mmol/L (ref 3.5–5.1)
Sodium: 140 mmol/L (ref 135–145)
Total Bilirubin: 0.4 mg/dL (ref 0.3–1.2)
Total Protein: 8.1 g/dL (ref 6.5–8.1)

## 2022-06-22 LAB — URINALYSIS, ROUTINE W REFLEX MICROSCOPIC
Bilirubin Urine: NEGATIVE
Glucose, UA: NEGATIVE mg/dL
Hgb urine dipstick: NEGATIVE
Ketones, ur: NEGATIVE mg/dL
Leukocytes,Ua: NEGATIVE
Nitrite: NEGATIVE
Protein, ur: NEGATIVE mg/dL
Specific Gravity, Urine: 1.016 (ref 1.005–1.030)
pH: 6 (ref 5.0–8.0)

## 2022-06-22 LAB — POC URINE PREG, ED: Preg Test, Ur: NEGATIVE

## 2022-06-22 MED ORDER — DIPHENHYDRAMINE HCL 25 MG PO CAPS
50.0000 mg | ORAL_CAPSULE | Freq: Once | ORAL | Status: AC
  Administered 2022-06-22: 50 mg via ORAL
  Filled 2022-06-22: qty 2

## 2022-06-22 NOTE — ED Notes (Signed)
Pt screaming and flailing while being arrested.

## 2022-06-22 NOTE — ED Notes (Signed)
Police officer at bedside talking to pt. Pt still refusing to get up and raising voice at police officer.

## 2022-06-22 NOTE — ED Triage Notes (Signed)
Pt presents ambulatory to triage via [facility] with complaints of abdominal pain and insomnia. Pt notes she hasn't slept in 10 days, however was just D/C yesterday. Pt describes the pain as sharp in her lower abdomen. A&Ox4 at this time. Denies SI/HI, CP or SOB.

## 2022-06-22 NOTE — ED Notes (Signed)
Pt being arrested for trespassing.

## 2022-06-22 NOTE — ED Provider Notes (Signed)
[facility] Provider Note    Event Date/Time   First MD Initiated Contact with Patient 06/22/22 [id number]     (approximate)   History   Abdominal Pain and Insomnia   HPI  Barbara Benton is a 27 y.o. female who presents to the ED for evaluation of Abdominal Pain and Insomnia   I review ED visit from last night and associated psych consult the night of 6/5 and again morning of 6/6.  Patient was discharged about 12 hours ago after spending the night with [place].  Seen for suicidal ideations.  She is a history of depression, anxiety, bipolar disorder.  She was cleared by psych and discharged No documented surgical history.  Does have a history of GERD.  Patient reports that she is here for abdominal pain, or poison ivy, or shortness of breath, or insomnia, or fact that she is hungry.  She explicitly says that she "cannot make up her mind" regarding what her chief complaint is.  She is asking if she can sleep in the room overnight and is asking for some food.  Physical Exam   Triage Vital Signs: ED Triage Vitals  Enc Vitals Group     BP 06/22/22 0120 (!) 146/94     Pulse Rate 06/22/22 0120 98     Resp 06/22/22 0120 18     Temp 06/22/22 0120 98.2 F (36.8 C)     Temp Source 06/22/22 0120 Oral     SpO2 06/22/22 0120 98 %     Weight 06/22/22 0121 238 lb (108 kg)     Height 06/22/22 0121 5' (1.524 m)     Head Circumference --      Peak Flow --      Pain Score 06/22/22 0121 10     Pain Loc --      Pain Edu? --      Excl. in GC? --     Most recent vital signs: Vitals:   06/22/22 0120  BP: (!) 146/94  Pulse: 98  Resp: 18  Temp: 98.2 F (36.8 C)  SpO2: 98%    General: Awake, no distress.  CV:  Good peripheral perfusion.  Resp:  Normal effort.  Abd:  No distention.  Soft and benign MSK:  No deformity noted.  Neuro:  No focal deficits appreciated. Other:     ED Results / Procedures / Treatments   Labs (all labs ordered are listed, but only abnormal  results are displayed) Labs Reviewed  COMPREHENSIVE METABOLIC PANEL - Abnormal; Notable for the following components:      Result Value   Glucose, Bld 106 (*)    All other components within normal limits  URINALYSIS, ROUTINE W REFLEX MICROSCOPIC - Abnormal; Notable for the following components:   Color, Urine YELLOW (*)    APPearance CLEAR (*)    All other components within normal limits  CBC WITH DIFFERENTIAL/PLATELET - Abnormal; Notable for the following components:   Hemoglobin 11.8 (*)    All other components within normal limits  LIPASE, BLOOD  POC URINE PREG, ED    EKG   RADIOLOGY   Official radiology report(s): No results found.  PROCEDURES and INTERVENTIONS:  Procedures  Medications  diphenhydrAMINE (BENADRYL) capsule 50 mg (has no administration in time range)     IMPRESSION / MDM / ASSESSMENT AND PLAN / ED COURSE  I reviewed the triage vital signs and the nursing notes.  Differential diagnosis includes, but is not limited to, polysubstance abuse, malingering,  appendicitis, UTI  Patient presents with various chief complaints that I suspect is all just related to secondary gain and malingering.  She has a normal exam.  I see no evidence of psychiatric emergency.  Reassuring blood work and I see no evidence for cross-sectional imaging or further diagnostics.  Will provide some Benadryl, food and discharged the patient to the lobby until morning.      FINAL CLINICAL IMPRESSION(S) / ED DIAGNOSES   Final diagnoses:  Malingering     Rx / DC Orders   ED Discharge Orders     None        Note:  This document was prepared using Dragon voice recognition software and may include unintentional dictation errors.   Delton Prairie, MD 06/22/22 [phone]

## 2022-06-22 NOTE — ED Triage Notes (Signed)
Pt was sleeping in the lobby of the ED. Pt was asked to leave by security. Pt states she needs her blood pressure taken and then patient states she had a seizure but she didn't tell anyone. Pt then continues to states she has poison Ivy from being in the street and "I am shaking and breathing".

## 2022-06-22 NOTE — ED Provider Notes (Addendum)
[facility] Provider Note    Event Date/Time   First MD Initiated Contact with Patient 06/22/22 [phone]     (approximate)   History   Seizures   HPI  Barbara Benton is a 27 y.o. female with a history of depression, anxiety, bipolar disorder, currently being evaluated by neurology for seizures, who presents for multiple ongoing complaints.  Per the triage RN note, the patient has been sleeping in the lobby of the ED after being discharged earlier.  When she was asked to leave by security, she re-registered as a patient.   The patient states that she is currently having a seizure as we speak, and that she has poison ivy, high blood pressure and insomnia.  The patient states that after she was discharged a few hours ago and told she could stay in the lobby, she states "all I got was to Benadryl" and that she needs "some medication" for "something."  When I pointed out to the patient that she had already had an evaluation earlier this morning and was cleared by the physician, she states that "they told me I could stay here."  I advised her that we could not keep her in the ED to sleep indefinitely but were happy to provide her with a list of shelter resources.  She stated "I don't need a shelter, I need medication" and then again listed the same multiple complaints.   I reviewed the past medical records including ED provider Dr. Michaelle Copas note from earlier this morning.  The patient was discharged at 32.  She had a lab workup earlier this morning including CMP, lipase, CBC, and UA all of which showed no acute findings.  She was seen by Dr. Malvin Johns from neurology on 5//1 after referral from the ED for seizure-like episodes.  She is scheduled for an EEG and MRI.  She was also evaluated by NP Nedra Hai from psychiatry 24 hours ago in the ED and was cleared for discharge at that time.   Physical Exam   Triage Vital Signs: ED Triage Vitals [06/22/22 0818]  Enc Vitals Group     BP (!)  151/92     Pulse Rate 92     Resp 18     Temp (!) 97.5 F (36.4 C)     Temp Source Oral     SpO2 100 %     Weight 235 lb 14.3 oz (107 kg)     Height      Head Circumference      Peak Flow      Pain Score 0     Pain Loc      Pain Edu?      Excl. in GC?     Most recent vital signs: Vitals:   06/22/22 0818  BP: (!) 151/92  Pulse: 92  Resp: 18  Temp: (!) 97.5 F (36.4 C)  SpO2: 100%     General: Awake, no distress.  CV:  Good peripheral perfusion.  Resp:  Normal effort.  Abd:  No distention.  Other:  Motor intact in all extremities.  Normal coordination.  Normal speech.  No shaking, convulsions, or seizure-like activity.   ED Results / Procedures / Treatments   Labs (all labs ordered are listed, but only abnormal results are displayed) Labs Reviewed - No data to display   EKG    RADIOLOGY    PROCEDURES:  Critical Care performed: No  Procedures   MEDICATIONS ORDERED IN ED: Medications - No data  to display   IMPRESSION / MDM / ASSESSMENT AND PLAN / ED COURSE  I reviewed the triage vital signs and the nursing notes.  27 year old female with PMH as noted above with multiple ED visits over the last several weeks, just discharged 3 hours ago, presents reporting multiple complaints which were the same as the complaints she reported during her ED visit earlier this morning, except she is no longer reporting abdominal pain.  She was allowed to sleep in the lobby until the morning but then re-registered as a patient only when asked by security to leave.  The patient is overall uncooperative during exam.  She is alert, speaking clearly, moving all extremities, and does not demonstrate any shaking or seizure activity.  She does not appear to be hallucinating, experiencing delusions, or responding to internal stimuli.  There is no evidence of acute psychosis.  At this time there is no evidence of any acute change in her clinical status since she was discharged 3  hours ago.  Differential diagnosis includes, but is not limited to, malingering.  Based on my medical screening exam, there is no evidence of an emergency medical condition.  Patient is stable for discharge at this time with outpatient follow-up.  After I advised the patient that there was no evidence of any change in her clinical status and no indication for repeat workup or any further ED treatment, she threatened to call the police.  She will be provided a list of shelter resources.     FINAL CLINICAL IMPRESSION(S) / ED DIAGNOSES   Final diagnoses:  Encounter for medical screening examination     Rx / DC Orders   ED Discharge Orders     None        Note:  This document was prepared using Dragon voice recognition software and may include unintentional dictation errors.        Dionne Bucy, MD 06/22/22 [phone]

## 2022-06-22 NOTE — ED Notes (Addendum)
Charge RN, Barbee Cough, RN, Linus Salmons, MD and security at bedside. Pt refusing to leave facility stating that "I'm having a seizure". MD stated to patient that she has been seen and medically cleared x 2 today. Pt states that "I  just want the police called, because Im not leaving". Simona Huh with [facility] came to bedside, pt was placed in handcuffs in the bed and then threw herself to the floor, pt was arrested for trespassing. Not did not sustain any injury from throwing self to floor. Pt did not hit head, landed on buttocks. Pt was placed into wheelchair and [facility] unit was called to take patient to jail. This RN escorted pt out in wheel chair through the ambulance bay to wait on SD. Pt had no signs of distress while waiting on SD. Pt remained A/O x 4.
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
## 2022-06-22 NOTE — ED Notes (Signed)
Pt given sandwich tray and sprite.  

## 2022-06-22 NOTE — Discharge Instructions (Signed)
Follow up with your mental health provider and neurologist as scheduled.

## 2022-08-01 ENCOUNTER — Emergency Department
Admission: EM | Admit: 2022-08-01 | Discharge: 2022-08-02 | Disposition: A | Discharge status: Other Acute Inpatient Hospital | Payer: MEDICAID | Attending: Emergency Medicine | Admitting: Emergency Medicine

## 2022-08-01 ENCOUNTER — Other Ambulatory Visit: Payer: Self-pay

## 2022-08-01 DIAGNOSIS — Z79899 Other long term (current) drug therapy: Secondary | ICD-10-CM | POA: Diagnosis not present

## 2022-08-01 DIAGNOSIS — Z91148 Patient's other noncompliance with medication regimen for other reason: Secondary | ICD-10-CM

## 2022-08-01 DIAGNOSIS — F22 Delusional disorders: Secondary | ICD-10-CM | POA: Diagnosis not present

## 2022-08-01 DIAGNOSIS — F250 Schizoaffective disorder, bipolar type: Secondary | ICD-10-CM | POA: Diagnosis not present

## 2022-08-01 DIAGNOSIS — R45851 Suicidal ideations: Secondary | ICD-10-CM | POA: Insufficient documentation

## 2022-08-01 DIAGNOSIS — Z20822 Contact with and (suspected) exposure to covid-19: Secondary | ICD-10-CM | POA: Insufficient documentation

## 2022-08-01 DIAGNOSIS — R456 Violent behavior: Secondary | ICD-10-CM | POA: Diagnosis present

## 2022-08-01 DIAGNOSIS — R4689 Other symptoms and signs involving appearance and behavior: Secondary | ICD-10-CM

## 2022-08-01 LAB — COMPREHENSIVE METABOLIC PANEL
ALT: 21 U/L (ref 0–44)
AST: 21 U/L (ref 15–41)
Albumin: 3.5 g/dL (ref 3.5–5.0)
Alkaline Phosphatase: 76 U/L (ref 38–126)
Anion gap: 7 (ref 5–15)
BUN: 13 mg/dL (ref 6–20)
CO2: 23 mmol/L (ref 22–32)
Calcium: 8.9 mg/dL (ref 8.9–10.3)
Chloride: 106 mmol/L (ref 98–111)
Creatinine, Ser: 0.79 mg/dL (ref 0.44–1.00)
GFR, Estimated: >60 mL/min (ref >60)
Glucose, Bld: 90 mg/dL (ref 70–99)
Potassium: 3.6 mmol/L (ref 3.5–5.1)
Sodium: 136 mmol/L (ref 135–145)
Total Bilirubin: 0.5 mg/dL (ref 0.3–1.2)
Total Protein: 7 g/dL (ref 6.5–8.1)

## 2022-08-01 LAB — CBC
HCT: 35.6 % — ABNORMAL LOW (ref 36.0–46.0)
Hemoglobin: 11.3 g/dL — ABNORMAL LOW (ref 12.0–15.0)
MCH: 25.5 pg — ABNORMAL LOW (ref 26.0–34.0)
MCHC: 31.7 g/dL (ref 30.0–36.0)
MCV: 80.2 fL (ref 80.0–100.0)
Platelets: 340 10*3/uL (ref 150–400)
RBC: 4.44 MIL/uL (ref 3.87–5.11)
RDW: 15.7 % — ABNORMAL HIGH (ref 11.5–15.5)
WBC: 5.4 10*3/uL (ref 4.0–10.5)
nRBC: 0 % (ref 0.0–0.2)

## 2022-08-01 LAB — URINE DRUG SCREEN, QUALITATIVE (ARMC ONLY)
Amphetamines, Ur Screen: NOT DETECTED
Barbiturates, Ur Screen: NOT DETECTED
Benzodiazepine, Ur Scrn: NOT DETECTED
Cannabinoid 50 Ng, Ur ~~LOC~~: NOT DETECTED
Cocaine Metabolite,Ur ~~LOC~~: NOT DETECTED
MDMA (Ecstasy)Ur Screen: NOT DETECTED
Methadone Scn, Ur: NOT DETECTED
Opiate, Ur Screen: NOT DETECTED
Phencyclidine (PCP) Ur S: NOT DETECTED
Tricyclic, Ur Screen: NOT DETECTED

## 2022-08-01 LAB — SARS CORONAVIRUS 2 BY RT PCR: SARS Coronavirus 2 by RT PCR: NEGATIVE

## 2022-08-01 LAB — ETHANOL: Alcohol, Ethyl (B): <10 mg/dL (ref <10)

## 2022-08-01 LAB — POC URINE PREG, ED: Preg Test, Ur: NEGATIVE

## 2022-08-01 MED ORDER — LAMOTRIGINE 100 MG PO TABS
100.0000 mg | ORAL_TABLET | Freq: Two times a day (BID) | ORAL | Status: DC
  Administered 2022-08-01: 100 mg via ORAL
  Filled 2022-08-01: qty 1

## 2022-08-01 MED ORDER — GUANFACINE HCL ER 1 MG PO TB24
1.0000 mg | ORAL_TABLET | Freq: Every day | ORAL | Status: DC
  Administered 2022-08-01: 1 mg via ORAL
  Filled 2022-08-01: qty 1

## 2022-08-01 MED ORDER — HYDROXYZINE HCL 25 MG PO TABS: 50.0000 mg | ORAL_TABLET | Freq: Three times a day (TID) | ORAL | Status: DC | PRN

## 2022-08-01 MED ORDER — TRAZODONE HCL 50 MG PO TABS: 50.0000 mg | ORAL_TABLET | Freq: Every evening | ORAL | Status: DC | PRN

## 2022-08-01 NOTE — ED Triage Notes (Signed)
Patient states she was prescribed Seroquel and Risperdal when [facility] was at [facility], states "I do not need those.  I do not take those at all."

## 2022-08-01 NOTE — ED Notes (Signed)
Snack and drink given. PT consumed 100% 

## 2022-08-01 NOTE — ED Notes (Signed)
Report given to Cyndia Bent RN from [facility] - pt bed ready on 7/18 at 10am.

## 2022-08-01 NOTE — ED Notes (Signed)
INVOLUNTARY with all papers on chart/awaiting TTS/PSYCH consult ?

## 2022-08-01 NOTE — ED Notes (Signed)
Pt given dinner tray and beverage  

## 2022-08-01 NOTE — BH Assessment (Signed)
PATIENT BED AVAILABLE AFTER 10AM ON 08/02/22  Patient has been accepted to [facility]  Patient assigned to [facility] Accepting physician is Dr. Shanon Payor.  Call report to [phone].  Representative was [employer].   ER Staff is aware of it:  Nitchia ER Secretary  Dr. Trinna Post, ER MD  Dorian Patient's Nurse   Address: [address] [place]

## 2022-08-01 NOTE — ED Notes (Signed)
Pt Belongings:  Pair of blue sandals Floral leggings Under ware Grey tee shirt Cell phone Turquoise bra Pony tail holder - elastic -- 3 of them
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
## 2022-08-01 NOTE — BH Assessment (Signed)
Per [facility]), patient to be referred out of system.  Referral information for Psychiatric Hospitalization faxed to;   [facility] [phone]- [phone]) No available beds  [facility] [phone]- [phone]),   [facility] [phone]),  [address] [phone]),   [facility] [phone] -or- [phone]),   [facility] [phone])  [facility] [phone])
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
## 2022-08-01 NOTE — ED Notes (Signed)
Pt accepted at [facility] on 7.[facility].24 can arrive after 10am
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
## 2022-08-02 NOTE — ED Notes (Signed)
Given  breakfast

## 2022-08-02 NOTE — ED Notes (Signed)
ivc/pt accepted to [facility] after 10am on 08/02/22.

## 2022-08-02 NOTE — ED Notes (Signed)
Pt appeared agitated do to hearing door bell to the ambulates bay. Pt go up to the bathroom and slammed the door when closing. Pt back in bed at this time.
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
## 2022-08-02 NOTE — ED Notes (Signed)
Pt was staring at Clinical research associate and was mumbling, Clinical research associate when over to pt and asked if she needed something. Pt stated " can I get some water bitch" writer told pt that she can but to refrain from using foil language towards staff. Pt received water then asked for IM medication.

## 2022-10-12 ENCOUNTER — Emergency Department
Admission: EM | Admit: 2022-10-12 | Discharge: 2022-10-12 | Disposition: A | Discharge status: Home / Self Care | Payer: MEDICAID | Attending: Emergency Medicine | Admitting: Emergency Medicine

## 2022-10-12 ENCOUNTER — Other Ambulatory Visit: Payer: Self-pay

## 2022-10-12 DIAGNOSIS — R7303 Prediabetes: Secondary | ICD-10-CM | POA: Insufficient documentation

## 2022-10-12 DIAGNOSIS — Z7984 Long term (current) use of oral hypoglycemic drugs: Secondary | ICD-10-CM | POA: Diagnosis not present

## 2022-10-12 DIAGNOSIS — Z760 Encounter for issue of repeat prescription: Secondary | ICD-10-CM | POA: Diagnosis present

## 2022-10-12 DIAGNOSIS — Z794 Long term (current) use of insulin: Secondary | ICD-10-CM | POA: Insufficient documentation

## 2022-10-12 LAB — CBG MONITORING, ED: Glucose-Capillary: 116 mg/dL — ABNORMAL HIGH (ref 70–99)

## 2022-10-12 MED ORDER — METFORMIN HCL ER (OSM) 500 MG PO TB24: 500.0000 mg | ORAL_TABLET | Freq: Every day | ORAL | 2 refills | Status: AC

## 2022-10-12 NOTE — ED Triage Notes (Signed)
Pt ambulatory to ER for medication refill for "insulin shot," and metformin. States she's been out for about a month and a half. CBG here 116

## 2022-10-12 NOTE — ED Provider Notes (Signed)
[facility] Provider Note    Event Date/Time   First MD Initiated Contact with Patient 10/12/22 1433     (approximate)   History   Medication Refill   HPI  Barbara Benton is a 27 y.o. female who has history of borderline personality, schizoaffective disorder, ADHD, hypertension presents emergency department with concerns of being out of her metformin and insulin shot.  Patient states she has been on metformin.  Has a dietitian.  Also usually uses a shot.  States she has gained 20 pounds since she was last here.      Physical Exam   Triage Vital Signs: ED Triage Vitals  Encounter Vitals Group     BP 10/12/22 1415 (!) 113/99     Systolic BP Percentile --      Diastolic BP Percentile --      Pulse Rate 10/12/22 1415 97     Resp 10/12/22 1415 18     Temp 10/12/22 1415 99.3 F (37.4 C)     Temp Source 10/12/22 1415 Oral     SpO2 10/12/22 1415 100 %     Weight --      Height 10/12/22 1414 5' (1.524 m)     Head Circumference --      Peak Flow --      Pain Score 10/12/22 1414 3     Pain Loc --      Pain Education --      Exclude from Growth Chart --     Most recent vital signs: Vitals:   10/12/22 1415  BP: (!) 113/99  Pulse: 97  Resp: 18  Temp: 99.3 F (37.4 C)  SpO2: 100%     General: Awake, no distress.   CV:  Good peripheral perfusion. regular rate and  rhythm Resp:  Normal effort.  Abd:  No distention.   Other:      ED Results / Procedures / Treatments   Labs (all labs ordered are listed, but only abnormal results are displayed) Labs Reviewed  CBG MONITORING, ED - Abnormal; Notable for the following components:      Result Value   Glucose-Capillary 116 (*)    All other components within normal limits     EKG     RADIOLOGY     PROCEDURES:   Procedures   MEDICATIONS ORDERED IN ED: Medications - No data to display   IMPRESSION / MDM / ASSESSMENT AND PLAN / ED COURSE  I reviewed the triage vital signs and  the nursing notes.                              Differential diagnosis includes, but is not limited to, diabetes, prediabetes, malingering  Patient's presentation is most consistent with acute, uncomplicated illness.   I do not feel patient needs insulin.  She may benefit from metformin for prediabetes.  May also help with weight loss.  Did explain to her we will not prescribe insulin as her glucose is not high enough.  She is in agreement treatment plan.  Discharged stable condition.  Patient is followed at [facility] clinic.  Therefore cannot review the records.     FINAL CLINICAL IMPRESSION(S) / ED DIAGNOSES   Final diagnoses:  Medication refill     Rx / DC Orders   ED Discharge Orders          Ordered    metformin (FORTAMET) 500 MG (OSM) 24 hr  tablet  Daily with breakfast        10/12/22 1502             Note:  This document was prepared using Dragon voice recognition software and may include unintentional dictation errors.    Faythe Ghee, PA-C 10/12/22 1513    Sharman Cheek, MD 10/12/22 [id number]

## 2023-05-08 ENCOUNTER — Ambulatory Visit: Payer: MEDICAID | Admitting: Dermatology

## 2023-08-15 ENCOUNTER — Inpatient Hospital Stay (HOSPITAL_COMMUNITY): Admission: RE | Admit: 2023-08-15 | Payer: MEDICAID | Source: Home / Self Care | Admitting: Family Medicine
